# Patient Record
Sex: Female | Born: 1970 | Race: White | Hispanic: No | Marital: Married | Smoking: Current every day smoker
Health system: Southern US, Community
[De-identification: ages and names within clinical notes are randomized; demographics above are authoritative.]

## PROBLEM LIST (undated history)

## (undated) DIAGNOSIS — G43019 Migraine without aura, intractable, without status migrainosus: Secondary | ICD-10-CM

## (undated) DIAGNOSIS — I779 Disorder of arteries and arterioles, unspecified: Secondary | ICD-10-CM

## (undated) DIAGNOSIS — K219 Gastro-esophageal reflux disease without esophagitis: Secondary | ICD-10-CM

## (undated) DIAGNOSIS — R51 Headache: Secondary | ICD-10-CM

## (undated) DIAGNOSIS — I739 Peripheral vascular disease, unspecified: Principal | ICD-10-CM

## (undated) DIAGNOSIS — C539 Malignant neoplasm of cervix uteri, unspecified: Secondary | ICD-10-CM

## (undated) DIAGNOSIS — J40 Bronchitis, not specified as acute or chronic: Secondary | ICD-10-CM

## (undated) DIAGNOSIS — E039 Hypothyroidism, unspecified: Secondary | ICD-10-CM

## (undated) DIAGNOSIS — R519 Headache, unspecified: Secondary | ICD-10-CM

## (undated) DIAGNOSIS — G8929 Other chronic pain: Secondary | ICD-10-CM

## (undated) DIAGNOSIS — K274 Chronic or unspecified peptic ulcer, site unspecified, with hemorrhage: Secondary | ICD-10-CM

## (undated) DIAGNOSIS — D497 Neoplasm of unspecified behavior of endocrine glands and other parts of nervous system: Secondary | ICD-10-CM

## (undated) DIAGNOSIS — K284 Chronic or unspecified gastrojejunal ulcer with hemorrhage: Secondary | ICD-10-CM

## (undated) DIAGNOSIS — E785 Hyperlipidemia, unspecified: Secondary | ICD-10-CM

## (undated) DIAGNOSIS — I6521 Occlusion and stenosis of right carotid artery: Secondary | ICD-10-CM

## (undated) DIAGNOSIS — K279 Peptic ulcer, site unspecified, unspecified as acute or chronic, without hemorrhage or perforation: Secondary | ICD-10-CM

## (undated) DIAGNOSIS — I639 Cerebral infarction, unspecified: Secondary | ICD-10-CM

## (undated) DIAGNOSIS — G459 Transient cerebral ischemic attack, unspecified: Secondary | ICD-10-CM

## (undated) HISTORY — PX: ABDOMINAL HYSTERECTOMY: SHX81

## (undated) HISTORY — DX: Headache: R51

## (undated) HISTORY — DX: Malignant neoplasm of cervix uteri, unspecified: C53.9

## (undated) HISTORY — DX: Other chronic pain: G89.29

## (undated) HISTORY — DX: Disorder of arteries and arterioles, unspecified: I77.9

## (undated) HISTORY — DX: Neoplasm of unspecified behavior of endocrine glands and other parts of nervous system: D49.7

## (undated) HISTORY — DX: Peptic ulcer, site unspecified, unspecified as acute or chronic, without hemorrhage or perforation: K27.9

## (undated) HISTORY — DX: Peripheral vascular disease, unspecified: I73.9

## (undated) HISTORY — PX: OTHER SURGICAL HISTORY: SHX169

## (undated) HISTORY — PX: BLADDER SUSPENSION: SHX72

## (undated) HISTORY — DX: Migraine without aura, intractable, without status migrainosus: G43.019

## (undated) HISTORY — DX: Headache, unspecified: R51.9

## (undated) HISTORY — PX: TOTAL VAGINAL HYSTERECTOMY: SHX2548

## (undated) HISTORY — DX: Hyperlipidemia, unspecified: E78.5

---

## 2002-12-05 ENCOUNTER — Inpatient Hospital Stay (HOSPITAL_COMMUNITY): Admission: RE | Admit: 2002-12-05 | Discharge: 2002-12-07 | Payer: Self-pay | Admitting: Obstetrics & Gynecology

## 2003-02-06 ENCOUNTER — Ambulatory Visit (HOSPITAL_COMMUNITY): Admission: RE | Admit: 2003-02-06 | Discharge: 2003-02-06 | Payer: Self-pay | Admitting: Obstetrics & Gynecology

## 2004-05-27 ENCOUNTER — Ambulatory Visit: Payer: Self-pay | Admitting: Occupational Therapy

## 2007-01-17 ENCOUNTER — Ambulatory Visit (HOSPITAL_COMMUNITY): Payer: Self-pay | Admitting: Psychiatry

## 2007-01-24 ENCOUNTER — Ambulatory Visit (HOSPITAL_COMMUNITY): Payer: Self-pay | Admitting: Psychiatry

## 2007-02-02 ENCOUNTER — Ambulatory Visit (HOSPITAL_COMMUNITY): Payer: Self-pay | Admitting: Psychiatry

## 2007-02-10 ENCOUNTER — Ambulatory Visit (HOSPITAL_COMMUNITY): Payer: Self-pay | Admitting: Psychiatry

## 2007-03-04 ENCOUNTER — Ambulatory Visit (HOSPITAL_COMMUNITY): Payer: Self-pay | Admitting: Psychiatry

## 2007-04-26 ENCOUNTER — Ambulatory Visit (HOSPITAL_COMMUNITY): Payer: Self-pay | Admitting: Psychiatry

## 2007-05-03 ENCOUNTER — Ambulatory Visit (HOSPITAL_COMMUNITY): Payer: Self-pay | Admitting: Psychiatry

## 2007-05-05 ENCOUNTER — Ambulatory Visit (HOSPITAL_COMMUNITY): Payer: Self-pay | Admitting: Psychiatry

## 2007-06-30 ENCOUNTER — Ambulatory Visit (HOSPITAL_COMMUNITY): Payer: Self-pay | Admitting: Psychiatry

## 2007-07-05 ENCOUNTER — Ambulatory Visit (HOSPITAL_COMMUNITY): Payer: Self-pay | Admitting: Psychiatry

## 2007-07-07 ENCOUNTER — Ambulatory Visit (HOSPITAL_COMMUNITY): Payer: Self-pay | Admitting: Psychiatry

## 2007-07-14 ENCOUNTER — Ambulatory Visit (HOSPITAL_COMMUNITY): Payer: Self-pay | Admitting: Psychiatry

## 2007-07-27 ENCOUNTER — Ambulatory Visit (HOSPITAL_COMMUNITY): Payer: Self-pay | Admitting: Psychiatry

## 2007-08-11 ENCOUNTER — Ambulatory Visit (HOSPITAL_COMMUNITY): Payer: Self-pay | Admitting: Psychiatry

## 2007-08-12 ENCOUNTER — Ambulatory Visit (HOSPITAL_COMMUNITY): Payer: Self-pay | Admitting: Psychiatry

## 2007-10-11 ENCOUNTER — Ambulatory Visit (HOSPITAL_COMMUNITY): Payer: Self-pay | Admitting: Psychiatry

## 2008-02-14 ENCOUNTER — Ambulatory Visit (HOSPITAL_COMMUNITY): Payer: Self-pay | Admitting: Psychiatry

## 2008-03-15 ENCOUNTER — Ambulatory Visit (HOSPITAL_COMMUNITY): Payer: Self-pay | Admitting: Psychiatry

## 2010-02-11 HISTORY — PX: ESOPHAGOGASTRODUODENOSCOPY: SHX1529

## 2010-03-08 ENCOUNTER — Ambulatory Visit: Payer: Self-pay | Admitting: Gastroenterology

## 2010-03-08 ENCOUNTER — Telehealth: Payer: Self-pay | Admitting: Gastroenterology

## 2010-03-10 ENCOUNTER — Ambulatory Visit: Payer: Self-pay | Admitting: Gastroenterology

## 2010-03-20 ENCOUNTER — Inpatient Hospital Stay (HOSPITAL_COMMUNITY): Admission: EM | Admit: 2010-03-20 | Discharge: 2010-03-09 | Payer: Self-pay | Admitting: Emergency Medicine

## 2010-03-31 ENCOUNTER — Encounter (INDEPENDENT_AMBULATORY_CARE_PROVIDER_SITE_OTHER): Payer: Self-pay | Admitting: *Deleted

## 2010-04-28 ENCOUNTER — Encounter (INDEPENDENT_AMBULATORY_CARE_PROVIDER_SITE_OTHER): Payer: Self-pay | Admitting: *Deleted

## 2010-05-15 NOTE — Miscellaneous (Signed)
Summary: HISTORY & PHYSICAL  Clinical Lists Changes  NAMEMarland Kitchen  [name], [name]                  ACCOUNT NO.:  [id number]      MEDICAL RECORD NO.:  [id number]          PATIENT TYPE:  EMS      LOCATION:  ED                            FACILITY:  [facility]      PHYSICIAN:  Osvaldo Shipper, MD     DATE OF BIRTH:  [birth year]      DATE OF ADMISSION:  03/07/2010   DATE OF DISCHARGE:  LH                                 HISTORY          PRIMARY CARE PHYSICIAN:  Dr. Ninfa Linden with [facility].      ADMISSION DIAGNOSES:   1. Hematemesis.   2. Melena.      CHIEF COMPLAINT:  Vomiting blood and stomach pain.      HISTORY OF PRESENT ILLNESS:  The patient is a 39 year old Caucasian   female who was in her usual state of health until about 5a.m. this   morning when she woke up from sleep and felt sick to her stomach.  She   had a few episodes of emesis and then she noted that she was having   blood in her emesis.  She does not recall if she started with throwing   up blood or if the emesis became bloody after a few occasions.  She has   thrown up a total of about 30-40 mL of frank blood.  She has thrown up   about 9 times since 5 a.m. this morning.  She also was complaining of   stomach pain in the upper abdomen which is radiating throughout the   abdomen.  The pain is 8/10 in intensity.  It is a wringing-kind of pain   and then at about 1 p.m. this afternoon she had a solid black stool,   which was followed by liquid black stools since then.  She denies any   dizziness or lightheadedness.  She has never had similar symptoms in the   past.  She gets occasional acid reflux.  She takes about 2 to 3 Motrin   every week.  Denies any symptoms suggestive of dysphagia.  Denies any   weight loss.  She does have 2 Pepsi on a daily basis.      MEDICATION USE:   1. She is on estrogen hormone replacement every day.   2. She is on fenofibrate 200 mg daily.   3. Motrin, as I mentioned above.      ALLERGIES:  Include SULFA and PENICILLIN.  PENICILLIN causes a chain.      PAST MEDICAL HISTORY:  Positive for hypercholesterolemia.      SURGICAL HISTORY:  Includes total vaginal hysterectomy and bilateral   salpingo-oophorectomy.  This was in 2004.  She has had bladder sling   surgery.  She has had cysts removed from her vocal cord and from pelvic   area, and from her back.  Etiology is unclear.  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  SOCIAL HISTORY:  She lives in [place] with her family.  Smokes half   a pack of cigarettes on a daily basis.  Drinks alcohol very socially,   not on a daily basis.  Denies any illicit drug use.      FAMILY HISTORY:  Positive for mother who died of COPD.  Father died of   coronary artery disease.  She has an aunt wheeze had lung cancer.   Another aunt had stomach cancer.      REVIEW OF SYSTEMS:  General:  System positive for weakness, malaise.   HEENT:  Unremarkable.  Cardiovascular:  Unremarkable.  Respiratory:   Unremarkable.  GI: As in HPI.  GU: Unremarkable.  Neurologic:   Unremarkable.  Psychiatric: Unremarkable.  Dermatological: Unremarkable   other systems reviewed and found to be negative.      PHYSICAL EXAMINATION:  VITAL SIGNS:  Temperature 98.2, blood pressure   122/78, heart rate 70, respiratory 20, saturation 98% on room air.   GENERAL:  This is a thin white female in no distress.   HEENT: Head is normocephalic, atraumatic.  Pupils are equal reacting.   No pallor, no icterus.  Oral mucous membranes slightly dry.  No oral   lesions are noted.   NECK:  Soft and supple.  No thyromegaly is appreciated.  No cervical,   supraclavicular or inguinal lymphadenopathy is present.   LUNGS:  Clear to auscultation bilaterally with no wheezing, rales or   rhonchi.   CARDIOVASCULAR:  S1, S2 is normal regular.  No S3-S4 or murmurs or   bruits.   ABDOMEN:  Soft.  There is tenderness in the epigastric area without any   rebound  rigidity or guarding.  No masses or organomegaly appreciated.   Bowel sounds are present.   GU: Deferred.   MUSCULOSKELETAL:  Normal muscle mass and tone.   NEUROLOGICAL:  She is alert, oriented x3.  No focal neurological   deficits are present.   SKIN:  Does not reveal any rashes.      LAB DATA:  Her white cell count is 13.1, hemoglobin is 16.5, MCV 92,   platelet count 349,000.  Coags are normal.  Electrolytes normal, AST 38.   Beta HCG is negative.  UA did not show any infection did show trace   blood.  Acute abdominal series is pending at this time.      ASSESSMENT:  This is a 40 year old Caucasian female who presents with   hematemesis and melena.  Differential diagnosis include peptic ulcer   disease, cancer, et Karie Soda.      PLAN:   1. Hematemesis/melena.  We will give her PPI infusion admit her to the       ICU.  Follow up on the acute abdominal series.  If there is any       abnormality on this x-ray or if her pain does not improve a CAT       scan may have to be considered.  Hopefully this is just pain from       the lesion that could be in the stomach.  I have already spoken to       Dr. Jonette Eva about this and she will see this patient the       morning and hopefully she will undergo the endoscopy in the       morning.  We will keep her n.p.o. for now.   2. Deep vein thrombosis prophylaxis using SCDs.   3. Further management  decisions will depend on results of further       testing and patient's response to treatment.         Osvaldo Shipper, MD               GK/MEDQ  D:  03/08/2010  T:  03/08/2010  Job:  [id number]      cc:   Jonette Eva, M.D.   [address]   [place] , [place] [address]      Dr. Ninfa Linden      Electronically Signed by Osvaldo Shipper MD on 03/30/2010 98:11:91 PM

## 2010-05-15 NOTE — Progress Notes (Signed)
Summary: PUD, EGD NOV 2011  Pt admitted with melena and hematemesis 2o to PUD. OPV in 4 mos. [place] MD  March 08, 2010 4:51 PM  Appended Document: PUD, EGD NOV 2011 reminder in computer

## 2010-05-15 NOTE — Miscellaneous (Signed)
Summary: CONSULTATION  Clinical Lists Changes  NAMEBRINLEY, [name]                  ACCOUNT NO.:  [id number]      MEDICAL RECORD NO.:  [id number]          PATIENT TYPE:  INP      LOCATION:  A232                          FACILITY:  [facility]      PHYSICIAN:  Jonette Eva, M.D.     DATE OF BIRTH:  Mar 29, 1971      DATE OF CONSULTATION:  03/08/2010   DATE OF DISCHARGE:                                    CONSULTATION         RANGE OF CONSULTATION:  Melena, hematemesis.      HISTORY OF PRESENT ILLNESS:  [name] is a 40 year old female who   regularly uses ibuprofen.  She reports having upper abdominal pain off   and on for a couple of days.  On Friday she began to have persistent   pain in the  middle of her abdomen and upper stomach and so she came to   the emergency room.  Her pain was associated with  black stools around   1:30 p.m. yesterday.  She reports having 4 episodes.  She had 4 episodes   of vomiting blood at her house.  She has not had any episodes of   hematemesis here.  She states that when she has vomited she has seen   black substance in her emesis.  She uses ibuprofen, but not every day.   She may use 4 over-the-counter ibuprofen 2 to 3 times a week.  She has   not been on a proton pump inhibitor.  She denies any heartburn,   indigestion, problems swallowing, chest pain or shortness of breath.   She last vomited slightly after midnight.  Her last black stool was   early this morning.      PAST MEDICAL HISTORY:  Hyperlipidemia.      PAST SURGICAL HISTORY:   1. Hysterectomy.   2. Bilateral salpingo-oophorectomy.      ALLERGIES:  SULFA AND PENICILLIN.      MEDICATIONS:   1. Dilaudid as needed.   2. Zofran as needed.   3. Protonix drip.      FAMILY HISTORY:  She denies any family history of colon cancer or colon   polyps.      SOCIAL HISTORY:  She does smoke half a pack a day.  She drinks alcohol 4   to 5 times a month.  She denies any recreational drug use.      REVIEW OF SYSTEMS:  Per the HPI, otherwise all systems are negative.      PHYSICAL EXAMINATION:  VITAL SIGNS:  T max 97.6,  hemodynamically   stable.   GENERAL:  She is in no apparent distress, alert and oriented x4.   HEENT:  Exam is atraumatic, normocephalic.   NECK:  No lymphadenopathy.   LUNGS:  Clear to auscultation bilaterally.   CARDIOVASCULAR:  Regular rhythm, normal S1-S2.   ABDOMEN:  Bowel sounds are present, soft, nontender, nondistended.  No   rebound or guarding.   EXTREMITIES:  No cyanosis or edema.  NEUROLOGIC:  She has no focal neurologic deficit.      LABORATORY DATA:  White blood cell count 13.1 and 11.3, hemoglobin 16.5   to  14.2,. platelets 349 to 320, lipase 41.  UA negative.  Creatinine   0.85.      RADIOGRAPHIC STUDIES:  She had a CT scan of the abdomen and pelvis with   IV contrast which revealed cyst in the liver too small to characterize.   The gallbladder was normal and pancreas was normal.  The second portion   of the duodenum had focal thickening in the duodenal wall.  Her appendix   was normal.      ASSESSMENT:  [name] is a 40 year old female who presents with melena   and hematemesis.  She most likely has peptic ulcer disease.  The   differential diagnosis includes Dieulafoy's lesion, less likely gastric   cancer, esophageal cancer or Helicobacter pylori.      Thank you for allowing me to see [name] in consultation.  My   recommendations follow.      RECOMMENDATIONS:   1. The patient may have a clear liquid diet.   2. Plan EGD at 4:00 p.m. today.   3. Continue Protonix drip.   4. Avoid aspirin, NSAIDs and anticoagulation.               Jonette Eva, M.D.               SF/MEDQ  D:  03/08/2010  T:  03/08/2010  Job:  [id number]      cc:   [facility]      Electronically Signed by Jonette Eva M.D. on 04/24/2010 06:19:34 PM

## 2010-05-30 ENCOUNTER — Emergency Department (HOSPITAL_COMMUNITY): Payer: BC Managed Care – PPO

## 2010-05-30 ENCOUNTER — Emergency Department (HOSPITAL_COMMUNITY)
Admission: EM | Admit: 2010-05-30 | Discharge: 2010-05-30 | Disposition: A | Discharge status: Home / Self Care | Payer: BC Managed Care – PPO | Attending: Emergency Medicine | Admitting: Emergency Medicine

## 2010-05-30 DIAGNOSIS — R51 Headache: Secondary | ICD-10-CM | POA: Insufficient documentation

## 2010-06-05 ENCOUNTER — Other Ambulatory Visit: Payer: Self-pay | Admitting: Neurology

## 2010-06-05 DIAGNOSIS — H532 Diplopia: Secondary | ICD-10-CM

## 2010-06-09 ENCOUNTER — Ambulatory Visit (HOSPITAL_COMMUNITY)
Admission: RE | Admit: 2010-06-09 | Discharge: 2010-06-09 | Disposition: A | Discharge status: Home / Self Care | Payer: BC Managed Care – PPO | Source: Ambulatory Visit | Attending: Neurology | Admitting: Neurology

## 2010-06-09 DIAGNOSIS — R51 Headache: Secondary | ICD-10-CM | POA: Insufficient documentation

## 2010-06-09 DIAGNOSIS — H532 Diplopia: Secondary | ICD-10-CM | POA: Insufficient documentation

## 2010-06-09 DIAGNOSIS — H538 Other visual disturbances: Secondary | ICD-10-CM | POA: Insufficient documentation

## 2010-06-09 MED ORDER — GADOBENATE DIMEGLUMINE 529 MG/ML IV SOLN
10.0000 mL | Freq: Once | INTRAVENOUS | Status: AC | PRN
  Administered 2010-06-09: 10 mL via INTRAVENOUS

## 2010-06-23 ENCOUNTER — Ambulatory Visit: Payer: BC Managed Care – PPO | Admitting: Gastroenterology

## 2010-06-24 LAB — URINALYSIS, ROUTINE W REFLEX MICROSCOPIC
Glucose, UA: NEGATIVE mg/dL
Specific Gravity, Urine: >1.03 — ABNORMAL HIGH (ref 1.005–1.030)

## 2010-06-24 LAB — CBC
HCT: 38.7 % (ref 36.0–46.0)
HCT: 40.8 % (ref 36.0–46.0)
HCT: 42.1 % (ref 36.0–46.0)
Hemoglobin: 13.4 g/dL (ref 12.0–15.0)
Hemoglobin: 13.9 g/dL (ref 12.0–15.0)
Hemoglobin: 14.2 g/dL (ref 12.0–15.0)
MCH: 31.6 pg (ref 26.0–34.0)
MCH: 32.3 pg (ref 26.0–34.0)
MCHC: 34.2 g/dL (ref 30.0–36.0)
MCHC: 34.6 g/dL (ref 30.0–36.0)
MCV: 94.7 fL (ref 78.0–100.0)
Platelets: 320 10*3/uL (ref 150–400)
Platelets: 349 10*3/uL (ref 150–400)
RBC: 4.24 MIL/uL (ref 3.87–5.11)
RBC: 4.3 MIL/uL (ref 3.87–5.11)
RBC: 4.43 MIL/uL (ref 3.87–5.11)
RDW: 13.1 % (ref 11.5–15.5)
RDW: 13.2 % (ref 11.5–15.5)
WBC: 10.3 10*3/uL (ref 4.0–10.5)
WBC: 11.5 10*3/uL — ABNORMAL HIGH (ref 4.0–10.5)
WBC: 13.1 10*3/uL — ABNORMAL HIGH (ref 4.0–10.5)

## 2010-06-24 LAB — DIFFERENTIAL
Basophils Absolute: 0.1 10*3/uL (ref 0.0–0.1)
Basophils Absolute: 0.1 10*3/uL (ref 0.0–0.1)
Basophils Relative: 1 % (ref 0–1)
Basophils Relative: 1 % (ref 0–1)
Eosinophils Absolute: 0.2 10*3/uL (ref 0.0–0.7)
Eosinophils Absolute: 0.3 10*3/uL (ref 0.0–0.7)
Eosinophils Relative: 2 % (ref 0–5)
Lymphocytes Relative: 35 % (ref 12–46)
Lymphocytes Relative: 43 % (ref 12–46)
Lymphs Abs: 3.9 10*3/uL (ref 0.7–4.0)
Monocytes Absolute: 0.7 10*3/uL (ref 0.1–1.0)
Monocytes Absolute: 0.8 10*3/uL (ref 0.1–1.0)
Monocytes Absolute: 0.9 10*3/uL (ref 0.1–1.0)
Monocytes Relative: 6 % (ref 3–12)
Monocytes Relative: 7 % (ref 3–12)
Monocytes Relative: 7 % (ref 3–12)
Neutro Abs: 4.8 10*3/uL (ref 1.7–7.7)
Neutro Abs: 6 10*3/uL (ref 1.7–7.7)
Neutro Abs: 6.4 10*3/uL (ref 1.7–7.7)
Neutro Abs: 8.5 10*3/uL — ABNORMAL HIGH (ref 1.7–7.7)
Neutrophils Relative %: 47 % (ref 43–77)
Neutrophils Relative %: 56 % (ref 43–77)

## 2010-06-24 LAB — COMPREHENSIVE METABOLIC PANEL
ALT: 21 U/L (ref 0–35)
ALT: 28 U/L (ref 0–35)
AST: 32 U/L (ref 0–37)
Albumin: 4.4 g/dL (ref 3.5–5.2)
Alkaline Phosphatase: 73 U/L (ref 39–117)
CO2: 24 mEq/L (ref 19–32)
Chloride: 104 mEq/L (ref 96–112)
GFR calc Af Amer: >60 mL/min (ref >60)
GFR calc Af Amer: >60 mL/min (ref >60)
GFR calc non Af Amer: >60 mL/min (ref >60)
Potassium: 4.1 mEq/L (ref 3.5–5.1)
Sodium: 137 mEq/L (ref 135–145)
Sodium: 139 mEq/L (ref 135–145)
Total Bilirubin: 0.6 mg/dL (ref 0.3–1.2)
Total Protein: 8.1 g/dL (ref 6.0–8.3)

## 2010-06-24 LAB — APTT: aPTT: 31 seconds (ref 24–37)

## 2010-06-24 LAB — URINE MICROSCOPIC-ADD ON

## 2010-06-24 LAB — BASIC METABOLIC PANEL
BUN: 4 mg/dL — ABNORMAL LOW (ref 6–23)
Calcium: 8.5 mg/dL (ref 8.4–10.5)
Creatinine, Ser: 0.85 mg/dL (ref 0.4–1.2)
GFR calc non Af Amer: >60 mL/min (ref >60)
Potassium: 3.8 mEq/L (ref 3.5–5.1)

## 2010-06-24 LAB — MRSA PCR SCREENING: MRSA by PCR: NEGATIVE

## 2010-06-24 LAB — LIPASE, BLOOD: Lipase: 41 U/L (ref 11–59)

## 2010-07-03 ENCOUNTER — Other Ambulatory Visit (HOSPITAL_COMMUNITY): Payer: BC Managed Care – PPO

## 2010-07-03 ENCOUNTER — Ambulatory Visit (HOSPITAL_COMMUNITY): Payer: BC Managed Care – PPO

## 2010-07-03 ENCOUNTER — Encounter (HOSPITAL_COMMUNITY): Payer: BC Managed Care – PPO | Attending: Oncology

## 2010-07-03 DIAGNOSIS — R799 Abnormal finding of blood chemistry, unspecified: Secondary | ICD-10-CM | POA: Insufficient documentation

## 2010-07-03 DIAGNOSIS — D473 Essential (hemorrhagic) thrombocythemia: Secondary | ICD-10-CM

## 2010-07-03 DIAGNOSIS — K279 Peptic ulcer, site unspecified, unspecified as acute or chronic, without hemorrhage or perforation: Secondary | ICD-10-CM

## 2010-07-09 ENCOUNTER — Other Ambulatory Visit (HOSPITAL_COMMUNITY): Payer: BC Managed Care – PPO

## 2010-07-09 ENCOUNTER — Encounter (HOSPITAL_COMMUNITY): Payer: BC Managed Care – PPO

## 2010-07-09 DIAGNOSIS — D473 Essential (hemorrhagic) thrombocythemia: Secondary | ICD-10-CM

## 2010-07-16 ENCOUNTER — Ambulatory Visit (INDEPENDENT_AMBULATORY_CARE_PROVIDER_SITE_OTHER): Payer: BC Managed Care – PPO | Admitting: Gastroenterology

## 2010-07-16 ENCOUNTER — Encounter: Payer: Self-pay | Admitting: Gastroenterology

## 2010-07-16 VITALS — BP 124/86 | HR 81 | Temp 98.0°F | Ht 63.0 in | Wt 112.8 lb

## 2010-07-16 DIAGNOSIS — K279 Peptic ulcer, site unspecified, unspecified as acute or chronic, without hemorrhage or perforation: Secondary | ICD-10-CM | POA: Insufficient documentation

## 2010-07-16 DIAGNOSIS — K921 Melena: Secondary | ICD-10-CM | POA: Insufficient documentation

## 2010-07-16 LAB — ANEMIA PANEL
%SAT: 34
Ferritin: 76 ng/mL (ref 9.0–150.0)

## 2010-07-16 LAB — POC HEMOCCULT BLD/STL (OFFICE/1-CARD/DIAGNOSTIC): Fecal Occult Blood, POC: NEGATIVE

## 2010-07-16 MED ORDER — ESOMEPRAZOLE MAGNESIUM 40 MG PO CPDR: 40.0000 mg | DELAYED_RELEASE_CAPSULE | Freq: Every day | ORAL | Status: DC

## 2010-07-16 NOTE — Assessment & Plan Note (Signed)
History of peptic ulcer disease related to NSAID use. GI bleeding related to this back in November 2011. EGD findings as outlined above. She continues to use ibuprofen regularly. No longer on a PPI. She is having upper GI symptoms again. Discussed with Dr. Darrick Penna. EGD tomorrow. I started her on Nexium 40 mg daily. #10 samples and prescription provided. I have discussed the risks, alternatives, benefits with regards to but not limited to the risk of reaction to medication, bleeding, infection, perforation and the patient is agreeable to proceed. Written consent to be obtained.

## 2010-07-16 NOTE — Progress Notes (Signed)
Primary Care Physician:  Ninfa Linden, NP  Chief Complaint  Patient presents with  . Other    stomach ulcers, bleeding    HPI:  [name] is a 40 y.o. female here for further evaluation of history of peptic ulcer disease. She states that Dr. Hilbert Bible recommended she come back to see [place]. She states that he's concerned that she may have ongoing peptic ulcer disease with bleeding. She is being evaluated by him for thrombocytosis and leukocytosis per her report. Recent labs from his office from 07/03/2010 her white count of 12,200, hemoglobin 14.4, platelet count 425,000, iron 108, TIBC 320, iron saturation is 34%, ferritin 76. Since her hospitalization for peptic ulcer disease and hematemesis last year, she continues to use ibuprofen when necessary for headaches. She generally takes Tylenol first if that doesn't work she'll take ibuprofen up to 400 mg at a time. She states she never takes more than 4 in the day at this day she does take 2. Denies any other NSAID or aspirin use. She notes her last several days she's had increased abdominal bloating just like before her peptic ulcer disease last year. This morning she states her stools very dark if not black. She's had some abdominal pain. Her appetite is diminished. She notes she has had recent changes in her cholesterol medication and hormone replacement and thought her appetite change was related to that. She has a bowel movement every day. Denies any rectal bleeding. Denies heartburn or dysphagia. She states she has not been on a PPI quite some time.  Recent vision changes (double vision) for two months. Cleared up but now back for two weeks. Lots of testing. Seen by neurologist (Dr. Gerilyn Pilgrim). Spinal tap done. MRI ruled out MS. Plans to see a different neurologist for second opinion.   Current Outpatient Prescriptions  Medication Sig Dispense Refill  . atorvastatin (LIPITOR) 20 MG tablet Take 20 mg by mouth daily.        Marland Kitchen estradiol  (VIVELLE-DOT) 0.075 MG/24HR Place 1 patch onto the skin 2 (two) times a week.        . fenofibrate micronized (LOFIBRA) 200 MG capsule Take 200 mg by mouth daily before breakfast.        . ibuprofen (ADVIL,MOTRIN) 200 MG tablet Take 200 mg by mouth every 6 (six) hours as needed.        Marland Kitchen esomeprazole (NEXIUM) 40 MG capsule Take 1 capsule (40 mg total) by mouth daily before breakfast.  30 capsule  11    Allergies as of 07/16/2010 - Review Complete 07/16/2010  Allergen Reaction Noted  . Penicillins Itching 07/16/2010  . Protonix Itching 07/16/2010  . Sulfa antibiotics Itching 07/16/2010    Past Medical History  Diagnosis Date  . Hyperlipidemia   . Cervical cancer   . PUD (peptic ulcer disease)     NSAIDS     Past Surgical History  Procedure Date  . Total vaginal hysterectomy   . Bladder suspension   . Esophagogastroduodenoscopy 02/2010    Dr. Jonette Eva. Multiple duodenal ulcers, gastric erosions (bx negative for H.Pylori)    Family History  Problem Relation Age of Onset  . Diabetes Mother   . COPD Mother   . Heart disease Father   . Cervical cancer Sister   . Colon cancer Neg Hx   . Colon polyps Neg Hx     History   Social History  . Marital Status: Married    Spouse Name: N/A  Number of Children: N/A  . Years of Education: N/A   Occupational History  . unemployed    Social History Main Topics  . Smoking status: Current Everyday Smoker -- 1.0 packs/day for 25 years    Types: Cigarettes  . Smokeless tobacco: Never Used   Comment: wellbutrin worked before  . Alcohol Use: 8.4 - 16.8 oz/week    14-28 Cans of beer per week     drinks 2-4 beers a day  . Drug Use: No  . Sexually Active: Yes -- Female partner(s)    Birth Control/ Protection: Surgical     spouse   Other Topics Concern  . Not on file   Social History Narrative  . No narrative on file      ROS:  General: Negative for anorexia, weight loss, fever, chills, fatigue, weakness. Eyes:  Positive for vision changes.  ENT: Negative for hoarseness, difficulty swallowing , nasal congestion. CV: Negative for chest pain, angina, palpitations, dyspnea on exertion, peripheral edema.  Respiratory: Negative for dyspnea at rest, dyspnea on exertion, cough, sputum, wheezing.  GI: See history of present illness. GU:  Negative for dysuria, hematuria, urinary incontinence, urinary frequency, nocturnal urination.  MS: Negative for joint pain, low back pain.  Derm: Negative for rash or itching.  Neuro: Negative for weakness, abnormal sensation, seizure, frequent headaches, memory loss, confusion.  Psych: Negative for anxiety, depression, suicidal ideation, hallucinations.  Endo: Negative for unusual weight change.  Heme: Negative for bruising or bleeding. Allergy: Negative for rash or hives.    Physical Examination: BP 124/86  Pulse 81  Temp 98 F (36.7 C)  Ht 5\' 3"  (1.6 m)  Wt 112 lb 12 oz (51.143 kg)  BMI 19.97 kg/m2  SpO2 98%   General: Well-nourished, well-developed in no acute distress.  Head: Normocephalic, atraumatic.   Eyes: Conjunctiva pink, no icterus. Mouth: Oropharyngeal mucosa moist and pink , no lesions erythema or exudate. Neck: Supple without thyromegaly, masses, or lymphadenopathy.  Lungs: Clear to auscultation bilaterally.  Heart: Regular rate and rhythm, no murmurs rubs or gallops.  Abdomen: Bowel sounds are normal, moderate ruq/epigastric tenderness, nondistended, no hepatosplenomegaly or masses, no abdominal bruits or    hernia , no rebound or guarding.   Rectal: No masses. No ext lesions. Brown secretions, heme negative. Extremities: No lower extremity edema.  Neuro: Alert and oriented x 4 , grossly normal neurologically.  Skin: Warm and dry, no rash or jaundice.   Psych: Alert and cooperative, normal mood and affect.

## 2010-07-16 NOTE — Patient Instructions (Signed)
Upper endoscopy to further evaluate your symptoms, follow-up on ulcers. Stop all NSAIDS including ibuprofen. Start Nexium, one daily. RX and samples given.

## 2010-07-17 ENCOUNTER — Encounter: Payer: BC Managed Care – PPO | Admitting: Gastroenterology

## 2010-07-17 ENCOUNTER — Ambulatory Visit (HOSPITAL_COMMUNITY)
Admission: RE | Admit: 2010-07-17 | Discharge: 2010-07-17 | Disposition: A | Discharge status: Home / Self Care | Payer: BC Managed Care – PPO | Source: Ambulatory Visit | Attending: Gastroenterology | Admitting: Gastroenterology

## 2010-07-17 ENCOUNTER — Other Ambulatory Visit: Payer: Self-pay | Admitting: Gastroenterology

## 2010-07-17 DIAGNOSIS — K2990 Gastroduodenitis, unspecified, without bleeding: Secondary | ICD-10-CM

## 2010-07-17 DIAGNOSIS — K2970 Gastritis, unspecified, without bleeding: Secondary | ICD-10-CM

## 2010-07-17 DIAGNOSIS — R1011 Right upper quadrant pain: Secondary | ICD-10-CM

## 2010-07-17 DIAGNOSIS — E785 Hyperlipidemia, unspecified: Secondary | ICD-10-CM | POA: Insufficient documentation

## 2010-07-17 NOTE — Progress Notes (Signed)
Pt continues to use Ibuprofen in spite of being to avoid Ibuprofen and Naproxen indefinitely. EGD today. Continue PPI. Avoid Etoh.

## 2010-07-21 NOTE — Op Note (Signed)
Tonya, Dixon                  ACCOUNT NO.:  [id number]  MEDICAL RECORD NO.:  [id number]           PATIENT TYPE:  O  LOCATION:  DAYP                          FACILITY:  [facility]  PHYSICIAN:  Jonette Eva, M.D.     DATE OF BIRTH:  1971-03-24  DATE OF PROCEDURE:  07/17/2010 DATE OF DISCHARGE:                              OPERATIVE REPORT   PRIMARY PROVIDER:  Ninfa Linden.  PROCEDURE:  Esophagogastroduodenoscopy with cold forceps biopsy of the gastric mucosa.  INDICATION FOR EXAM:  [name] is a 40 year old female who was initially seen by me in November 2011 when she presented with melena and hematemesis.  She was using ibuprofen and was not on a PPI.  She was having black tarry stools.  Her presenting hemoglobin was 16.5 and she was discharged with a hemoglobin of 13.9.  Her platelet count was normal, and her liver enzymes were normal.  EGD was performed and revealed multiple gastric erosions in the body and antrum and multiple linear duodenal ulcers.  She had one clean based ulcer and it had no stigmata of bleeding.  She was asked to take a PPI for 3 months and then daily.  She was also asked to avoid naproxen and ibuprofen indefinitely. She was seen and evaluated by Dr. Mariel Sleet for thrombocytosis per her report.  In March 2012, she had a hemoglobin of 14.4 and a platelet count of 425.  Her ferritin was 76.  She has continued to use ibuprofen over the last 6 weeks.  It increased her abdominal bloating, and she was complaining of some right upper quadrant pain.  She denies any black tarry stools or hematemesis.  She has had no bright red blood per rectum.  She was not taking her PPI.  After her clinic visit, she was prescribed Nexium daily.  FINDINGS: 1. Normal esophagus without evidence of Barrett's mass, erosion,     ulceration, or stricture. 2. Mild patchy erythema without erosion or ulcerations in the antrum.     Biopsies obtained via cold forceps evaluate for H.  pylori     gastritis. 3. Patchy erythema and edema and duodenal bulb at the junction of D1     and D2.  Unable to appreciate any ulcer.  No old blood or fresh     blood seen in the stomach or the duodenum.  DIAGNOSES:  Abdominal bloating and pain most likely secondary to NSAID induced dyspepsia.  She could also have a NSAID small bowel enteropathy.  RECOMMENDATIONS: 1. I would not pursue additional GI evaluation unless she manifests     evidence of black tarry stools, bright red blood per rectum, or     iron deficiency anemia. 2. She should follow up in 4 months regarding her dyspepsia. 3. She should avoid alcohol and avoid ibuprofen and naproxen.  I     explained this to her prior to sedation.  She should find     alternatives, NSAID subclass. 4. She was given a handout on gastritis.  She should continue her     Nexium.  MEDICATIONS: 1. Demerol 100 mg IV. 2.  Fentanyl 25 mcg IV. 3. Versed 6 mg IV. 4. Phenergan 25 mg IV.  PROCEDURE TECHNIQUE:  Physical exam was performed.  Informed consent was obtained from the patient after explaining benefits, risks, and alternatives to procedure.  The patient was connected to the monitor and placed in left lateral position.  Continuous oxygen was provided by nasal cannula and IV medicine administered through an indwelling cannula.  After administration of Demerol and Phenergan, she began to have red streaks on her right hand and forearm.  Her IV was discontinued and warm compresses were applied.  The IV was started in the left upper extremity.  After administration of adequate sedation, the patient's esophagus was intubated.  Scope advanced under direct visualization to the second portion of the duodenum.  The scope was removed slowly by careful examining the color, texture, anatomy, and integrity of the mucosa on the way out.  The patient was recovered in endoscopy, discharged home in satisfactory condition.  I did call Sabra Sessler at  [phone] to discuss her results.  PATH: Please call pt. She does not have H. Pylori gastritis. Avoid Ibuprofen and EtOH.   Jonette Eva, M.D.     SF/MEDQ  D:  07/17/2010  T:  07/18/2010  Job:  [id number]  cc:   Ninfa Linden  Electronically Signed by Jonette Eva M.D. on 07/21/2010 12:41:45 PM

## 2010-07-22 ENCOUNTER — Encounter: Payer: Self-pay | Admitting: Gastroenterology

## 2010-07-24 ENCOUNTER — Encounter (HOSPITAL_COMMUNITY): Payer: BC Managed Care – PPO | Admitting: Oncology

## 2010-07-24 ENCOUNTER — Encounter (HOSPITAL_COMMUNITY): Payer: BC Managed Care – PPO | Attending: Oncology

## 2010-07-24 ENCOUNTER — Other Ambulatory Visit (HOSPITAL_COMMUNITY): Payer: Self-pay | Admitting: Oncology

## 2010-07-24 DIAGNOSIS — Z139 Encounter for screening, unspecified: Secondary | ICD-10-CM

## 2010-07-24 DIAGNOSIS — R799 Abnormal finding of blood chemistry, unspecified: Secondary | ICD-10-CM | POA: Insufficient documentation

## 2010-07-24 DIAGNOSIS — D473 Essential (hemorrhagic) thrombocythemia: Secondary | ICD-10-CM

## 2010-07-29 ENCOUNTER — Ambulatory Visit (HOSPITAL_COMMUNITY)
Admission: RE | Admit: 2010-07-29 | Discharge: 2010-07-29 | Disposition: A | Discharge status: Home / Self Care | Payer: BC Managed Care – PPO | Source: Ambulatory Visit | Attending: Oncology | Admitting: Oncology

## 2010-07-29 DIAGNOSIS — R7989 Other specified abnormal findings of blood chemistry: Secondary | ICD-10-CM | POA: Insufficient documentation

## 2010-07-29 DIAGNOSIS — Z139 Encounter for screening, unspecified: Secondary | ICD-10-CM

## 2010-07-29 DIAGNOSIS — J984 Other disorders of lung: Secondary | ICD-10-CM | POA: Insufficient documentation

## 2010-07-29 DIAGNOSIS — F17200 Nicotine dependence, unspecified, uncomplicated: Secondary | ICD-10-CM | POA: Insufficient documentation

## 2010-07-31 ENCOUNTER — Telehealth: Payer: Self-pay

## 2010-07-31 NOTE — Telephone Encounter (Signed)
Pt called and said she cannot afford the $50.00 co pay on the Nexium (that is her cost with the savings card). Said she is allergic to Protonix, causes hives. Please advise! Uses [facility] in [place].

## 2010-07-31 NOTE — Telephone Encounter (Signed)
LMOM for pt to call. # 30 samples of Nexium at front for pick-up.

## 2010-07-31 NOTE — Telephone Encounter (Signed)
Pt was informed. She is wanting a Rx for Prilosec 40 mg since she cannot buy 40mg  tablets otc. Please advise!

## 2010-07-31 NOTE — Telephone Encounter (Signed)
We can give samples x 1 mo if we have. Unfortunately, since allergic to protonix, not a lot of other options (ie Dexilant). Copay for most PPIs is going to be $40-50/month.

## 2010-08-05 MED ORDER — OMEPRAZOLE 40 MG PO CPDR: 40.0000 mg | DELAYED_RELEASE_CAPSULE | Freq: Every day | ORAL | Status: DC

## 2010-08-05 NOTE — Telephone Encounter (Signed)
Rx sent.  Call if any problems.

## 2010-08-05 NOTE — Telephone Encounter (Signed)
Sending this note to [facility] for the last communication note.

## 2010-08-05 NOTE — Telephone Encounter (Deleted)
What pharmacy ?

## 2010-08-05 NOTE — Telephone Encounter (Signed)
LMOM that Rx was sent in to pharmacy, call if any problems.

## 2010-08-07 ENCOUNTER — Ambulatory Visit (HOSPITAL_COMMUNITY): Payer: BC Managed Care – PPO

## 2010-08-14 ENCOUNTER — Encounter (HOSPITAL_COMMUNITY): Payer: BC Managed Care – PPO

## 2010-08-14 ENCOUNTER — Ambulatory Visit (HOSPITAL_COMMUNITY): Payer: BC Managed Care – PPO

## 2010-08-15 ENCOUNTER — Ambulatory Visit (HOSPITAL_COMMUNITY): Payer: BC Managed Care – PPO | Admitting: Oncology

## 2010-08-15 ENCOUNTER — Encounter (HOSPITAL_COMMUNITY): Payer: BC Managed Care – PPO

## 2010-08-29 NOTE — H&P (Signed)
[name], [name]                            ACCOUNT NO.:  [id number]   MEDICAL RECORD NO.:  [id number]                   PATIENT TYPE:  AMB   LOCATION:  DAY                                  FACILITY:  [facility]   PHYSICIAN:  Lazaro Arms, M.D.                DATE OF BIRTH:  1970-08-20   DATE OF ADMISSION:  12/05/2002  DATE OF DISCHARGE:                                HISTORY & PHYSICAL   HISTORY OF PRESENT ILLNESS:  [name] is a 40 year old white female, gravida 3,  para 3, who is using NuvaRing continuously.  She presented to me originally  on Aug 14, 2002, complaining of severe dysmenorrhea and dyspareunia.  The  patient also complains of losing urine three to four times a week with  coughing, sneezing, and laughing.  The patient also has lateral pelvic pain  on both sides just before and central pelvic pain with her menses.  Back in  May, we placed a NuvaRing and gave her Bextra 20 mg b.i.d.  We used the  NuvaRing continuously, which stopped her bleeding, but has not stopped her  dyspareunia.  She is also having hot flushes and diminished libido.  Because  of her stress incontinence symptoms, we did urodynamic testing, which was  essentially normal.  She did not have any detrusor instability.  Her flow  rates were normal.  Her urethral pressure profile was normal at 65 cm of  water.  I was unable to get her to lose urine with any provocative  activities.  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  If we fix her cystocele without doing a sling, I am afraid she would start  having symptoms.  As a result, she is admitted for TVH, BSO, and anterior  colporrhaphy with a Pelvicol graft and a retropubic sling.  The patient  understands the indications for the surgery and will proceed.   PAST MEDICAL HISTORY:  Negative.   PAST SURGICAL HISTORY:  Negative.   PAST OBSTETRICAL HISTORY:  Three vaginal deliveries.   MEDICATIONS:  NuvaRing.   ALLERGIES:  1. SULFA.  2.  PENICILLIN.   SOCIAL HISTORY:  She quit smoking about seven months ago.  She drinks  socially.  No drugs.   REVIEW OF SYSTEMS:  Otherwise negative.   PHYSICAL EXAMINATION:  WEIGHT:  115 pounds.  VITAL SIGNS:  Blood pressure 110/80.  HEENT:  Unremarkable.  NECK:  The thyroid is normal.  LUNG:  Clear.  HEART:  Regular rate and rhythm without murmur, rub, or gallop.  BREASTS:  Without mass, discharge, or skin changes.  ABDOMEN:  Benign with no hepatosplenomegaly or masses.  PELVIC:  She has normal external genitalia.  The vagina is clean without  discharge.  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  There is  some uterine prolapse.  She does have tenderness to palpation with the  uterus and adnexa.  EXTREMITIES:  Warm with no edema.  NEUROLOGIC:  Grossly intact.   IMPRESSION:  1. Dyspareunia.  2. Stress incontinence.  3. Dysmenorrhea on a continuous birth control method.   PLAN:  The patient is admitted for a TVH, BSO, anterior colporrhaphy, sling,  and graft placement.  She understands the indications for surgery.  She  understands the risks.  Three brochures were given to her regarding the  sling, the Pelvicol graft, and the hysterectomy.  All questions were  answered.                                               Lazaro Arms, M.D.    Loraine Maple  D:  12/04/2002  T:  12/04/2002  Job:  [id number]

## 2010-08-29 NOTE — Op Note (Signed)
[name], [name]                            ACCOUNT NO.:  [id number]   MEDICAL RECORD NO.:  [id number]                   PATIENT TYPE:  AMB   LOCATION:  DAY                                  FACILITY:  [facility]   PHYSICIAN:  Lazaro Arms, M.D.                DATE OF BIRTH:  [birth year]   DATE OF PROCEDURE:  12/05/2002  DATE OF DISCHARGE:                                 OPERATIVE REPORT   PREOPERATIVE DIAGNOSES:  1. Dyspareunia.  2. Pelvic prolapse.  3. Stress urinary incontinence.   POSTOPERATIVE DIAGNOSES:  1. Dyspareunia.  2. Pelvic prolapse.  3. Stress urinary incontinence.   PROCEDURE:  1. TVH/BSO.  2. Anterior colporrhaphy with placement of a Pelvicol graft to the     sacrospinous ligaments.  3. Interspinous vaginal vault suspension.  4. Retropubic mid urethral sling placement using PelviLace.   SURGEON:  Lazaro Arms, M.D.   ANESTHESIA:  General endotracheal.   FINDINGS:  The patient had a long history of dyspareunia, both midline and  laterally.  We placed her on continuous ____________, which helped her  periods but did not help the dyspareunia.  Additionally, she has a cystocele  and had complaints of stress incontinence.  She underwent urodynamics which  confirmed her history.   DESCRIPTION OF OPERATION:  The patient was taken to the operating room,  placed in a supine position, where she underwent general endotracheal  anesthesia.  She was then placed in high lithotomy position.  The lower  abdomen, inner thighs, perineum, vagina, and perianal region were prepped in  the usual sterile fashion, and she was draped.  Foley catheter was placed.  Weighted speculum was placed posteriorly; the cervix was grasped,  circumferential incision was made with the electrocautery unit after 0.5%  Marcaine was injected for hemostasis and for plane development.  The  anterior and posterior vagina were pushed off the lower uterine segment  without difficulty.  The posterior  cul-de-sac was entered sharply.  A thin  bill weighted speculum was placed.  The uterosacral ligaments were clamped,  cut, and suture ligated.  The cardinal ligaments were clamped, cut, and  suture ligated.  The anterior cul-de-sac was entered.  The anterior and  posterior leaves of the broad ligament were clamped, and the uterine vessels  were clamped in this pedicle as well.  They were cut and sutures placed  bilaterally.  Serial pedicles were taken up the fundus, each pedicle being  clamped, cut, and suture ligated.  The uteroovarian ligaments were cross-  clamped, and the uterus was removed.  Suture ligation was performed.  The  infundibulopelvic ligaments cross-clamped and double suture ligated with  good hemostasis bilaterally.  They were then cut.  The peritoneum was then  closed in a pursestring suture fashion.  The anterior vagina was then  injected in the midline with 0.5% Marcaine with 1:200,000 epinephrine and  incised in the midline.  The bladder was dissected off the anterior vaginal  wall without difficulty.  The ischial spines were identified bilaterally.  The Pelvicol graft 8 x 12 was then cut to size, and PelviLace needle  retriever system and 0 Vicryl hubs were placed into the sacrospinous  ligament bilaterally then through the Pelvicol graft and then sutured down.  The midline of the pubis was identified; 2 cm from either side was marked,  an incision was made, and the PelviLace needle system was used in a top to  bottom fashion, hugging the pubis down the entire length and exiting on  either side of the urethra.  The PelviLace was then brought back through and  adjusted to really no tension on the urethra at all, and it was incised at  the skin to be a tension-free mid urethral retropubic sling.  Diagnostic  cystoscopy was performed and revealed no bladder perforation, and there was  no blood in the bladder.  The Pelvicol graft was then sutured down to the  pubocervical  fascia bilaterally.  The apex of the vaginal vault was then  identified, and a suture was placed through the apex of the vaginal vault  and through either side of the Pelvicol graft, as the end of the wings of  the graft was placed in the sacrospinous ligament bilaterally.  This was an  interspinous vaginal vault suspension.  The vagina was then closed in an  interrupted fashion with good hemostasis of all pedicles.  The vagina was  then packed loosely with Betadine-impregnated gauze.  Foley catheter was  left in place.  Dermabond was placed in the two suprapubic incisions.  The  patient tolerated the procedure well.  She experienced 300 mL of blood loss,  was taken to the recovery room in good, stable condition.  All counts were  correct x 3.  She received Cleocin prophylactically, and all specimens went  to the lab.                                               Lazaro Arms, M.D.    Loraine Maple  D:  12/05/2002  T:  12/05/2002  Job:  [id number]

## 2010-08-29 NOTE — Discharge Summary (Signed)
   [name], [name]                            ACCOUNT NO.:  [id number]   MEDICAL RECORD NO.:  [id number]                   PATIENT TYPE:  INP   LOCATION:  A425                                 FACILITY:  [facility]   PHYSICIAN:  Lazaro Arms, M.D.                DATE OF BIRTH:  02-Oct-1970   DATE OF ADMISSION:  12/05/2002  DATE OF DISCHARGE:  12/07/2002                                 DISCHARGE SUMMARY   DISCHARGE DIAGNOSES:  1. Status post a total vaginal hysterectomy/bilateral salpingo-oophorectomy     with anterior colporrhaphy and placement of a Pelvicol graft and     interspinous vaginal vault suspension and a retropubic mid urethral sling     placement.  2. Unremarkable postoperative course.   PROCEDURE:  As above.   Please refer to the transcribed history and physical and operative note for  details of admission to the hospital.   HOSPITAL COURSE:  The patient was admitted postoperatively.  She had  difficulty with packing in place and the Foley with her pain, but that  resolved when it was removed.  She also had some nausea and vomiting.  She  had reflux, probably from the pain medicine.  She was ambulatory.  We took  her Foley out on the morning of postoperative day #1, and she had some  initial difficulty but then started voiding spontaneously.  She was  maintained on Levaquin postoperatively.  Her hemoglobin and hematocrit  postoperatively were 10 and 29.7, and her white count was 15.5, with 80%  segs.  She will go home on Levaquin 500 daily because of the placement of  the Pelvicol graft and mid urethral sling.  She was having minimal vaginal  bleeding.  She was discharged to home on Lorcet Plus and Motrin for pain,  Levaquin as an antibiotic, Pyridium to help with her bladder irritability,  Prevacid for her reflux, and she had a Climara 0.1 mg patch on for hormone  replacement.  She was given instructions on precautions for return to the  office, and she will be seen  back in the office next Friday for evaluation.  If she has any problems in the meantime she will contact us.                                               Lazaro Arms, M.D.    Loraine Maple  D:  12/07/2002  T:  12/08/2002  Job:  [id number]

## 2010-12-08 ENCOUNTER — Other Ambulatory Visit: Payer: Self-pay

## 2010-12-10 MED ORDER — OMEPRAZOLE 40 MG PO CPDR: 40.0000 mg | DELAYED_RELEASE_CAPSULE | Freq: Every day | ORAL | Status: DC

## 2011-01-06 ENCOUNTER — Encounter (HOSPITAL_COMMUNITY): Payer: Self-pay | Admitting: Oncology

## 2011-01-06 ENCOUNTER — Telehealth (HOSPITAL_COMMUNITY): Payer: Self-pay

## 2011-01-06 ENCOUNTER — Encounter (HOSPITAL_COMMUNITY): Payer: BC Managed Care – PPO | Attending: Oncology | Admitting: Oncology

## 2011-01-06 DIAGNOSIS — D473 Essential (hemorrhagic) thrombocythemia: Secondary | ICD-10-CM

## 2011-01-06 DIAGNOSIS — D72829 Elevated white blood cell count, unspecified: Secondary | ICD-10-CM

## 2011-01-06 DIAGNOSIS — D75839 Thrombocytosis, unspecified: Secondary | ICD-10-CM

## 2011-01-06 NOTE — Patient Instructions (Signed)
[facility]  Discharge Instructions  RECOMMENDATIONS MADE BY THE CONSULTANT AND ANY TEST RESULTS WILL BE SENT TO YOUR REFERRING DOCTOR.   EXAM FINDINGS BY MD TODAY AND SIGNS AND SYMPTOMS TO REPORT TO CLINIC OR PRIMARY MD: will check some labs today.  Need to stop smoking.  Will make referral to a different neurologist for your headaches  MEDICATIONS PRESCRIBED: none    INSTRUCTIONS GIVEN AND DISCUSSED:   SPECIAL INSTRUCTIONS/FOLLOW-UP: Xray Studies Needed ct of chest in 1 year and Return to Clinic in December.   I acknowledge that I have been informed and understand all the instructions given to me and received a copy. I do not have any more questions at this time, but understand that I may call the Specialty Clinic at [facility] at [phone] during business hours should I have any further questions or need assistance in obtaining follow-up care.    __________________________________________  _____________  __________ Signature of Patient or Authorized Representative            Date                   Time    __________________________________________ Nurse's Signature

## 2011-01-06 NOTE — Telephone Encounter (Signed)
[name], also do CBC diff and smear that day and B12 and folic acid levels!

## 2011-01-06 NOTE — Progress Notes (Signed)
This office note has been dictated.

## 2011-01-06 NOTE — Telephone Encounter (Signed)
Patient left before information received from lab regarding BCR/ABL that was to be done in March.  Test was not performed per [facility].  Patient will return 01/14/11 for repeat lab draw for BCR/ABL by PCR.  Information related to today's visit and follow-up appointments will be mailed to the patient.  A Referral will be made to Dr. Anne Hahn.

## 2011-01-07 NOTE — Progress Notes (Signed)
CC:   Ninfa Linden, FNP  DIAGNOSES: 1. Mild elevation in her platelets. 2. Mild elevation in her white count. 3. Headaches, unclear etiology with thus far negative workup by Dr.     Gerilyn Pilgrim. 4. Excessive thirst though she has a normal urine osmolality and     normal electrolytes.  HISTORY:  This is a pleasant lady who was born in [birth year].  She was referred in the past for mild elevation in her white count and platelets though her differentials have shown a minimal elevation in lymphocytes and occasionally monocyte elevation only.  She had a JAK2 gene test done which was totally negative.  Her white cells and platelets have not progressively changed.  She had an order for BCR-ABL as well that was unfortunately not done. She did have a urine osmolality checked on August 16 by Ninfa Linden, care of [facility] and her urine osmolality is perfectly normal in spite of her drinking 8-10 eight ounce fluid containers of water or more.  She had some blood tests also done on August 16 which have been sent to [place] as well, again showing this mild leukocytosis and thrombocytosis with a white count of 14,000, platelets of 512,000 and a normal hemoglobin and hematocrit.  MCV was minimally elevated at 99.  Her prior blood counts that we have are in the same range, namely in February of this year her white count was 13,500, this time with 84 neutrophils, no abnormality of lymphocytes or monocytes, platelets of 454,000, hemoglobin 15 g with a normal MCV at that time.  She had a sed rate then of 13.  She had normal liver enzymes.  She had a normal TSH level, normal B12 level and other labs that were unremarkable.  In November 2011 she had white counts in the 10,000-11,100 range with normal hemoglobins and platelets perfectly normal at that time.  So it has not been clear what is going on with this mild leukocytosis and mild thrombocytosis.  She has headaches which are  significant she states and she has been taking Tylenol 1000 mg essentially every 4 hours which I have tried to dissuade her from doing.  I think she needs to limit it to 4000 mg a day at most.  She saw Dr. Gerilyn Pilgrim in the past, really does not want to see him again, and has asked [place] to refer her to somebody in [place] which we will schedule.  Once again her other labs including BUN, creatinine and liver enzymes are perfectly normal.  So I am not sure exactly what is going on but in talking to her, as bad as her headaches are I think the first order of business is to get some relief for her headaches and get that scheduled.  I think we need to see her back in 12 weeks to see where her CBC and diff are at that time and will schedule that.  In the meantime will get that BCR-ABL test fulfilled today and sent off.  I think if things do not improve I think a B12 should be repeated, folic acid level.  I think we need to be on the safe side if need be.    ______________________________ Ladona Horns. Mariel Sleet, MD ESN/MEDQ  D:  01/06/2011  T:  01/07/2011  Job:  [id number]

## 2011-01-07 NOTE — Telephone Encounter (Signed)
Did you get my message on her?

## 2011-01-09 ENCOUNTER — Other Ambulatory Visit (HOSPITAL_COMMUNITY): Payer: Self-pay

## 2011-01-09 DIAGNOSIS — D75839 Thrombocytosis, unspecified: Secondary | ICD-10-CM

## 2011-01-09 DIAGNOSIS — D473 Essential (hemorrhagic) thrombocythemia: Secondary | ICD-10-CM

## 2011-01-14 ENCOUNTER — Other Ambulatory Visit (HOSPITAL_COMMUNITY): Payer: BC Managed Care – PPO

## 2011-01-15 ENCOUNTER — Other Ambulatory Visit (HOSPITAL_COMMUNITY): Payer: Self-pay | Admitting: Nurse Practitioner

## 2011-01-15 DIAGNOSIS — Z139 Encounter for screening, unspecified: Secondary | ICD-10-CM

## 2011-01-19 ENCOUNTER — Encounter (HOSPITAL_COMMUNITY): Payer: BC Managed Care – PPO | Attending: Oncology

## 2011-01-19 DIAGNOSIS — D75839 Thrombocytosis, unspecified: Secondary | ICD-10-CM

## 2011-01-19 DIAGNOSIS — D473 Essential (hemorrhagic) thrombocythemia: Secondary | ICD-10-CM | POA: Insufficient documentation

## 2011-01-19 LAB — CBC
HCT: 42.5 % (ref 36.0–46.0)
Hemoglobin: 13.8 g/dL (ref 12.0–15.0)
MCV: 94.2 fL (ref 78.0–100.0)
RDW: 12.8 % (ref 11.5–15.5)
WBC: 10.3 10*3/uL (ref 4.0–10.5)

## 2011-01-19 LAB — DIFFERENTIAL
Eosinophils Relative: 2 % (ref 0–5)
Lymphocytes Relative: 30 % (ref 12–46)
Lymphs Abs: 3 10*3/uL (ref 0.7–4.0)

## 2011-01-19 LAB — FOLATE: Folate: 10.5 ng/mL

## 2011-01-19 NOTE — Progress Notes (Signed)
Labs drawn today for bcr/abl ,cbcdiff,b12,folic acid,smear for dr n to review

## 2011-01-27 LAB — BCR/ABL GENE REARRANGEMENT QNT, PCR
BCR ABL1 / ABL1 IS: 0 not reported
BCR ABL1 / ABL1: 0 not reported

## 2011-01-29 ENCOUNTER — Ambulatory Visit (HOSPITAL_COMMUNITY): Payer: BC Managed Care – PPO

## 2011-02-02 ENCOUNTER — Ambulatory Visit (HOSPITAL_COMMUNITY): Payer: BC Managed Care – PPO

## 2011-03-30 ENCOUNTER — Other Ambulatory Visit (HOSPITAL_COMMUNITY): Payer: BC Managed Care – PPO

## 2011-04-03 ENCOUNTER — Ambulatory Visit (HOSPITAL_COMMUNITY): Payer: BC Managed Care – PPO | Admitting: Oncology

## 2011-04-22 ENCOUNTER — Other Ambulatory Visit: Payer: Self-pay

## 2011-04-22 MED ORDER — OMEPRAZOLE 40 MG PO CPDR: 40.0000 mg | DELAYED_RELEASE_CAPSULE | Freq: Every day | ORAL | Status: DC

## 2011-08-26 ENCOUNTER — Other Ambulatory Visit: Payer: Self-pay

## 2011-08-26 MED ORDER — OMEPRAZOLE 40 MG PO CPDR: 40.0000 mg | DELAYED_RELEASE_CAPSULE | Freq: Every day | ORAL | Status: DC

## 2011-10-22 ENCOUNTER — Other Ambulatory Visit (HOSPITAL_COMMUNITY): Payer: Self-pay | Admitting: Nurse Practitioner

## 2011-10-22 DIAGNOSIS — Z139 Encounter for screening, unspecified: Secondary | ICD-10-CM

## 2011-10-26 ENCOUNTER — Ambulatory Visit (HOSPITAL_COMMUNITY)
Admission: RE | Admit: 2011-10-26 | Discharge: 2011-10-26 | Disposition: A | Discharge status: Home / Self Care | Payer: BC Managed Care – PPO | Source: Ambulatory Visit | Attending: Nurse Practitioner | Admitting: Nurse Practitioner

## 2011-10-26 DIAGNOSIS — Z1231 Encounter for screening mammogram for malignant neoplasm of breast: Secondary | ICD-10-CM | POA: Insufficient documentation

## 2011-10-26 DIAGNOSIS — Z139 Encounter for screening, unspecified: Secondary | ICD-10-CM

## 2012-02-22 ENCOUNTER — Other Ambulatory Visit (HOSPITAL_COMMUNITY): Payer: Self-pay | Admitting: Neurology

## 2012-02-22 DIAGNOSIS — I6529 Occlusion and stenosis of unspecified carotid artery: Secondary | ICD-10-CM

## 2012-02-25 ENCOUNTER — Ambulatory Visit (HOSPITAL_COMMUNITY)
Admission: RE | Admit: 2012-02-25 | Discharge: 2012-02-25 | Disposition: A | Discharge status: Home / Self Care | Payer: BC Managed Care – PPO | Source: Ambulatory Visit | Attending: Neurology | Admitting: Neurology

## 2012-02-25 DIAGNOSIS — I6529 Occlusion and stenosis of unspecified carotid artery: Secondary | ICD-10-CM

## 2012-02-26 ENCOUNTER — Encounter (HOSPITAL_COMMUNITY): Payer: Self-pay

## 2012-02-26 ENCOUNTER — Other Ambulatory Visit (HOSPITAL_COMMUNITY): Payer: Self-pay | Admitting: Interventional Radiology

## 2012-02-26 ENCOUNTER — Other Ambulatory Visit: Payer: Self-pay | Admitting: Radiology

## 2012-02-26 DIAGNOSIS — I639 Cerebral infarction, unspecified: Secondary | ICD-10-CM

## 2012-02-26 DIAGNOSIS — I6529 Occlusion and stenosis of unspecified carotid artery: Secondary | ICD-10-CM

## 2012-02-29 ENCOUNTER — Other Ambulatory Visit: Payer: Self-pay | Admitting: Radiology

## 2012-02-29 ENCOUNTER — Ambulatory Visit (HOSPITAL_COMMUNITY): Admission: RE | Admit: 2012-02-29 | Payer: BC Managed Care – PPO | Source: Ambulatory Visit

## 2012-03-02 ENCOUNTER — Other Ambulatory Visit (HOSPITAL_COMMUNITY): Payer: Self-pay | Admitting: Interventional Radiology

## 2012-03-02 ENCOUNTER — Ambulatory Visit (HOSPITAL_COMMUNITY)
Admission: RE | Admit: 2012-03-02 | Discharge: 2012-03-02 | Disposition: A | Discharge status: Home / Self Care | Payer: BC Managed Care – PPO | Source: Ambulatory Visit | Attending: Interventional Radiology | Admitting: Interventional Radiology

## 2012-03-02 ENCOUNTER — Encounter (HOSPITAL_COMMUNITY): Payer: Self-pay

## 2012-03-02 DIAGNOSIS — I6529 Occlusion and stenosis of unspecified carotid artery: Secondary | ICD-10-CM | POA: Insufficient documentation

## 2012-03-02 DIAGNOSIS — I639 Cerebral infarction, unspecified: Secondary | ICD-10-CM

## 2012-03-02 DIAGNOSIS — I658 Occlusion and stenosis of other precerebral arteries: Secondary | ICD-10-CM | POA: Insufficient documentation

## 2012-03-02 DIAGNOSIS — E785 Hyperlipidemia, unspecified: Secondary | ICD-10-CM | POA: Insufficient documentation

## 2012-03-02 DIAGNOSIS — R51 Headache: Secondary | ICD-10-CM | POA: Insufficient documentation

## 2012-03-02 LAB — BASIC METABOLIC PANEL
BUN: 4 mg/dL — ABNORMAL LOW (ref 6–23)
GFR calc Af Amer: >90 mL/min (ref >90)
GFR calc non Af Amer: >90 mL/min (ref >90)
Potassium: 4.2 mEq/L (ref 3.5–5.1)
Sodium: 137 mEq/L (ref 135–145)

## 2012-03-02 LAB — PROTIME-INR
INR: 0.96 (ref 0.00–1.49)
Prothrombin Time: 12.7 seconds (ref 11.6–15.2)

## 2012-03-02 LAB — CBC
Hemoglobin: 14.2 g/dL (ref 12.0–15.0)
MCHC: 34.9 g/dL (ref 30.0–36.0)
Platelets: 294 10*3/uL (ref 150–400)
RBC: 4.48 MIL/uL (ref 3.87–5.11)

## 2012-03-02 MED ORDER — MIDAZOLAM HCL 2 MG/2ML IJ SOLN
INTRAMUSCULAR | Status: AC
  Filled 2012-03-02: qty 4

## 2012-03-02 MED ORDER — HYDRALAZINE HCL 20 MG/ML IJ SOLN
INTRAMUSCULAR | Status: AC
  Filled 2012-03-02: qty 1

## 2012-03-02 MED ORDER — SODIUM CHLORIDE 0.9 % IV SOLN: INTRAVENOUS | Status: AC

## 2012-03-02 MED ORDER — IOHEXOL 300 MG/ML  SOLN
150.0000 mL | Freq: Once | INTRAMUSCULAR | Status: AC | PRN
  Administered 2012-03-02: 70 mL via INTRA_ARTERIAL

## 2012-03-02 MED ORDER — FENTANYL CITRATE 0.05 MG/ML IJ SOLN
INTRAMUSCULAR | Status: AC | PRN
  Administered 2012-03-02: 25 ug via INTRAVENOUS

## 2012-03-02 MED ORDER — SODIUM CHLORIDE 0.9 % IV SOLN
INTRAVENOUS | Status: AC | PRN
  Administered 2012-03-02: 75 mL/h via INTRAVENOUS

## 2012-03-02 MED ORDER — ESOMEPRAZOLE MAGNESIUM 40 MG PO CPDR: 40.0000 mg | DELAYED_RELEASE_CAPSULE | Freq: Every day | ORAL | Status: DC

## 2012-03-02 MED ORDER — FENTANYL CITRATE 0.05 MG/ML IJ SOLN
INTRAMUSCULAR | Status: AC
  Filled 2012-03-02: qty 4

## 2012-03-02 MED ORDER — MIDAZOLAM HCL 2 MG/2ML IJ SOLN
INTRAMUSCULAR | Status: AC | PRN
  Administered 2012-03-02: 1 mg via INTRAVENOUS

## 2012-03-02 MED ORDER — HEPARIN SOD (PORK) LOCK FLUSH 100 UNIT/ML IV SOLN
INTRAVENOUS | Status: AC | PRN
  Administered 2012-03-02: 500 [IU] via INTRAVENOUS

## 2012-03-02 MED ORDER — SODIUM CHLORIDE 0.45 % IV SOLN
INTRAVENOUS | Status: DC
  Administered 2012-03-02: 09:00:00 via INTRAVENOUS

## 2012-03-02 NOTE — H&P (Signed)
[name] is an 41 y.o. female.   Chief Complaint: headaches and right sided numbness. Left hand dominant female. Presents today for cerebral angiogram.  HPI: patient experiencing more frequent headaches and right hand and arm numbness, right facial numbness and slurred speech that occurred on 02/04/12. MRI/MRA which was performed through [facility] ordered by neurologist, Dr. 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  Patient's symptoms have improved since initiation of plavix.  Strong family history of vascular disease.   Past Medical History  Diagnosis Date  . Hyperlipidemia   . Cervical cancer   . PUD (peptic ulcer disease)     NSAIDS   . Chronic headaches     Past Surgical History  Procedure Date  . Total vaginal hysterectomy   . Bladder suspension   . Esophagogastroduodenoscopy 02/2010    Dr. Jonette Eva. Multiple duodenal ulcers, gastric erosions (bx negative for H.Pylori)  . Multiple cyst removal     coccyx, left ring finger, pelvic area & throat/vocal cord and pilonidal cyst    Family History  Problem Relation Age of Onset  . Diabetes Mother   . COPD Mother   . Heart disease Father   . Cervical cancer Sister   . Colon cancer Neg Hx   . Colon polyps Neg Hx    Social History:  reports that she has been smoking Cigarettes.  She has a 25 pack-year smoking history. She has never used smokeless tobacco. She reports that she drinks about 8.4 - 16.8 ounces of alcohol per week. She reports that she does not use illicit drugs.  Allergies:  Allergies  Allergen Reactions  . Pantoprazole Sodium Itching  . Penicillins Itching  . Sulfa Antibiotics Itching     Medication List     As of 03/02/2012 11:06 AM    ASK your doctor about these medications         acetaminophen 500 MG tablet   Commonly known as: TYLENOL   Take 500 mg by mouth every 6 (six) hours as needed. Taking it for headache.      aspirin EC 81 MG tablet   Take 81 mg by mouth daily.      atorvastatin 20 MG tablet   Commonly known as: LIPITOR   Take 20 mg by mouth daily.      clopidogrel 75 MG tablet   Commonly known as: PLAVIX   Take 75 mg by mouth daily.      divalproex 500 MG 24 hr tablet   Commonly known as: DEPAKOTE ER   Take 1,000 mg by mouth at bedtime.      esomeprazole 40 MG capsule   Commonly known as: NEXIUM   Take 1 capsule (40 mg total) by mouth daily before breakfast.      estradiol 0.075 MG/24HR   Commonly known as: VIVELLE-DOT   Place 1 patch onto the skin 2 (two) times a week.      FISH OIL TRIPLE STRENGTH 1400 MG Caps   Take 1 capsule by mouth daily.      ibuprofen 200 MG tablet   Commonly known as: ADVIL,MOTRIN   Take 600 mg by mouth every 6 (six) hours as needed. For headache      omeprazole 40 MG capsule   Commonly known as: PRILOSEC   Take 1 capsule (40 mg total) by mouth daily.          Results for orders placed during the hospital encounter of 03/02/12 (from the  past 48 hour(s))  BASIC METABOLIC PANEL     Status: Abnormal   Collection Time   03/02/12  8:22 AM      Component Value Range Comment   Sodium 137  135 - 145 mEq/L    Potassium 4.2  3.5 - 5.1 mEq/L    Chloride 99  96 - 112 mEq/L    CO2 27  19 - 32 mEq/L    Glucose, Bld 82  70 - 99 mg/dL    BUN 4 (*) 6 - 23 mg/dL    Creatinine, Ser 1.61  0.50 - 1.10 mg/dL    Calcium 9.5  8.4 - 09.6 mg/dL    GFR calc non Af Amer >90  >90 mL/min    GFR calc Af Amer >90  >90 mL/min   CBC     Status: Abnormal   Collection Time   03/02/12  8:22 AM      Component Value Range Comment   WBC 13.4 (*) 4.0 - 10.5 K/uL    RBC 4.48  3.87 - 5.11 MIL/uL    Hemoglobin 14.2  12.0 - 15.0 g/dL    HCT 04.5  40.9 - 81.1 %    MCV 90.8  78.0 - 100.0 fL    MCH 31.7  26.0 - 34.0 pg    MCHC 34.9  30.0 - 36.0 g/dL    RDW 91.4  78.2 - 95.6 %    Platelets 294  150 - 400 K/uL   PROTIME-INR     Status: Normal   Collection Time   03/02/12  9:03 AM      Component Value  Range Comment   Prothrombin Time 12.7  11.6 - 15.2 seconds    INR 0.96  0.00 - 1.49   APTT     Status: Normal   Collection Time   03/02/12  9:03 AM      Component Value Range Comment   aPTT 33  24 - 37 seconds     Review of Systems  Constitutional: Negative for fever, chills and weight loss.  Respiratory: Negative for cough, hemoptysis, sputum production and shortness of breath.   Cardiovascular: Negative for chest pain, palpitations, orthopnea and leg swelling.  Gastrointestinal: Positive for heartburn. Negative for nausea, vomiting and abdominal pain.  Musculoskeletal: Negative for myalgias and falls.  Skin: Negative.   Neurological: Positive for sensory change, weakness and headaches. Negative for focal weakness, seizures and loss of consciousness.       Rt hand and arm numbness and tingling have resolved since initiation of plavix   Endo/Heme/Allergies: Bruises/bleeds easily.  Psychiatric/Behavioral: The patient is nervous/anxious.     Blood pressure 122/84, pulse 79, resp. rate 19, SpO2 100.00%. Physical Exam  Constitutional: She is oriented to person, place, and time. She appears well-developed and well-nourished. No distress.  HENT:  Head: Normocephalic and atraumatic.       Clear speech with symmetric face. Tongue midline    Eyes: EOM are normal. Pupils are equal, round, and reactive to light.  Neck: Normal range of motion.  Cardiovascular: Normal rate, regular rhythm, normal heart sounds and intact distal pulses.  Exam reveals no gallop and no friction rub.   No murmur heard. Respiratory: Effort normal and breath sounds normal. No respiratory distress. She has no wheezes. She has no rales. She exhibits no tenderness.  GI: Soft. Bowel sounds are normal.  Musculoskeletal: Normal range of motion. She exhibits no edema and no tenderness.  Neurological: She is alert and oriented to  person, place, and time.  Skin: Skin is warm and dry. No rash noted. No erythema.    Psychiatric: She has a normal mood and affect. Her behavior is normal. Judgment and thought content normal.     Assessment/Plan Patient was seen in consultation to discuss findings on MRA in which plavix was initiated and angiogram recommended.  Patient presents today with spouse for same.  Procedure details, risks and reasons for same reviewed with them with their apparent understanding. Written consent obtained.   Aedyn Kempfer D 03/02/2012, 11:06 AM

## 2012-03-02 NOTE — Procedures (Signed)
S/P 4 vessel cerebral arteriogram RT CFA approach  Findings. 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

## 2012-03-04 ENCOUNTER — Other Ambulatory Visit (HOSPITAL_COMMUNITY): Payer: Self-pay | Admitting: Interventional Radiology

## 2012-03-04 DIAGNOSIS — I6529 Occlusion and stenosis of unspecified carotid artery: Secondary | ICD-10-CM

## 2012-03-04 DIAGNOSIS — I639 Cerebral infarction, unspecified: Secondary | ICD-10-CM

## 2012-03-08 ENCOUNTER — Ambulatory Visit (HOSPITAL_COMMUNITY)
Admission: RE | Admit: 2012-03-08 | Discharge: 2012-03-08 | Disposition: A | Discharge status: Home / Self Care | Payer: BC Managed Care – PPO | Source: Ambulatory Visit | Attending: Interventional Radiology | Admitting: Interventional Radiology

## 2012-03-08 DIAGNOSIS — I6529 Occlusion and stenosis of unspecified carotid artery: Secondary | ICD-10-CM

## 2012-03-08 DIAGNOSIS — I639 Cerebral infarction, unspecified: Secondary | ICD-10-CM

## 2012-03-15 ENCOUNTER — Other Ambulatory Visit (HOSPITAL_COMMUNITY): Payer: Self-pay | Admitting: Interventional Radiology

## 2012-03-15 DIAGNOSIS — I639 Cerebral infarction, unspecified: Secondary | ICD-10-CM

## 2012-03-15 DIAGNOSIS — I6529 Occlusion and stenosis of unspecified carotid artery: Secondary | ICD-10-CM

## 2012-03-17 ENCOUNTER — Encounter (HOSPITAL_COMMUNITY): Payer: Self-pay | Admitting: Pharmacy Technician

## 2012-03-18 ENCOUNTER — Other Ambulatory Visit: Payer: Self-pay | Admitting: Radiology

## 2012-03-22 ENCOUNTER — Encounter (HOSPITAL_COMMUNITY)
Admission: RE | Admit: 2012-03-22 | Discharge: 2012-03-22 | Disposition: A | Discharge status: Home / Self Care | Payer: BC Managed Care – PPO | Source: Ambulatory Visit | Attending: Interventional Radiology | Admitting: Interventional Radiology

## 2012-03-22 ENCOUNTER — Ambulatory Visit (HOSPITAL_COMMUNITY)
Admission: RE | Admit: 2012-03-22 | Discharge: 2012-03-22 | Disposition: A | Discharge status: Home / Self Care | Payer: BC Managed Care – PPO | Source: Ambulatory Visit | Attending: Interventional Radiology | Admitting: Interventional Radiology

## 2012-03-22 ENCOUNTER — Encounter (HOSPITAL_COMMUNITY): Payer: Self-pay

## 2012-03-22 DIAGNOSIS — R05 Cough: Secondary | ICD-10-CM | POA: Insufficient documentation

## 2012-03-22 DIAGNOSIS — R059 Cough, unspecified: Secondary | ICD-10-CM | POA: Insufficient documentation

## 2012-03-22 DIAGNOSIS — I6529 Occlusion and stenosis of unspecified carotid artery: Secondary | ICD-10-CM | POA: Insufficient documentation

## 2012-03-22 HISTORY — DX: Occlusion and stenosis of right carotid artery: I65.21

## 2012-03-22 HISTORY — DX: Chronic or unspecified peptic ulcer, site unspecified, with hemorrhage: K27.4

## 2012-03-22 HISTORY — DX: Chronic or unspecified gastrojejunal ulcer with hemorrhage: K28.4

## 2012-03-22 HISTORY — DX: Gastro-esophageal reflux disease without esophagitis: K21.9

## 2012-03-22 HISTORY — DX: Transient cerebral ischemic attack, unspecified: G45.9

## 2012-03-22 HISTORY — DX: Bronchitis, not specified as acute or chronic: J40

## 2012-03-22 LAB — CBC WITH DIFFERENTIAL/PLATELET
Basophils Relative: 1 % (ref 0–1)
Eosinophils Absolute: 0.2 10*3/uL (ref 0.0–0.7)
Hemoglobin: 12 g/dL (ref 12.0–15.0)
MCH: 30.8 pg (ref 26.0–34.0)
MCHC: 33.3 g/dL (ref 30.0–36.0)
Monocytes Relative: 9 % (ref 3–12)
Neutrophils Relative %: 45 % (ref 43–77)

## 2012-03-22 LAB — PROTIME-INR
INR: 0.92 (ref 0.00–1.49)
Prothrombin Time: 12.3 seconds (ref 11.6–15.2)

## 2012-03-22 LAB — COMPREHENSIVE METABOLIC PANEL
Albumin: 3.5 g/dL (ref 3.5–5.2)
Alkaline Phosphatase: 74 U/L (ref 39–117)
BUN: 8 mg/dL (ref 6–23)
Creatinine, Ser: 0.63 mg/dL (ref 0.50–1.10)
Potassium: 3.9 mEq/L (ref 3.5–5.1)
Total Protein: 7.5 g/dL (ref 6.0–8.3)

## 2012-03-22 NOTE — Progress Notes (Signed)
Beckey Downing, PA paged in regards to Plavix administration DOS but she has not returned page.

## 2012-03-22 NOTE — Pre-Procedure Instructions (Signed)
[address]  03/22/2012   Your procedure is scheduled on:  Wednesday March 23, 2012.  Report to [facility] at 0630 AM.  Call this number if you have problems the morning of surgery: [phone]   Remember:   Do not eat food or drink:After Midnight.    Take these medicines the morning of surgery with A SIP OF WATER: Acetaminophen (Tylenol) if needed for headache, Omeprazole (Prilosec) Please continue Plavix 4 days prior to surgery   Do not wear jewelry, make-up or nail polish.  Do not wear lotions, powders, or perfumes. You may NOT wear deodorant.  Do not shave 48 hours prior to surgery.   Do not bring valuables to the hospital.  Contacts, dentures or bridgework may not be worn into surgery.  Leave suitcase in the car. After surgery it may be brought to your room.  For patients admitted to the hospital, checkout time is 11:00 AM the day of discharge.   Patients discharged the day of surgery will not be allowed to drive home.  Name and phone number of your driver:   Special Instructions: Shower using CHG 2 nights before surgery and the night before surgery.  If you shower the day of surgery use CHG.  Use special wash - you have one bottle of CHG for all showers.  You should use approximately 1/3 of the bottle for each shower.   Please read over the following fact sheets that you were given: Pain Booklet, Coughing and Deep Breathing and Surgical Site Infection Prevention

## 2012-03-22 NOTE — Progress Notes (Signed)
Patient denied having a stress test, cardiac cath, or sleep study. Patient stated she had a very bad nose bleed and called Pam,  Dr. Fatima Sanger PA, and she informed patient to stop aspirin but to continue taking Plavix. PCP is Ninfa Linden, FNP at [facility]. LOV was this year. Patient also sees Dr. Lesia Sago at [facility] for headaches.

## 2012-03-22 NOTE — Consult Note (Signed)
Anesthesia Chart Review: Patient is a 41 year old female scheduled for cerebral arteriogram with angioplasty and stent of right internal carotid artery on 03/23/12 by Dr. Corliss Skains.  Case is posted to be done under general anesthesia.  History includes headaches for the past 2 years with bilateral upper extremity and facial numbness with episodes of expressive aphasia in late October 2013.  By notes, she was evaluated by neurologist Dr. Anne Hahn. MRI/MRA of the brain/head was performed on 02/18/2012 and revealed acute left frontal subcortical and bilateral frontal subcortical subacute infarcts. There were absent flow-voids of both internal carotid artery suggestive of proximal occlusion/high-grade stenosis. There was slight enlargement of the pituitary sella raising suspicion of adenoma. 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 She was started on ASA and Plavix and referred to IR Dr. Corliss Skains.  (Patient reports that's Beckey Downing, PA-C told her that she could hold her ASA due to epistaxis, but is to remain on Plavix.  Patient's PAT RN to confirm anti-platelet instructions for the day of surgery with an IR PA-C.)    Other history includes hyperlipidemia, cervical cancer, peptic ulcer disease, GERD, hysterectomy, bladder suspension, cyst removal from coccyx, left ring finger, pelvic region, umbilical cord. She recently quit smoking.  Cerebral arteriogram performed on 03/02/2012 revealed: 1. Complete angiographic occlusion of the left internal carotid artery distal to the bulb without evidence of distal  reconstitution.  2. Approximately 70-75% stenosis of the origin of the left  external carotid artery.  3. Near complete occlusion of the right internal carotid artery in the supraclinoid segment with a string line projecting into the right middle  cerebral artery proximally.  EKG on 03/22/12 showed SB @ 55 bpm.  CXR on 03/22/12 showed no edema or consolidation.  Pre-operative labs noted.  She will be evaluated by her assigned anesthesiologist on the day of surgery.  Anticipate she can proceed as planned.   Shonna Chock, PA-C 03/22/12 1145

## 2012-03-22 NOTE — Progress Notes (Signed)
Pam, PA returned page and instructed Nurse that patient only needed to take ONE 75 mg dose of Plavix on DOS. If patient took Plavix before arrival then an additional 75 mg dose of Plavix would not need to be taken as ordered on DOS.

## 2012-03-23 ENCOUNTER — Encounter (HOSPITAL_COMMUNITY): Payer: Self-pay | Admitting: Vascular Surgery

## 2012-03-23 ENCOUNTER — Ambulatory Visit (HOSPITAL_COMMUNITY)
Admission: RE | Admit: 2012-03-23 | Discharge: 2012-03-23 | Disposition: A | Discharge status: Home / Self Care | Payer: BC Managed Care – PPO | Source: Ambulatory Visit | Attending: Interventional Radiology | Admitting: Interventional Radiology

## 2012-03-23 ENCOUNTER — Ambulatory Visit (HOSPITAL_COMMUNITY): Payer: BC Managed Care – PPO | Admitting: Vascular Surgery

## 2012-03-23 ENCOUNTER — Encounter (HOSPITAL_COMMUNITY): Payer: Self-pay

## 2012-03-23 ENCOUNTER — Encounter (HOSPITAL_COMMUNITY)
Admission: RE | Disposition: A | Discharge status: Home / Self Care | Payer: Self-pay | Source: Ambulatory Visit | Attending: Interventional Radiology

## 2012-03-23 VITALS — BP 124/79 | HR 70 | Temp 97.0°F | Resp 18

## 2012-03-23 DIAGNOSIS — I658 Occlusion and stenosis of other precerebral arteries: Secondary | ICD-10-CM | POA: Insufficient documentation

## 2012-03-23 DIAGNOSIS — Z8673 Personal history of transient ischemic attack (TIA), and cerebral infarction without residual deficits: Secondary | ICD-10-CM | POA: Insufficient documentation

## 2012-03-23 DIAGNOSIS — Z01810 Encounter for preprocedural cardiovascular examination: Secondary | ICD-10-CM | POA: Insufficient documentation

## 2012-03-23 DIAGNOSIS — I6529 Occlusion and stenosis of unspecified carotid artery: Secondary | ICD-10-CM

## 2012-03-23 DIAGNOSIS — Z01812 Encounter for preprocedural laboratory examination: Secondary | ICD-10-CM | POA: Insufficient documentation

## 2012-03-23 DIAGNOSIS — Z01818 Encounter for other preprocedural examination: Secondary | ICD-10-CM | POA: Insufficient documentation

## 2012-03-23 DIAGNOSIS — I639 Cerebral infarction, unspecified: Secondary | ICD-10-CM

## 2012-03-23 HISTORY — PX: RADIOLOGY WITH ANESTHESIA: SHX6223

## 2012-03-23 SURGERY — RADIOLOGY WITH ANESTHESIA
Anesthesia: General

## 2012-03-23 MED ORDER — HYDROMORPHONE HCL PF 1 MG/ML IJ SOLN: 0.2500 mg | INTRAMUSCULAR | Status: DC | PRN

## 2012-03-23 MED ORDER — ONDANSETRON HCL 4 MG/2ML IJ SOLN: 4.0000 mg | Freq: Once | INTRAMUSCULAR | Status: AC | PRN

## 2012-03-23 MED ORDER — MIDAZOLAM HCL 5 MG/5ML IJ SOLN
INTRAMUSCULAR | Status: DC | PRN
  Administered 2012-03-23: .5 mg via INTRAVENOUS

## 2012-03-23 MED ORDER — ACETAMINOPHEN 10 MG/ML IV SOLN: 1000.0000 mg | Freq: Once | INTRAVENOUS | Status: AC | PRN

## 2012-03-23 MED ORDER — VANCOMYCIN HCL 1000 MG IV SOLR
1000.0000 mg | Freq: Once | INTRAVENOUS | Status: DC
  Filled 2012-03-23: qty 1000

## 2012-03-23 MED ORDER — IOHEXOL 300 MG/ML  SOLN
150.0000 mL | Freq: Once | INTRAMUSCULAR | Status: AC | PRN
  Administered 2012-03-23: 25 mL via INTRAVENOUS

## 2012-03-23 MED ORDER — NIMODIPINE 30 MG PO CAPS: 60.0000 mg | ORAL_CAPSULE | ORAL | Status: DC

## 2012-03-23 MED ORDER — ASPIRIN EC 325 MG PO TBEC: 325.0000 mg | DELAYED_RELEASE_TABLET | ORAL | Status: DC

## 2012-03-23 MED ORDER — NIMODIPINE 30 MG PO CAPS
ORAL_CAPSULE | ORAL | Status: AC
  Filled 2012-03-23: qty 1

## 2012-03-23 MED ORDER — LACTATED RINGERS IV SOLN
INTRAVENOUS | Status: DC | PRN
  Administered 2012-03-23: 08:00:00 via INTRAVENOUS

## 2012-03-23 MED ORDER — FENTANYL CITRATE 0.05 MG/ML IJ SOLN
INTRAMUSCULAR | Status: DC | PRN
  Administered 2012-03-23: 25 ug via INTRAVENOUS

## 2012-03-23 MED ORDER — ASPIRIN EC 325 MG PO TBEC
DELAYED_RELEASE_TABLET | ORAL | Status: AC
  Filled 2012-03-23: qty 1

## 2012-03-23 MED ORDER — VANCOMYCIN HCL IN DEXTROSE 1-5 GM/200ML-% IV SOLN
INTRAVENOUS | Status: AC
  Administered 2012-03-23: 1000 mg via INTRAVENOUS
  Filled 2012-03-23: qty 200

## 2012-03-23 MED ORDER — CLOPIDOGREL BISULFATE 75 MG PO TABS: 75.0000 mg | ORAL_TABLET | ORAL | Status: DC

## 2012-03-23 MED ORDER — SODIUM CHLORIDE 0.9 % IJ SOLN
1.5000 mg | INTRAVENOUS | Status: DC
  Filled 2012-03-23: qty 0.3

## 2012-03-23 MED ORDER — SODIUM CHLORIDE 0.9 % IV SOLN: INTRAVENOUS | Status: AC

## 2012-03-23 NOTE — H&P (Signed)
[name] is an 41 y.o. female.   Chief Complaint: Rt sided headache; numbness of face and hand. Sxs started Oct 2013 - resolved Cerebral angio Nov 2013: Occl L Internal carotid artery; stenosis R Internal carotid artery Consulted with Dr Corliss Skains - scheduled now for cerebral arteriogram with possible Angioplasty/stent R ICA. HPI: smoker - decreased; HLD; Cervical ca; PUD; TIA; GERD  Past Medical History  Diagnosis Date  . Hyperlipidemia   . Cervical cancer   . PUD (peptic ulcer disease)     NSAIDS   . Chronic headaches   . Bronchitis     hx of  . Stenosis of right carotid artery   . TIA (transient ischemic attack)   . GERD (gastroesophageal reflux disease)   . Bleeding ulcer     Past Surgical History  Procedure Date  . Total vaginal hysterectomy   . Bladder suspension   . Esophagogastroduodenoscopy 02/2010    Dr. Jonette Eva. Multiple duodenal ulcers, gastric erosions (bx negative for H.Pylori)  . Multiple cyst removal     coccyx, left ring finger, pelvic area & throat/vocal cord and pilonidal cyst  . Abdominal hysterectomy     Family History  Problem Relation Age of Onset  . Diabetes Mother   . COPD Mother   . Heart disease Father   . Cervical cancer Sister   . Colon cancer Neg Hx   . Colon polyps Neg Hx    Social History:  reports that she has quit smoking. Her smoking use included Cigarettes. She has a 25 pack-year smoking history. She quit smokeless tobacco use about 2 weeks ago. She reports that she drinks about 8.4 - 16.8 ounces of alcohol per week. She reports that she does not use illicit drugs.  Allergies:  Allergies  Allergen Reactions  . Pantoprazole Sodium Itching  . Penicillins Itching  . Sulfa Antibiotics Itching     (Not in a hospital admission)  Results for orders placed during the hospital encounter of 03/22/12 (from the past 48 hour(s))  APTT     Status: Normal   Collection Time   03/22/12  9:28 AM      Component Value Range Comment    aPTT 31  24 - 37 seconds   CBC WITH DIFFERENTIAL     Status: Normal   Collection Time   03/22/12  9:28 AM      Component Value Range Comment   WBC 8.9  4.0 - 10.5 K/uL    RBC 3.89  3.87 - 5.11 MIL/uL    Hemoglobin 12.0  12.0 - 15.0 g/dL    HCT 08.6  57.8 - 46.9 %    MCV 92.5  78.0 - 100.0 fL    MCH 30.8  26.0 - 34.0 pg    MCHC 33.3  30.0 - 36.0 g/dL    RDW 62.9  52.8 - 41.3 %    Platelets 289  150 - 400 K/uL    Neutrophils Relative 45  43 - 77 %    Neutro Abs 4.0  1.7 - 7.7 K/uL    Lymphocytes Relative 43  12 - 46 %    Lymphs Abs 3.8  0.7 - 4.0 K/uL    Monocytes Relative 9  3 - 12 %    Monocytes Absolute 0.8  0.1 - 1.0 K/uL    Eosinophils Relative 2  0 - 5 %    Eosinophils Absolute 0.2  0.0 - 0.7 K/uL    Basophils Relative 1  0 -  1 %    Basophils Absolute 0.1  0.0 - 0.1 K/uL   COMPREHENSIVE METABOLIC PANEL     Status: Abnormal   Collection Time   03/22/12  9:28 AM      Component Value Range Comment   Sodium 137  135 - 145 mEq/L    Potassium 3.9  3.5 - 5.1 mEq/L    Chloride 100  96 - 112 mEq/L    CO2 25  19 - 32 mEq/L    Glucose, Bld 90  70 - 99 mg/dL    BUN 8  6 - 23 mg/dL    Creatinine, Ser 4.09  0.50 - 1.10 mg/dL    Calcium 9.6  8.4 - 81.1 mg/dL    Total Protein 7.5  6.0 - 8.3 g/dL    Albumin 3.5  3.5 - 5.2 g/dL    AST 13  0 - 37 U/L    ALT 7  0 - 35 U/L    Alkaline Phosphatase 74  39 - 117 U/L    Total Bilirubin 0.2 (*) 0.3 - 1.2 mg/dL    GFR calc non Af Amer >90  >90 mL/min    GFR calc Af Amer >90  >90 mL/min   PROTIME-INR     Status: Normal   Collection Time   03/22/12  9:28 AM      Component Value Range Comment   Prothrombin Time 12.3  11.6 - 15.2 seconds    INR 0.92  0.00 - 1.49    Dg Chest 2 View  03/22/2012  *RADIOLOGY REPORT*  Clinical Data: Cough:  Right carotid occlusion  CHEST - 2 VIEW  Comparison: Chest CT July 29, 2010  Findings: There is no edema or consolidation.  Heart size and pulmonary vascularity are within normal limits.  No adenopathy.   No bone lesions.  IMPRESSION: No edema or consolidation.   Original Report Authenticated By: Bretta Bang, M.D.     Review of Systems  Constitutional: Negative for fever and chills.  HENT: Negative for hearing loss, neck pain and tinnitus.   Respiratory: Negative for shortness of breath.   Cardiovascular: Negative for chest pain.  Gastrointestinal: Negative for nausea, vomiting and abdominal pain.  Neurological: Positive for tingling and headaches. Negative for weakness.       Rt face and hand- resolved    Blood pressure 111/75, pulse 62, resp. rate 18, SpO2 98.00%. Physical Exam  Constitutional: She is oriented to person, place, and time. She appears well-developed and well-nourished.  Eyes: EOM are normal.  Neck: Normal range of motion. Neck supple.  Cardiovascular: Normal rate, regular rhythm and normal heart sounds.   No murmur heard. Respiratory: Effort normal and breath sounds normal. She has no wheezes.  GI: Soft. Bowel sounds are normal. There is no tenderness.  Musculoskeletal: Normal range of motion.  Neurological: She is alert and oriented to person, place, and time.  Skin: Skin is dry.  Psychiatric: She has a normal mood and affect. Her behavior is normal. Judgment and thought content normal.     Assessment/Plan Sxs of headache; Rt facial numbness and Rt hand numbness resolved R ICA on previous angio Scheduled for arteriogram with poss pta/stent with Dr Corliss Skains Pt aware of procedure benefits and risks and agreeable to proceed. Consent signed and in chart Pt aware if we move forward with intervention she will spend night in Neuro ICU Dc in am if stable  Lynsey Ange A 03/23/2012, 7:29 AM

## 2012-03-23 NOTE — Preoperative (Signed)
Beta Blockers   Reason not to administer Beta Blockers:Not Applicable 

## 2012-03-23 NOTE — Anesthesia Postprocedure Evaluation (Signed)
  Anesthesia Post-op Note  Patient: [name]  Procedure(s) Performed: Procedure(s) (LRB) with comments: RADIOLOGY WITH ANESTHESIA (N/A)  Patient Location: Radiology  Anesthesia Type:MAC  Level of Consciousness: awake, alert  and oriented  Airway and Oxygen Therapy: Patient Spontanous Breathing  Post-op Pain: none  Post-op Assessment: Post-op Vital signs reviewed, Patient's Cardiovascular Status Stable, Respiratory Function Stable, Patent Airway, No signs of Nausea or vomiting and Pain level controlled  Post-op Vital Signs: stable  Complications: No apparent anesthesia complications

## 2012-03-23 NOTE — Transfer of Care (Signed)
Immediate Anesthesia Transfer of Care Note  Patient: [name]  Procedure(s) Performed: Procedure(s) (LRB) with comments: RADIOLOGY WITH ANESTHESIA (N/A)  Patient Location: PACU  Anesthesia Type:MAC  Level of Consciousness: awake, alert  and oriented  Airway & Oxygen Therapy: Patient Spontanous Breathing and Patient connected to nasal cannula oxygen  Post-op Assessment: Report given to PACU RN  Post vital signs: Reviewed and stable  Complications: No apparent anesthesia complications

## 2012-03-23 NOTE — Anesthesia Preprocedure Evaluation (Addendum)
Anesthesia Evaluation  Patient identified by MRN, date of birth, ID band Patient awake    Airway Mallampati: I TM Distance: >3 FB Neck ROM: full    Dental  (+) Teeth Intact and Dental Advidsory Given   Pulmonary  breath sounds clear to auscultation        Cardiovascular + Peripheral Vascular Disease Rhythm:Regular Rate:Normal     Neuro/Psych  Headaches, TIACVA, Residual Symptoms    GI/Hepatic Neg liver ROS, PUD, GERD-  Medicated and Controlled,  Endo/Other  negative endocrine ROS  Renal/GU negative Renal ROS     Musculoskeletal   Abdominal   Peds  Hematology   Anesthesia Other Findings   Reproductive/Obstetrics                          Anesthesia Physical Anesthesia Plan  ASA: III  Anesthesia Plan: General   Post-op Pain Management:    Induction: Intravenous  Airway Management Planned: Simple Face Mask  Additional Equipment:   Intra-op Plan:   Post-operative Plan:   Informed Consent: I have reviewed the patients History and Physical, chart, labs and discussed the procedure including the risks, benefits and alternatives for the proposed anesthesia with the patient or authorized representative who has indicated his/her understanding and acceptance.   Dental Advisory Given and Dental advisory given  Plan Discussed with: Anesthesiologist and CRNA  Anesthesia Plan Comments:        Anesthesia Quick Evaluation

## 2012-03-23 NOTE — Progress Notes (Signed)
Report give to [facility] in short Stay.

## 2012-03-23 NOTE — Anesthesia Procedure Notes (Signed)
Procedure Name: MAC Date/Time: 03/23/2012 9:23 AM Performed by: Carmela Rima Pre-anesthesia Checklist: Patient identified, Emergency Drugs available, Suction available, Patient being monitored and Timeout performed Oxygen Delivery Method: Nasal cannula Placement Confirmation: positive ETCO2

## 2012-03-23 NOTE — Progress Notes (Signed)
Nimodipine 60mg  and EC Aspirin 325mg  po given at [phone]

## 2012-03-23 NOTE — Procedures (Signed)
S/P rt common carotid  arteriogram RT CFA approach. Findings. Complete occlusion of RT ICA supraclinoid segment with  moya moya collaterals .Marland Kitchen

## 2012-03-25 ENCOUNTER — Encounter (HOSPITAL_COMMUNITY): Payer: Self-pay | Admitting: Interventional Radiology

## 2012-03-25 ENCOUNTER — Other Ambulatory Visit (HOSPITAL_COMMUNITY): Payer: BC Managed Care – PPO

## 2012-03-28 ENCOUNTER — Ambulatory Visit (HOSPITAL_COMMUNITY): Payer: BC Managed Care – PPO

## 2012-04-18 ENCOUNTER — Other Ambulatory Visit (HOSPITAL_COMMUNITY): Payer: Self-pay | Admitting: Interventional Radiology

## 2012-04-18 DIAGNOSIS — G8929 Other chronic pain: Secondary | ICD-10-CM

## 2012-04-20 ENCOUNTER — Telehealth (HOSPITAL_COMMUNITY): Payer: Self-pay | Admitting: Interventional Radiology

## 2012-04-21 ENCOUNTER — Ambulatory Visit (HOSPITAL_COMMUNITY): Payer: BC Managed Care – PPO

## 2012-04-21 ENCOUNTER — Other Ambulatory Visit (HOSPITAL_COMMUNITY): Payer: Self-pay | Admitting: Interventional Radiology

## 2012-04-21 ENCOUNTER — Telehealth (HOSPITAL_COMMUNITY): Payer: Self-pay | Admitting: Interventional Radiology

## 2012-04-21 DIAGNOSIS — I6529 Occlusion and stenosis of unspecified carotid artery: Secondary | ICD-10-CM

## 2012-04-25 ENCOUNTER — Ambulatory Visit (HOSPITAL_COMMUNITY)
Admission: RE | Admit: 2012-04-25 | Discharge: 2012-04-25 | Disposition: A | Discharge status: Home / Self Care | Payer: BC Managed Care – PPO | Source: Ambulatory Visit | Attending: Interventional Radiology | Admitting: Interventional Radiology

## 2012-04-25 DIAGNOSIS — I6529 Occlusion and stenosis of unspecified carotid artery: Secondary | ICD-10-CM

## 2012-05-09 ENCOUNTER — Other Ambulatory Visit: Payer: Self-pay | Admitting: Neurology

## 2012-05-09 DIAGNOSIS — R51 Headache: Secondary | ICD-10-CM

## 2012-05-13 ENCOUNTER — Ambulatory Visit
Admission: RE | Admit: 2012-05-13 | Discharge: 2012-05-13 | Disposition: A | Discharge status: Home / Self Care | Payer: BC Managed Care – PPO | Source: Ambulatory Visit | Attending: Neurology | Admitting: Neurology

## 2012-05-13 DIAGNOSIS — R51 Headache: Secondary | ICD-10-CM

## 2012-05-13 MED ORDER — GADOBENATE DIMEGLUMINE 529 MG/ML IV SOLN
12.0000 mL | Freq: Once | INTRAVENOUS | Status: AC | PRN
  Administered 2012-05-13: 12 mL via INTRAVENOUS

## 2012-08-12 ENCOUNTER — Other Ambulatory Visit (HOSPITAL_COMMUNITY): Payer: Self-pay | Admitting: Endocrinology

## 2012-08-12 DIAGNOSIS — I779 Disorder of arteries and arterioles, unspecified: Secondary | ICD-10-CM

## 2012-08-18 ENCOUNTER — Ambulatory Visit (HOSPITAL_COMMUNITY)
Admission: RE | Admit: 2012-08-18 | Discharge: 2012-08-18 | Disposition: A | Discharge status: Home / Self Care | Payer: BC Managed Care – PPO | Source: Ambulatory Visit | Attending: Cardiovascular Disease | Admitting: Cardiovascular Disease

## 2012-08-18 DIAGNOSIS — I779 Disorder of arteries and arterioles, unspecified: Secondary | ICD-10-CM | POA: Insufficient documentation

## 2012-08-18 NOTE — Progress Notes (Signed)
Carotid Duplex Imaging Complete [name] 

## 2012-09-09 ENCOUNTER — Other Ambulatory Visit (HOSPITAL_COMMUNITY): Payer: Self-pay | Admitting: Interventional Radiology

## 2012-09-09 ENCOUNTER — Other Ambulatory Visit: Payer: Self-pay | Admitting: Radiology

## 2012-09-09 DIAGNOSIS — I639 Cerebral infarction, unspecified: Secondary | ICD-10-CM

## 2012-09-09 DIAGNOSIS — I771 Stricture of artery: Secondary | ICD-10-CM

## 2012-09-12 ENCOUNTER — Encounter (HOSPITAL_COMMUNITY): Payer: Self-pay

## 2012-09-12 ENCOUNTER — Ambulatory Visit (HOSPITAL_COMMUNITY)
Admission: RE | Admit: 2012-09-12 | Discharge: 2012-09-12 | Disposition: A | Discharge status: Home / Self Care | Payer: BC Managed Care – PPO | Source: Ambulatory Visit | Attending: Interventional Radiology | Admitting: Interventional Radiology

## 2012-09-12 DIAGNOSIS — I63239 Cerebral infarction due to unspecified occlusion or stenosis of unspecified carotid arteries: Secondary | ICD-10-CM | POA: Insufficient documentation

## 2012-09-12 DIAGNOSIS — I639 Cerebral infarction, unspecified: Secondary | ICD-10-CM

## 2012-09-12 DIAGNOSIS — I6320 Cerebral infarction due to unspecified occlusion or stenosis of unspecified precerebral arteries: Secondary | ICD-10-CM | POA: Insufficient documentation

## 2012-09-12 DIAGNOSIS — I771 Stricture of artery: Secondary | ICD-10-CM

## 2012-09-12 LAB — BASIC METABOLIC PANEL
GFR calc Af Amer: >90 mL/min (ref >90)
GFR calc non Af Amer: >90 mL/min (ref >90)
Potassium: 3.5 mEq/L (ref 3.5–5.1)
Sodium: 133 mEq/L — ABNORMAL LOW (ref 135–145)

## 2012-09-12 MED ORDER — IOHEXOL 350 MG/ML SOLN
40.0000 mL | Freq: Once | INTRAVENOUS | Status: AC | PRN
  Administered 2012-09-12: 40 mL via INTRAVENOUS

## 2012-09-14 ENCOUNTER — Telehealth (HOSPITAL_COMMUNITY): Payer: Self-pay | Admitting: Interventional Radiology

## 2012-09-15 ENCOUNTER — Encounter: Payer: Self-pay | Admitting: Cardiovascular Disease

## 2012-09-15 ENCOUNTER — Ambulatory Visit (INDEPENDENT_AMBULATORY_CARE_PROVIDER_SITE_OTHER): Payer: BC Managed Care – PPO | Admitting: Cardiovascular Disease

## 2012-09-15 VITALS — BP 108/60 | HR 84 | Ht 62.0 in | Wt 127.1 lb

## 2012-09-15 DIAGNOSIS — I779 Disorder of arteries and arterioles, unspecified: Secondary | ICD-10-CM

## 2012-09-15 DIAGNOSIS — E785 Hyperlipidemia, unspecified: Secondary | ICD-10-CM

## 2012-09-15 NOTE — Assessment & Plan Note (Signed)
Patient had a stroke in November 2013. Dr. Alinda Money did a swab performed angiography twice in December revealing occluded internal carotid arteries which have been treated medically. Dopplers in the office to confirm this at a recent CT and exam showed delayed filling of the anterior circulation via posterior circulation collaterals. Patient is basically neurologically symptomatically aspirin and Plavix.Marland Kitchen

## 2012-09-15 NOTE — Patient Instructions (Addendum)
Your physician wants you to follow-up in: 6 months with an extender and 12 months with Dr Berry. You will receive a reminder letter in the mail two months in advance. If you don't receive a letter, please call our office to schedule the follow-up appointment.  

## 2012-09-15 NOTE — Progress Notes (Signed)
09/15/2012 [name]   May 22, 1970  [id number]  Primary Physician Iran Ouch Alleen Borne, MD Primary Cardiologist: Runell Gess MD Roseanne Reno   HPI:  [name] is a 42 year old married Caucasian female mother of 3 children accompanied by her daughter Jearld Adjutant today. She is referred by Dr. Casimiro Needle Altheimer for cardiovascular evaluation. Her cardiovascular disease risk factors include include a 30-pack-year history of tobacco use having quit in October 2013. History of hyperlipidemia. Her father died of myocardial infarction at age 59. She's never had a heart attack but has had a stroke. She has good bilateral internal carotid arteries I angiography performed by Dr. Nigel Sloop r. Doppler's office confirm this a recent cerebral CT scan showed delayed filling of the anterior circulation via posterior collaterals. The patient denies chest pain or shortness of breath.   Current Outpatient Prescriptions  Medication Sig Dispense Refill  . amitriptyline (ELAVIL) 10 MG tablet Take 10 mg by mouth at bedtime.      Marland Kitchen aspirin EC 81 MG tablet Take 81 mg by mouth daily.      . ciprofloxacin (CIPRO) 500 MG tablet Take 500 mg by mouth 2 (two) times daily.      . clopidogrel (PLAVIX) 75 MG tablet Take 75 mg by mouth daily.      Marland Kitchen estradiol (VIVELLE-DOT) 0.075 MG/24HR Place 1 patch onto the skin 2 (two) times a week. Not specific days      . ibuprofen (ADVIL,MOTRIN) 200 MG tablet Takes 3-4 tablets as needed for headaches.      Bess Harvest Ethyl (VASCEPA) 1 G CAPS Take 2 capsules by mouth 2 (two) times daily.      . lansoprazole (PREVACID) 30 MG capsule Take 30 mg by mouth daily.      . phenazopyridine (PYRIDIUM) 100 MG tablet Take 100 mg by mouth 4 (four) times daily as needed for pain.       No current facility-administered medications for this visit.    Allergies  Allergen Reactions  . Pantoprazole Sodium Itching and Rash  . Penicillins Itching and Rash  . Sulfa Antibiotics Itching and Rash      History   Social History  . Marital Status: Married    Spouse Name: N/A    Number of Children: N/A  . Years of Education: N/A   Occupational History  . unemployed    Social History Main Topics  . Smoking status: Former Smoker -- 1.00 packs/day for 25 years    Types: Cigarettes    Quit date: 01/12/2012  . Smokeless tobacco: Former Neurosurgeon    Quit date: 03/09/2012     Comment: wellbutrin worked before  . Alcohol Use: 8.4 - 16.8 oz/week    14-28 Cans of beer per week     Comment: drinks 2-4 beers a day; occassional  . Drug Use: No  . Sexually Active: Yes -- Female partner(s)    Birth Control/ Protection: Surgical     Comment: spouse   Other Topics Concern  . Not on file   Social History Narrative  . No narrative on file     Review of Systems: General: negative for chills, fever, night sweats or weight changes.  Cardiovascular: negative for chest pain, dyspnea on exertion, edema, orthopnea, palpitations, paroxysmal nocturnal dyspnea or shortness of breath Dermatological: negative for rash Respiratory: negative for cough or wheezing Urologic: negative for hematuria Abdominal: negative for nausea, vomiting, diarrhea, bright red blood per rectum, melena, or hematemesis Neurologic: negative for visual changes, syncope, or dizziness  All other systems reviewed and are otherwise negative except as noted above.    Blood pressure 108/60, pulse 84, height 5\' 2"  (1.575 m), weight 127 lb 1.6 oz (57.652 kg).  General appearance: alert and no distress Neck: no adenopathy, no carotid bruit, no JVD, supple, symmetrical, trachea midline and thyroid not enlarged, symmetric, no tenderness/mass/nodules Lungs: clear to auscultation bilaterally Heart: regular rate and rhythm, S1, S2 normal, no murmur, click, rub or gallop Abdomen: soft, non-tender; bowel sounds normal; no masses,  no organomegaly Extremities: extremities normal, atraumatic, no cyanosis or edema Pulses: 2+ and  symmetric decreased pedal pulses bilaterally  EKG not performed today  ASSESSMENT AND PLAN:   Carotid artery disease Patient had a stroke in November 2013. Dr. Alinda Money did a swab performed angiography twice in December revealing occluded internal carotid arteries which have been treated medically. Dopplers in the office to confirm this at a recent CT and exam showed delayed filling of the anterior circulation via posterior circulation collaterals. Patient is basically neurologically symptomatically aspirin and Plavix.Marland Kitchen  Hyperlipidemia On Crestor 20 mg daily. Her primary care physician follows this.      Runell Gess MD FACP,FACC,FAHA, [facility] 09/15/2012 2:27 PM

## 2012-09-15 NOTE — Assessment & Plan Note (Signed)
On Crestor 20 mg daily. Her primary care physician follows this.

## 2012-10-19 ENCOUNTER — Encounter: Payer: Self-pay | Admitting: Cardiovascular Disease

## 2013-05-31 ENCOUNTER — Ambulatory Visit (INDEPENDENT_AMBULATORY_CARE_PROVIDER_SITE_OTHER): Payer: BC Managed Care – PPO | Admitting: Gastroenterology

## 2013-05-31 ENCOUNTER — Other Ambulatory Visit: Payer: Self-pay | Admitting: Gastroenterology

## 2013-05-31 ENCOUNTER — Encounter: Payer: Self-pay | Admitting: Gastroenterology

## 2013-05-31 ENCOUNTER — Encounter (INDEPENDENT_AMBULATORY_CARE_PROVIDER_SITE_OTHER): Payer: Self-pay

## 2013-05-31 VITALS — BP 116/76 | HR 76 | Temp 97.3°F | Wt 121.6 lb

## 2013-05-31 DIAGNOSIS — K3189 Other diseases of stomach and duodenum: Secondary | ICD-10-CM

## 2013-05-31 DIAGNOSIS — K279 Peptic ulcer, site unspecified, unspecified as acute or chronic, without hemorrhage or perforation: Secondary | ICD-10-CM

## 2013-05-31 DIAGNOSIS — R1013 Epigastric pain: Principal | ICD-10-CM

## 2013-05-31 DIAGNOSIS — G8929 Other chronic pain: Secondary | ICD-10-CM

## 2013-05-31 MED ORDER — RANITIDINE HCL 300 MG PO TABS: ORAL_TABLET | ORAL | Status: DC

## 2013-05-31 NOTE — Progress Notes (Signed)
 Subjective:    Patient ID: [name], female    DOB: 12/17/1970, 43 y.o.   MRN: [id number]  Strader, Lindsey F, NP  HPI Achy abd pain(above naval)-2-3x/week. EATING HABITS CHANGE: NO FRIED, PIZZA, MCDONALD'S,  OR SPICY FOOD. ACHY PAIN GETS BETTER WITH ONE ALEVE. FEELS NAUSEA WITH OR W/O THE PAIN. APPETITE VARIES: GOOD TO POOR. FEELS LIKE SHE'S FULL ALREADY. NO CHANGE IN BMs. USU SOFT TO LOOSE STOOLS. BMs: once q3 days. Last diarrhea: been awhile.  NO ASA, BC GOODY'S, OR IBUPROFEN. Tylenol does not help. RARE ETOH. LAST EGD APR 2012. Sx stirring up a little bit. Smoking: occasionally. No weight loss.  PT DENIES FEVER, CHILLS, BRBPR, vomiting, melena, diarrhea, constipation,  problems swallowing. Heartburn; off and on, rare. GETS NOSE BLEEDS ON ASA/PLAVIX.  Past Medical History  Diagnosis Date  . Hyperlipidemia   . Cervical cancer   . PUD (peptic ulcer disease)     NSAIDS   . Chronic headaches   . Bronchitis     hx of  . Stenosis of right carotid artery   . TIA (transient ischemic attack)   . GERD (gastroesophageal reflux disease)   . Bleeding ulcer   . 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 Total vaginal hysterectomy    . Bladder suspension    . Esophagogastroduodenoscopy  02/2010    Dr. Sandi Fields. Multiple duodenal ulcers, gastric erosions (bx negative for H.Pylori)  . Multiple cyst removal      coccyx, left ring finger, pelvic area & throat/vocal cord and pilonidal cyst  . Abdominal hysterectomy    . Radiology with anesthesia  03/23/2012    Procedure: RADIOLOGY WITH ANESTHESIA;  Surgeon: Sanjeev K Deveshwar, MD;  Location: [facility] OR;  Service: Radiology;  Laterality: N/A;   Allergies  Allergen Reactions  . Pantoprazole Sodium Itching and Rash  . Penicillins Itching and Rash  . Sulfa Antibiotics Itching and Rash   Current Outpatient Prescriptions  Medication Sig  Dispense Refill  . aspirin EC 81 MG tablet Take 81 mg by mouth daily.      . clopidogrel (PLAVIX) 75 MG tablet Take 75 mg by mouth daily.      . ibuprofen (ADVIL,MOTRIN) 200 MG tablet Takes 3-4 tablets as needed for headaches.      . Icosapent Ethyl (VASCEPA) 1 G CAPS Take 2 capsules by mouth 2 (two) times daily.      . lansoprazole (PREVACID) 30 MG capsule Take 30 mg by mouth daily.      . rosuvastatin (CRESTOR) 20 MG tablet Take 20 mg by mouth daily.      .        .        . estradiol (VIVELLE-DOT) 0.075 MG/24HR Place 1 patch onto the skin 2 (two) times a week. Not specific days      . phenazopyridine (PYRIDIUM) 100 MG tablet Take 100 mg by mouth 4 (four) times daily as needed for pain.          Review of Systems     Objective:   Physical Exam  Vitals reviewed. Constitutional: She is oriented to person, place, and time. She appears well-nourished. No distress.  HENT:  Head: Normocephalic and atraumatic.  Mouth/Throat: Oropharynx is clear and moist. No oropharyngeal exudate.  Eyes: Pupils are equal, round, and reactive to light. No scleral icterus.  Neck: Normal range of motion.   Neck supple.  Cardiovascular: Normal rate, regular rhythm and normal heart sounds.   Pulmonary/Chest: Effort normal and breath sounds normal.  Abdominal: Soft. Bowel sounds are normal. She exhibits no distension. There is tenderness. There is no rebound and no guarding.  MILD TTP IN THE EPIGASTRIUM & BUQS.   Musculoskeletal: She exhibits no edema.  Lymphadenopathy:    She has no cervical adenopathy.  Neurological: She is alert and oriented to person, place, and time.  NO  NEW FOCAL DEFICITS   Psychiatric:  FLAT AFFECT, NL MOOD           Assessment & Plan:

## 2013-05-31 NOTE — Assessment & Plan Note (Addendum)
ASSOCIATED WITH EPIGASTRIC PAIN/NAUSEA. DIFFERENTIAL DIAGNOSIS INCLUDES PUD, GASTRITIS, LESS LIKELY GASTRIC OUTLET OBSTRUCTION,/CA OR DUODENAL STRICTURE  UGI WITH SBFT. IF INDICATED WILL PLAN EGD CONTINUE PREVACID. ZANTAC QHS FOR 30 DAYS THEN EVERY OTHER NIGHT. WOULD INCREASE PREVACID BUT MAY INTERFERE WITH PLAVIX. DISCUSSED WITH PT. LOW FAT DIET OPV IN 4 MOS

## 2013-05-31 NOTE — Patient Instructions (Signed)
BARIUM GI TEST WITHIN THE NEXT 7 DAYS  CONTINUE PREVACID.   USE ZANTAC 30 MG AT BEDTIME FOR 30 DAYS THEN EVERY OTHER NIGHT.  FOLLOW A LOW FAT DIET. SEE INFO BELOW.  FOLLOW UP IN 4 MOS.   Low-Fat Diet BREADS, CEREALS, PASTA, RICE, DRIED PEAS, AND BEANS These products are high in carbohydrates and most are low in fat. Therefore, they can be increased in the diet as substitutes for fatty foods. They too, however, contain calories and should not be eaten in excess. Cereals can be eaten for snacks as well as for breakfast.   FRUITS AND VEGETABLES It is good to eat fruits and vegetables. Besides being sources of fiber, both are rich in vitamins and some minerals. They help you get the daily allowances of these nutrients. Fruits and vegetables can be used for snacks and desserts.  MEATS Limit lean meat, chicken, [place], and fish to no more than 6 ounces per day. Beef, Pork, and Lamb Use lean cuts of beef, pork, and lamb. Lean cuts include:  Extra-lean ground beef.  Arm roast.  Sirloin tip.  Center-cut ham.  Round steak.  Loin chops.  Rump roast.  Tenderloin.  Trim all fat off the outside of meats before cooking. It is not necessary to severely decrease the intake of red meat, but lean choices should be made. Lean meat is rich in protein and contains a highly absorbable form of iron. Premenopausal women, in particular, should avoid reducing lean red meat because this could increase the risk for low red blood cells (iron-deficiency anemia).  Chicken and [place] These are good sources of protein. The fat of poultry can be reduced by removing the skin and underlying fat layers before cooking. Chicken and [place] can be substituted for lean red meat in the diet. Poultry should not be fried or covered with high-fat sauces. Fish and Shellfish Fish is a good source of protein. Shellfish contain cholesterol, but they usually are low in saturated fatty acids. The preparation of fish is important. Like  chicken and [place], they should not be fried or covered with high-fat sauces. EGGS Egg whites contain no fat or cholesterol. They can be eaten often. Try 1 to 2 egg whites instead of whole eggs in recipes or use egg substitutes that do not contain yolk. MILK AND DAIRY PRODUCTS Use skim or 1% milk instead of 2% or whole milk. Decrease whole milk, natural, and processed cheeses. Use nonfat or low-fat (2%) cottage cheese or low-fat cheeses made from vegetable oils. Choose nonfat or low-fat (1 to 2%) yogurt. Experiment with evaporated skim milk in recipes that call for heavy cream. Substitute low-fat yogurt or low-fat cottage cheese for sour cream in dips and salad dressings. Have at least 2 servings of low-fat dairy products, such as 2 glasses of skim (or 1%) milk each day to help get your daily calcium intake. FATS AND OILS Reduce the total intake of fats, especially saturated fat. Butterfat, lard, and beef fats are high in saturated fat and cholesterol. These should be avoided as much as possible. Vegetable fats do not contain cholesterol, but certain vegetable fats, such as coconut oil, palm oil, and palm kernel oil are very high in saturated fats. These should be limited. These fats are often used in bakery goods, processed foods, popcorn, oils, and nondairy creamers. Vegetable shortenings and some peanut butters contain hydrogenated oils, which are also saturated fats. Read the labels on these foods and check for saturated vegetable oils. Unsaturated vegetable oils  and fats do not raise blood cholesterol. However, they should be limited because they are fats and are high in calories. Total fat should still be limited to 30% of your daily caloric intake. Desirable liquid vegetable oils are corn oil, cottonseed oil, olive oil, canola oil, safflower oil, soybean oil, and sunflower oil. Peanut oil is not as good, but small amounts are acceptable. Buy a heart-healthy tub margarine that has no partially  hydrogenated oils in the ingredients. Mayonnaise and salad dressings often are made from unsaturated fats, but they should also be limited because of their high calorie and fat content. Seeds, nuts, peanut butter, olives, and avocados are high in fat, but the fat is mainly the unsaturated type. These foods should be limited mainly to avoid excess calories and fat. OTHER EATING TIPS Snacks  Most sweets should be limited as snacks. They tend to be rich in calories and fats, and their caloric content outweighs their nutritional value. Some good choices in snacks are graham crackers, melba toast, soda crackers, bagels (no egg), English muffins, fruits, and vegetables. These snacks are preferable to snack crackers, [place] fries, TORTILLA CHIPS, and POTATO chips. Popcorn should be air-popped or cooked in small amounts of liquid vegetable oil. Desserts Eat fruit, low-fat yogurt, and fruit ices instead of pastries, cake, and cookies. Sherbet, angel food cake, gelatin dessert, frozen low-fat yogurt, or other frozen products that do not contain saturated fat (pure fruit juice bars, frozen ice pops) are also acceptable.  COOKING METHODS Choose those methods that use little or no fat. They include: Poaching.  Braising.  Steaming.  Grilling.  Baking.  Stir-frying.  Broiling.  Microwaving.  Foods can be cooked in a nonstick pan without added fat, or use a nonfat cooking spray in regular cookware. Limit fried foods and avoid frying in saturated fat. Add moisture to lean meats by using water, broth, cooking wines, and other nonfat or low-fat sauces along with the cooking methods mentioned above. Soups and stews should be chilled after cooking. The fat that forms on top after a few hours in the refrigerator should be skimmed off. When preparing meals, avoid using excess salt. Salt can contribute to raising blood pressure in some people.  EATING AWAY FROM HOME Order entres, potatoes, and vegetables without  sauces or butter. When meat exceeds the size of a deck of cards (3 to 4 ounces), the rest can be taken home for another meal. Choose vegetable or fruit salads and ask for low-calorie salad dressings to be served on the side. Use dressings sparingly. Limit high-fat toppings, such as bacon, crumbled eggs, cheese, sunflower seeds, and olives. Ask for heart-healthy tub margarine instead of butter.

## 2013-06-01 NOTE — Progress Notes (Signed)
cc'd to pcp 

## 2013-06-07 NOTE — Progress Notes (Signed)
Reminder in epic °

## 2013-06-08 ENCOUNTER — Inpatient Hospital Stay (HOSPITAL_COMMUNITY): Admission: RE | Admit: 2013-06-08 | Payer: BC Managed Care – PPO | Source: Ambulatory Visit

## 2013-06-13 ENCOUNTER — Ambulatory Visit (HOSPITAL_COMMUNITY)
Admission: RE | Admit: 2013-06-13 | Discharge: 2013-06-13 | Disposition: A | Discharge status: Home / Self Care | Payer: BC Managed Care – PPO | Source: Ambulatory Visit | Attending: Gastroenterology | Admitting: Gastroenterology

## 2013-06-13 DIAGNOSIS — R1013 Epigastric pain: Secondary | ICD-10-CM | POA: Insufficient documentation

## 2013-06-13 DIAGNOSIS — G8929 Other chronic pain: Secondary | ICD-10-CM | POA: Insufficient documentation

## 2013-06-13 DIAGNOSIS — R630 Anorexia: Secondary | ICD-10-CM | POA: Insufficient documentation

## 2013-06-14 ENCOUNTER — Telehealth: Payer: Self-pay | Admitting: Neurology

## 2013-06-14 ENCOUNTER — Telehealth: Payer: Self-pay | Admitting: Gastroenterology

## 2013-06-14 NOTE — Telephone Encounter (Signed)
Pt called this afternoon saying that she was told that we would have her results from Radiology today and she was checking on those. I told her that it takes 5-7 business days for [place] to get the results back and for the doctor to review and the nurse would be in touch with her once that's done. She also said that she is wanting [facility] to switch her from Prilosec to Prevacid and she is completely out and would we call it in to [facility]. I told her that I would document her request and someone would call her back at [phone]

## 2013-06-14 NOTE — Telephone Encounter (Signed)
If the patient is to be seen, she will make the appointment, pay the fee.

## 2013-06-14 NOTE — Telephone Encounter (Signed)
I called pt and she is fine not to get results until they are ready. She said she was just told in radiology to call today for results.   She also wants Dr. Oneida Alar to send her in a prescription for the Prevacid 30 mg qd. She said her PCP, Erma Pinto, put her on it and it helped a lot more. Juliann Pulse got sick and moved away.

## 2013-06-14 NOTE — Telephone Encounter (Signed)
Patient is calling requesting refill on Plavix, patient has not been seen since 2013 by Dr. Jannifer Franklin. Patient was told that she had a no show fee and would need to pay it before a new appt could be made. Patient stated that she did not cancel the old appt and was told she needed to speak with billing, patient hung up. What would you like to do? Please advise

## 2013-06-14 NOTE — Telephone Encounter (Signed)
PT called in for a refill on the Plavix, however she has not been seen by Dr. Jannifer Franklin since 2013. Per [employer] she has a "No Show" fee and that would need to be paid before a new appointment could be made. Pt stated she did call in to cancel the old appointment.  Told her I would still have to send her to Billing before we could do anything. She hung up before I could transfer the call.  This is an FYI should the PT call back.

## 2013-06-15 ENCOUNTER — Telehealth: Payer: Self-pay | Admitting: Gastroenterology

## 2013-06-15 ENCOUNTER — Encounter: Payer: Self-pay | Admitting: Gastroenterology

## 2013-06-15 NOTE — Telephone Encounter (Signed)
PLEASE CALL PT. HER UGI STUDY SHOWS HER STOMACH AND SMALL BOWEL EMPTY QUICKLY AFTER ORAL INTAKE. HER ESOPHAGUS SHOWS MILD IMPAIRMENT MOVING THING FROM HER MOUTH TO HER STOMACH. SHE NEEDS TO HAVE AN EGD NEXT 1-2 WEEKS. WE HAVE AN OPENING MON MAR 9. SHE NEEDS PHENERGAN 25 MG IV IN PREOP.

## 2013-06-16 ENCOUNTER — Encounter (HOSPITAL_COMMUNITY): Payer: Self-pay | Admitting: Pharmacy Technician

## 2013-06-16 ENCOUNTER — Other Ambulatory Visit: Payer: Self-pay | Admitting: Internal Medicine

## 2013-06-16 ENCOUNTER — Other Ambulatory Visit: Payer: Self-pay | Admitting: Gastroenterology

## 2013-06-16 DIAGNOSIS — R933 Abnormal findings on diagnostic imaging of other parts of digestive tract: Secondary | ICD-10-CM

## 2013-06-16 NOTE — Telephone Encounter (Signed)
I called pt. LMOM for her to call ASAP that Dr. Oneida Alar would like to schedule her for a procedure on 06/19/2013. We do close at 12:00 noon today.

## 2013-06-16 NOTE — Telephone Encounter (Signed)
EGD W/SLF ON 3/9

## 2013-06-16 NOTE — Telephone Encounter (Signed)
EGD is scheduled w/SLF on Monday March 16th at 11:00 I have put her instructions in the mail to her

## 2013-06-16 NOTE — Telephone Encounter (Signed)
Pt returned call and she does want to have the procedure on 06/19/2013.

## 2013-06-19 ENCOUNTER — Encounter (HOSPITAL_COMMUNITY)
Admission: RE | Disposition: A | Discharge status: Home / Self Care | Payer: Self-pay | Source: Ambulatory Visit | Attending: Gastroenterology

## 2013-06-19 ENCOUNTER — Encounter (HOSPITAL_COMMUNITY): Payer: Self-pay

## 2013-06-19 ENCOUNTER — Ambulatory Visit (HOSPITAL_COMMUNITY)
Admission: RE | Admit: 2013-06-19 | Discharge: 2013-06-19 | Disposition: A | Discharge status: Home / Self Care | Payer: BC Managed Care – PPO | Source: Ambulatory Visit | Attending: Gastroenterology | Admitting: Gastroenterology

## 2013-06-19 DIAGNOSIS — K3189 Other diseases of stomach and duodenum: Secondary | ICD-10-CM | POA: Insufficient documentation

## 2013-06-19 DIAGNOSIS — R1013 Epigastric pain: Secondary | ICD-10-CM | POA: Insufficient documentation

## 2013-06-19 DIAGNOSIS — F17200 Nicotine dependence, unspecified, uncomplicated: Secondary | ICD-10-CM | POA: Insufficient documentation

## 2013-06-19 DIAGNOSIS — Z7982 Long term (current) use of aspirin: Secondary | ICD-10-CM | POA: Insufficient documentation

## 2013-06-19 DIAGNOSIS — K2960 Other gastritis without bleeding: Secondary | ICD-10-CM | POA: Insufficient documentation

## 2013-06-19 DIAGNOSIS — E785 Hyperlipidemia, unspecified: Secondary | ICD-10-CM | POA: Insufficient documentation

## 2013-06-19 DIAGNOSIS — R110 Nausea: Secondary | ICD-10-CM | POA: Insufficient documentation

## 2013-06-19 DIAGNOSIS — R933 Abnormal findings on diagnostic imaging of other parts of digestive tract: Secondary | ICD-10-CM

## 2013-06-19 DIAGNOSIS — K2980 Duodenitis without bleeding: Secondary | ICD-10-CM | POA: Insufficient documentation

## 2013-06-19 DIAGNOSIS — K2970 Gastritis, unspecified, without bleeding: Secondary | ICD-10-CM

## 2013-06-19 DIAGNOSIS — K2990 Gastroduodenitis, unspecified, without bleeding: Secondary | ICD-10-CM

## 2013-06-19 HISTORY — PX: ESOPHAGOGASTRODUODENOSCOPY: SHX5428

## 2013-06-19 SURGERY — EGD (ESOPHAGOGASTRODUODENOSCOPY)
Anesthesia: Moderate Sedation

## 2013-06-19 MED ORDER — MEPERIDINE HCL 100 MG/ML IJ SOLN
INTRAMUSCULAR | Status: AC
  Filled 2013-06-19: qty 2

## 2013-06-19 MED ORDER — MIDAZOLAM HCL 5 MG/5ML IJ SOLN
INTRAMUSCULAR | Status: DC | PRN
  Administered 2013-06-19 (×2): 2 mg via INTRAVENOUS
  Administered 2013-06-19: 1 mg via INTRAVENOUS

## 2013-06-19 MED ORDER — PROMETHAZINE HCL 25 MG/ML IJ SOLN
INTRAMUSCULAR | Status: AC
  Filled 2013-06-19: qty 1

## 2013-06-19 MED ORDER — STERILE WATER FOR IRRIGATION IR SOLN
Status: DC | PRN
  Administered 2013-06-19: 13:00:00

## 2013-06-19 MED ORDER — LANSOPRAZOLE 30 MG PO CPDR: DELAYED_RELEASE_CAPSULE | ORAL | Status: DC

## 2013-06-19 MED ORDER — FENTANYL CITRATE 0.05 MG/ML IJ SOLN
INTRAMUSCULAR | Status: AC
  Filled 2013-06-19: qty 4

## 2013-06-19 MED ORDER — SODIUM CHLORIDE 0.9 % IV SOLN
INTRAVENOUS | Status: DC
  Administered 2013-06-19: 12:00:00 via INTRAVENOUS

## 2013-06-19 MED ORDER — FENTANYL CITRATE 0.05 MG/ML IJ SOLN
INTRAMUSCULAR | Status: DC | PRN
  Administered 2013-06-19 (×2): 25 ug via INTRAVENOUS
  Administered 2013-06-19: 50 ug via INTRAVENOUS

## 2013-06-19 MED ORDER — SODIUM CHLORIDE 0.9 % IJ SOLN
INTRAMUSCULAR | Status: AC
Start: 2013-06-19 — End: 2013-06-19
  Filled 2013-06-19: qty 10

## 2013-06-19 MED ORDER — LIDOCAINE VISCOUS 2 % MT SOLN
OROMUCOSAL | Status: AC
  Filled 2013-06-19: qty 15

## 2013-06-19 MED ORDER — LIDOCAINE VISCOUS 2 % MT SOLN
OROMUCOSAL | Status: DC | PRN
  Administered 2013-06-19: 1 via OROMUCOSAL

## 2013-06-19 MED ORDER — CLOPIDOGREL BISULFATE 75 MG PO TABS: 75.0000 mg | ORAL_TABLET | Freq: Every day | ORAL | Status: AC

## 2013-06-19 MED ORDER — PROMETHAZINE HCL 25 MG/ML IJ SOLN
25.0000 mg | Freq: Once | INTRAMUSCULAR | Status: AC
  Administered 2013-06-19: 25 mg via INTRAVENOUS

## 2013-06-19 MED ORDER — MIDAZOLAM HCL 5 MG/5ML IJ SOLN
INTRAMUSCULAR | Status: AC
  Filled 2013-06-19: qty 10

## 2013-06-19 NOTE — H&P (View-Only) (Signed)
Subjective:    Patient ID: [name], female    DOB: [birth year], 43 y.o.   MRN: [id number]  Tonya Rival, NP  HPI Achy abd pain(above naval)-2-3x/week. EATING HABITS CHANGE: NO FRIED, PIZZA, MCDONALD'S,  OR SPICY FOOD. ACHY PAIN GETS BETTER WITH ONE ALEVE. FEELS NAUSEA WITH OR W/O THE PAIN. APPETITE VARIES: GOOD TO POOR. FEELS LIKE SHE'S FULL ALREADY. NO CHANGE IN BMs. USU SOFT TO LOOSE STOOLS. BMs: once q3 days. Last diarrhea: been awhile.  NO ASA, BC GOODY'S, OR IBUPROFEN. Tylenol does not help. RARE ETOH. LAST EGD APR 2012. Sx stirring up a little bit. Smoking: occasionally. No weight loss.  PT DENIES FEVER, CHILLS, BRBPR, vomiting, melena, diarrhea, constipation,  problems swallowing. Heartburn; off and on, rare. GETS NOSE BLEEDS ON ASA/PLAVIX.  Past Medical History  Diagnosis Date  . Hyperlipidemia   . Cervical cancer   . PUD (peptic ulcer disease)     NSAIDS   . Chronic headaches   . Bronchitis     hx of  . Stenosis of right carotid artery   . TIA (transient ischemic attack)   . GERD (gastroesophageal reflux disease)   . Bleeding ulcer   . 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 Total vaginal hysterectomy    . Bladder suspension    . Esophagogastroduodenoscopy  02/2010    Dr. Barney Drain. Multiple duodenal ulcers, gastric erosions (bx negative for H.Pylori)  . Multiple cyst removal      coccyx, left ring finger, pelvic area & throat/vocal cord and pilonidal cyst  . Abdominal hysterectomy    . Radiology with anesthesia  03/23/2012    Procedure: RADIOLOGY WITH ANESTHESIA;  Surgeon: Rob Hickman, MD;  Location: [facility];  Service: Radiology;  Laterality: N/A;   Allergies  Allergen Reactions  . Pantoprazole Sodium Itching and Rash  . Penicillins Itching and Rash  . Sulfa Antibiotics Itching and Rash   Current Outpatient Prescriptions  Medication Sig  Dispense Refill  . aspirin EC 81 MG tablet Take 81 mg by mouth daily.      . clopidogrel (PLAVIX) 75 MG tablet Take 75 mg by mouth daily.      Marland Kitchen ibuprofen (ADVIL,MOTRIN) 200 MG tablet Takes 3-4 tablets as needed for headaches.      Vanessa Kick Ethyl (VASCEPA) 1 G CAPS Take 2 capsules by mouth 2 (two) times daily.      . lansoprazole (PREVACID) 30 MG capsule Take 30 mg by mouth daily.      . rosuvastatin (CRESTOR) 20 MG tablet Take 20 mg by mouth daily.      .        .        . estradiol (VIVELLE-DOT) 0.075 MG/24HR Place 1 patch onto the skin 2 (two) times a week. Not specific days      . phenazopyridine (PYRIDIUM) 100 MG tablet Take 100 mg by mouth 4 (four) times daily as needed for pain.          Review of Systems     Objective:   Physical Exam  Vitals reviewed. Constitutional: She is oriented to person, place, and time. She appears well-nourished. No distress.  HENT:  Head: Normocephalic and atraumatic.  Mouth/Throat: Oropharynx is clear and moist. No oropharyngeal exudate.  Eyes: Pupils are equal, round, and reactive to light. No scleral icterus.  Neck: Normal range of motion.  Neck supple.  Cardiovascular: Normal rate, regular rhythm and normal heart sounds.   Pulmonary/Chest: Effort normal and breath sounds normal.  Abdominal: Soft. Bowel sounds are normal. She exhibits no distension. There is tenderness. There is no rebound and no guarding.  MILD TTP IN THE EPIGASTRIUM & BUQS.   Musculoskeletal: She exhibits no edema.  Lymphadenopathy:    She has no cervical adenopathy.  Neurological: She is alert and oriented to person, place, and time.  NO  NEW FOCAL DEFICITS   Psychiatric:  FLAT AFFECT, NL MOOD           Assessment & Plan:

## 2013-06-19 NOTE — Discharge Instructions (Signed)
You have gastritis & DUODENITIS FROM USING ASPIRIN AND ALEVE. I biopsied your stomach. YOUR ESOPHAGUS IS NORMAL.   TAKE PREVACID 30 MINUTES PRIOR TO MEALS TWICE DAILY.  I SENT NEW PRECRIPTIONS FOR PLAVIX AND PREVACID.  AVOID TRIGGERS FOR GASTRITIS WHEN POSSIBLE. SEE INFO BELOW.  FOLLOW A LOW FAT DIET. SEE INFO BELOW.  FOLLOW UP IN July 2015.  UPPER ENDOSCOPY AFTER CARE Read the instructions outlined below and refer to this sheet in the next week. These discharge instructions provide you with general information on caring for yourself after you leave the hospital. While your treatment has been planned according to the most current medical practices available, unavoidable complications occasionally occur. If you have any problems or questions after discharge, call DR. Cinthia Rodden, [phone].  ACTIVITY  You may resume your regular activity, but move at a slower pace for the next 24 hours.   Take frequent rest periods for the next 24 hours.   Walking will help get rid of the air and reduce the bloated feeling in your belly (abdomen).   No driving for 24 hours (because of the medicine (anesthesia) used during the test).   You may shower.   Do not sign any important legal documents or operate any machinery for 24 hours (because of the anesthesia used during the test).    NUTRITION  Drink plenty of fluids.   You may resume your normal diet as instructed by your doctor.   Begin with a light meal and progress to your normal diet. Heavy or fried foods are harder to digest and may make you feel sick to your stomach (nauseated).   Avoid alcoholic beverages for 24 hours or as instructed.    MEDICATIONS  You may resume your normal medications.   WHAT YOU CAN EXPECT TODAY  Some feelings of bloating in the abdomen.   Passage of more gas than usual.    IF YOU HAD A BIOPSY TAKEN DURING THE UPPER ENDOSCOPY:  Eat a soft diet IF YOU HAVE NAUSEA, BLOATING, ABDOMINAL PAIN, OR  VOMITING.    FINDING OUT THE RESULTS OF YOUR TEST Not all test results are available during your visit. DR. Oneida Alar WILL CALL YOU WITHIN 7 DAYS OF YOUR PROCEDUE WITH YOUR RESULTS. Do not assume everything is normal if you have not heard from DR. Saadiya Wilfong IN ONE WEEK, CALL HER OFFICE AT [phone].  SEEK IMMEDIATE MEDICAL ATTENTION AND CALL THE OFFICE: [phone] IF:  You have more than a spotting of blood in your stool.   Your belly is swollen (abdominal distention).   You are nauseated or vomiting.   You have a temperature over 101F.   You have abdominal pain or discomfort that is severe or gets worse throughout the day.   Gastritis/DUODENITIS  Gastritis is an inflammation (the body's way of reacting to injury and/or infection) of the stomach. DUODENITIS is an inflammation (the body's way of reacting to injury and/or infection) of the FIRST PART OF THE SMALL INTESTINES. It is often caused by bacterial (germ) infections. It can also be caused BY ASPIRIN, BC/GOODY POWDER'S, (IBUPROFEN) MOTRIN, OR ALEVE (NAPROXEN), chemicals (including alcohol), SPICY FOODS, and medications. This illness may be associated with generalized malaise (feeling tired, not well), UPPER ABDOMINAL STOMACH cramps, and fever. One common bacterial cause of gastritis is an organism known as H. Pylori. This can be treated with antibiotics.   Low-Fat Diet  BREADS, CEREALS, PASTA, RICE, DRIED PEAS, AND BEANS These products are high in carbohydrates and most are low in  fat. Therefore, they can be increased in the diet as substitutes for fatty foods. They too, however, contain calories and should not be eaten in excess. Cereals can be eaten for snacks as well as for breakfast.  Include foods that contain fiber (fruits, vegetables, whole grains, and legumes). Research shows that fiber may lower blood cholesterol levels, especially the water-soluble fiber found in fruits, vegetables, oat products, and legumes.  FRUITS AND  VEGETABLES It is good to eat fruits and vegetables. Besides being sources of fiber, both are rich in vitamins and some minerals. They help you get the daily allowances of these nutrients. Fruits and vegetables can be used for snacks and desserts.  MEATS Limit lean meat, chicken, [place], and fish to no more than 6 ounces per day.  Beef, Pork, and Lamb Use lean cuts of beef, pork, and lamb. Lean cuts include:  Extra-lean ground beef.  Arm roast.  Sirloin tip.  Center-cut ham.  Round steak.  Loin chops.  Rump roast.  Tenderloin.  Trim all fat off the outside of meats before cooking. It is not necessary to severely decrease the intake of red meat, but lean choices should be made. Lean meat is rich in protein and contains a highly absorbable form of iron. Premenopausal women, in particular, should avoid reducing lean red meat because this could increase the risk for low red blood cells (iron-deficiency anemia).  Chicken and [place] These are good sources of protein. The fat of poultry can be reduced by removing the skin and underlying fat layers before cooking. Chicken and [place] can be substituted for lean red meat in the diet. Poultry should not be fried or covered with high-fat sauces.  Fish and Shellfish Fish is a good source of protein. Shellfish contain cholesterol, but they usually are low in saturated fatty acids. The preparation of fish is important. Like chicken and [place], they should not be fried or covered with high-fat sauces.  EGGS Egg whites contain no fat or cholesterol. They can be eaten often. Try 1 to 2 egg whites instead of whole eggs in recipes or use egg substitutes that do not contain yolk.  MILK AND DAIRY PRODUCTS Use skim or 1% milk instead of 2% or whole milk. Decrease whole milk, natural, and processed cheeses. Use nonfat or low-fat (2%) cottage cheese or low-fat cheeses made from vegetable oils. Choose nonfat or low-fat (1 to 2%) yogurt. Experiment with evaporated  skim milk in recipes that call for heavy cream. Substitute low-fat yogurt or low-fat cottage cheese for sour cream in dips and salad dressings. Have at least 2 servings of low-fat dairy products, such as 2 glasses of skim (or 1%) milk each day to help get your daily calcium intake.  FATS AND OILS Reduce the total intake of fats, especially saturated fat. Butterfat, lard, and beef fats are high in saturated fat and cholesterol. These should be avoided as much as possible. Vegetable fats do not contain cholesterol, but certain vegetable fats, such as coconut oil, palm oil, and palm kernel oil are very high in saturated fats. These should be limited. These fats are often used in bakery goods, processed foods, popcorn, oils, and nondairy creamers. Vegetable shortenings and some peanut butters contain hydrogenated oils, which are also saturated fats. Read the labels on these foods and check for saturated vegetable oils.  Unsaturated vegetable oils and fats do not raise blood cholesterol. However, they should be limited because they are fats and are high in calories. Total fat should still  be limited to 30% of your daily caloric intake. Desirable liquid vegetable oils are corn oil, cottonseed oil, olive oil, canola oil, safflower oil, soybean oil, and sunflower oil. Peanut oil is not as good, but small amounts are acceptable. Buy a heart-healthy tub margarine that has no partially hydrogenated oils in the ingredients. Mayonnaise and salad dressings often are made from unsaturated fats, but they should also be limited because of their high calorie and fat content. Seeds, nuts, peanut butter, olives, and avocados are high in fat, but the fat is mainly the unsaturated type. These foods should be limited mainly to avoid excess calories and fat.  OTHER EATING TIPS Snacks  Most sweets should be limited as snacks. They tend to be rich in calories and fats, and their caloric content outweighs their nutritional value.  Some good choices in snacks are graham crackers, melba toast, soda crackers, bagels (no egg), English muffins, fruits, and vegetables. These snacks are preferable to snack crackers, [place] fries, and chips. Popcorn should be air-popped or cooked in small amounts of liquid vegetable oil.  Desserts Eat fruit, low-fat yogurt, and fruit ices instead of pastries, cake, and cookies. Sherbet, angel food cake, gelatin dessert, frozen low-fat yogurt, or other frozen products that do not contain saturated fat (pure fruit juice bars, frozen ice pops) are also acceptable.   COOKING METHODS Choose those methods that use little or no fat. They include: Poaching.  Braising.  Steaming.  Grilling.  Baking.  Stir-frying.  Broiling.  Microwaving.  Foods can be cooked in a nonstick pan without added fat, or use a nonfat cooking spray in regular cookware. Limit fried foods and avoid frying in saturated fat. Add moisture to lean meats by using water, broth, cooking wines, and other nonfat or low-fat sauces along with the cooking methods mentioned above. Soups and stews should be chilled after cooking. The fat that forms on top after a few hours in the refrigerator should be skimmed off. When preparing meals, avoid using excess salt. Salt can contribute to raising blood pressure in some people.  EATING AWAY FROM HOME Order entres, potatoes, and vegetables without sauces or butter. When meat exceeds the size of a deck of cards (3 to 4 ounces), the rest can be taken home for another meal. Choose vegetable or fruit salads and ask for low-calorie salad dressings to be served on the side. Use dressings sparingly. Limit high-fat toppings, such as bacon, crumbled eggs, cheese, sunflower seeds, and olives. Ask for heart-healthy tub margarine instead of butter.

## 2013-06-19 NOTE — Op Note (Signed)
[facility] [address] [place], [address]   ENDOSCOPY PROCEDURE REPORT  PATIENT: [name], [name]  MR#: [id number] BIRTHDATE: [birth year] , 42  yrs. old GENDER: Female  ENDOSCOPIST: Barney Drain, MD REFERRED VF:IEPPIRJ Strader, NP  PROCEDURE DATE: 06/19/2013 PROCEDURE:   EGD w/ biopsy INDICATIONS:Nausea.   Dyspepsia.   Epigastric pain. MEDICATIONS: Fentanyl 100 mcg IV, Versed 5 mg IV, and PREOP: Promethazine (Phenergan) 25mg  IV TOPICAL ANESTHETIC:   Viscous Xylocaine  DESCRIPTION OF PROCEDURE:     Physical exam was performed.  Informed consent was obtained from the patient after explaining the benefits, risks, and alternatives to the procedure.  The patient was connected to the monitor and placed in the left lateral position.  Continuous oxygen was provided by nasal cannula and IV medicine administered through an indwelling cannula.  After administration of sedation, the patients esophagus was intubated and the EG-[id number] ([id number])  endoscope was advanced under direct visualization to the second portion of the duodenum.  The scope was removed slowly by carefully examining the color, texture, anatomy, and integrity of the mucosa on the way out.  The patient was recovered in endoscopy and discharged home in satisfactory condition.   ESOPHAGUS: The mucosa of the esophagus appeared normal.   STOMACH: Mild erosive gastritis (inflammation) was found in the gastric antrum.  Multiple biopsies were performed using cold forceps. DUODENUM: Moderate duodenal inflammation was found in the duodenal bulb.   The duodenal mucosa showed no abnormalities in the 2nd part of the duodenum. COMPLICATIONS:   None  ENDOSCOPIC IMPRESSION: 1.   NAUSEA/DYSPEPSIA DUE TO UNCONTROLLED GERD, & NSAID GASTRITIS/DUODENITIS  RECOMMENDATIONS: TAKE PREVACID 30 MINUTES PRIOR TO MEALS TWICE DAILY. NEW PRECRIPTIONS FOR PLAVIX AND PREVACID SENT. AVOID TRIGGERS FOR GASTRITIS WHEN  POSSIBLE. FOLLOW A LOW FAT DIET. FOLLOW UP IN July 2015.   REPEAT EXAM:   _______________________________ Lorrin MaisBarney Drain, MD 06/19/2013 2:31 PM

## 2013-06-19 NOTE — Interval H&P Note (Signed)
History and Physical Interval Note:  06/19/2013 12:23 PM  [name]  has presented today for surgery, with the diagnosis of ABNORMAL UGI  The various methods of treatment have been discussed with the patient and family. After consideration of risks, benefits and other options for treatment, the patient has consented to  Procedure(s) with comments: ESOPHAGOGASTRODUODENOSCOPY (EGD) (N/A) - 11:00-rescheduled to100 Leigh Ann to notify pt as a surgical intervention .  The patient's history has been reviewed, patient examined, no change in status, stable for surgery.  I have reviewed the patient's chart and labs.  Questions were answered to the patient's satisfaction.     [employer]

## 2013-06-19 NOTE — Telephone Encounter (Signed)
REVIEWED.  

## 2013-06-21 ENCOUNTER — Telehealth: Payer: Self-pay | Admitting: Gastroenterology

## 2013-06-21 NOTE — Telephone Encounter (Addendum)
Please call pt. HER stomach Bx shows SHE DOES NOT HAVE H PYLORI. HER SYMPTOMS ARE MOST LIKELY DUE TO UNCONTROLLED REFLUX. SHE SHOULD CALL IN OME MO. IF HER SYMPTOMS ARE NOT IMPROVED, SHE WILL NEED A CMP, LIPASE, AND CT ABD. OPV E30 EPIGASTRIC PAIN/GERD IN JUL 2015.

## 2013-06-21 NOTE — Telephone Encounter (Signed)
I called and informed pt.

## 2013-06-26 ENCOUNTER — Encounter (HOSPITAL_COMMUNITY): Payer: Self-pay | Admitting: Gastroenterology

## 2013-06-26 NOTE — Telephone Encounter (Signed)
Reminder in epic °

## 2013-07-24 ENCOUNTER — Telehealth: Payer: Self-pay | Admitting: Obstetrics & Gynecology

## 2013-07-24 MED ORDER — ESTRADIOL 1 MG/GM TD GEL: 1.0000 | Freq: Every day | TRANSDERMAL | Status: DC

## 2013-07-24 NOTE — Telephone Encounter (Signed)
Pt was was given divagel trial pack by Dr. Elonda Husky requesting refill.

## 2013-08-09 ENCOUNTER — Telehealth: Payer: Self-pay | Admitting: Obstetrics & Gynecology

## 2013-08-09 NOTE — Telephone Encounter (Signed)
Pt informed Estradiol gel e-scribed on 07/24/2013, will f/u with [facility].

## 2013-08-10 NOTE — Telephone Encounter (Signed)
Spoke with pharmacist from [facility] states did not get Rx for Estradiol 1mg , gave verbal order by phone per order in Epic by Dr. Elonda Husky on 07/24/2013. Pt aware RX called in.

## 2013-10-04 ENCOUNTER — Encounter: Payer: Self-pay | Admitting: Gastroenterology

## 2013-11-22 ENCOUNTER — Encounter: Payer: Self-pay | Admitting: Gastroenterology

## 2013-11-22 ENCOUNTER — Ambulatory Visit (INDEPENDENT_AMBULATORY_CARE_PROVIDER_SITE_OTHER): Payer: BC Managed Care – PPO | Admitting: Gastroenterology

## 2013-11-22 ENCOUNTER — Encounter (INDEPENDENT_AMBULATORY_CARE_PROVIDER_SITE_OTHER): Payer: Self-pay

## 2013-11-22 VITALS — BP 106/70 | HR 70 | Temp 98.0°F | Resp 18 | Ht 62.0 in | Wt 106.0 lb

## 2013-11-22 DIAGNOSIS — R1013 Epigastric pain: Secondary | ICD-10-CM

## 2013-11-22 DIAGNOSIS — K3189 Other diseases of stomach and duodenum: Secondary | ICD-10-CM

## 2013-11-22 NOTE — Assessment & Plan Note (Signed)
SX CLINICALLY IMPROVED AND MAY BE  DUE TO GASTRITIS/DUODENITIS AND REPORTED ADRENAL INSUFFICIENCY.  CONTINUE TO MONITOR WEIGHT. FOLLOW UP WITH [facility]. CONTINUE PREVACID AND ZANTAC. FOLLOW UP IN 6 MOS. MERRY CHRISTMAS AND HAPPY NEW YEAR!

## 2013-11-22 NOTE — Progress Notes (Signed)
Reminder in epic °

## 2013-11-22 NOTE — Patient Instructions (Signed)
CONTINUE TO MONITOR YOUR WEIGHT. ADD BOOST, ENSURE , OR CARNATION INSTANT BREAKFAST WITH SOY MILK 3 CANS OR FOUR TIMES DAILY.  FOLLOW UP WITH [facility]  CONTINUE PREVACID AND ZANTAC.  FOLLOW UP IN 6 MOS. MERRY CHRISTMAS AND HAPPY NEW YEAR!

## 2013-11-22 NOTE — Progress Notes (Signed)
Subjective:    Patient ID: KHRISTEN CHEYNEY, female    DOB: 05-11-1970, 43 y.o.   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 HAVING HOT FLASHES. APPETITE WAS POOR AND NOW BETTER WITH STEROIDS. BMs: RARE, SOFT, #5. DRINKS A LOT OF WATER(1X/WEEK).   PT DENIES FEVER, CHILLS, HEMATOCHEZIA, nausea, vomiting, melena, diarrhea, CHEST PAIN, SHORTNESS OF BREATH,  abdominal pain, problems swallowing, OR  heartburn or indigestion.  Past Medical History  Diagnosis Date  . Hyperlipidemia   . Cervical cancer   . PUD (peptic ulcer disease)     NSAIDS   . Chronic headaches   . Bronchitis     hx of  . Stenosis of right carotid artery   . TIA (transient ischemic attack)   . GERD (gastroesophageal reflux disease)   . Bleeding ulcer   . 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 Total vaginal hysterectomy    . Bladder suspension    . Esophagogastroduodenoscopy  02/2010    Dr. Barney Drain. Multiple duodenal ulcers, gastric erosions (bx negative for H.Pylori)  . Multiple cyst removal      coccyx, left ring finger, pelvic area & throat/vocal cord and pilonidal cyst  . Abdominal hysterectomy    . Radiology with anesthesia  03/23/2012    Procedure: RADIOLOGY WITH ANESTHESIA;  Surgeon: Rob Hickman, MD;  Location: [facility];  Service: Radiology;  Laterality: N/A;  . Esophagogastroduodenoscopy N/A 06/19/2013    Procedure: ESOPHAGOGASTRODUODENOSCOPY (EGD);  Surgeon: Danie Binder, MD;  [facility];  Service: Endoscopy;  Laterality: N/A;  11:00-rescheduled to100 Leigh Ann to notify pt   Allergies  Allergen Reactions  . Pantoprazole Sodium Itching and Rash  . Penicillins Itching and Rash  . Sulfa Antibiotics Itching and Rash    Current Outpatient Prescriptions  Medication Sig Dispense Refill    . aspirin EC 81 MG tablet Take 81 mg by mouth daily.      . clopidogrel (PLAVIX) 75 MG tablet Take 1 tablet (75 mg total) by mouth daily.    . Estradiol (DIVIGEL) 1 MG/GM GEL Apply 1 application topically daily. Applies to right thigh every morning.    . Estradiol 1 MG/GM GEL Place 1 packet onto the skin daily.    Vanessa Kick Ethyl (VASCEPA) 1 G CAPS Take 2 capsules by mouth 2 (two) times daily.    . lansoprazole (PREVACID) 30 MG capsule 1 po 30 mins prior to meals bid    . naproxen sodium (ALEVE) 220 MG tablet Take 220 mg by mouth 2 (two) times daily as needed (Pain).    . ranitidine (ZANTAC) 300 MG tablet Take 300 mg by mouth at bedtime.  QOD    . rosuvastatin (CRESTOR) 20 MG tablet Take 20 mg by mouth daily.          Review of Systems     Objective:   Physical Exam  Vitals reviewed. Constitutional: She is oriented to person, place, and time. She appears well-developed and well-nourished. No distress.  HENT:  Head: Normocephalic and atraumatic.  Mouth/Throat: Oropharynx is clear and moist. No oropharyngeal exudate.  Eyes: Pupils are equal, round, and reactive to light. No scleral icterus.  Neck: Normal range of motion. Neck supple.  Cardiovascular: Normal rate, regular rhythm and normal heart sounds.   Pulmonary/Chest: Effort normal and breath  sounds normal. No respiratory distress.  Abdominal: Soft. Bowel sounds are normal. She exhibits no distension. There is no tenderness.  Musculoskeletal: She exhibits no edema.  Lymphadenopathy:    She has no cervical adenopathy.  Neurological: She is alert and oriented to person, place, and time.  NO FOCAL DEFICITS   Psychiatric: She has a normal mood and affect.          Assessment & Plan:

## 2013-12-08 ENCOUNTER — Encounter: Payer: Self-pay | Admitting: Obstetrics & Gynecology

## 2013-12-08 ENCOUNTER — Ambulatory Visit (INDEPENDENT_AMBULATORY_CARE_PROVIDER_SITE_OTHER): Payer: BC Managed Care – PPO | Admitting: Obstetrics & Gynecology

## 2013-12-08 VITALS — BP 100/80 | Ht 62.0 in | Wt 102.0 lb

## 2013-12-08 DIAGNOSIS — N951 Menopausal and female climacteric states: Secondary | ICD-10-CM

## 2013-12-08 MED ORDER — ESTRADIOL 2 MG PO TABS: 2.0000 mg | ORAL_TABLET | Freq: Every day | ORAL | Status: DC

## 2013-12-08 NOTE — Progress Notes (Signed)
Patient ID: [name], female   DOB: Dec 11, 1970, 43 y.o.   MRN: [id number] Pt having increased hot flashes even on divigel  Will add oral estrogen 2 mg daily  Will touch base if not improved  Being managed for pituatary and adrenal issues  Follow up prn

## 2014-05-14 ENCOUNTER — Encounter: Payer: Self-pay | Admitting: Gastroenterology

## 2014-05-30 ENCOUNTER — Telehealth: Payer: Self-pay | Admitting: Gastroenterology

## 2014-05-30 NOTE — Telephone Encounter (Signed)
Patient called stating that she is having stomach pain and thinks it is her ulcers.  Has appointment in march with slf.  Please call her

## 2014-05-31 NOTE — Telephone Encounter (Signed)
LM for a return call.  

## 2014-05-31 NOTE — Telephone Encounter (Signed)
Pt is having a lot of abdominal pain and reflux. Wanted earlier appt. Ov with Walden Field, NP on 06/07/2014 @ 9:30.

## 2014-06-07 ENCOUNTER — Encounter: Payer: Self-pay | Admitting: Nurse Practitioner

## 2014-06-07 ENCOUNTER — Encounter (INDEPENDENT_AMBULATORY_CARE_PROVIDER_SITE_OTHER): Payer: Self-pay

## 2014-06-07 ENCOUNTER — Ambulatory Visit (INDEPENDENT_AMBULATORY_CARE_PROVIDER_SITE_OTHER): Payer: BLUE CROSS/BLUE SHIELD | Admitting: Nurse Practitioner

## 2014-06-07 VITALS — BP 124/82 | HR 79 | Temp 96.6°F | Ht 62.0 in | Wt 115.6 lb

## 2014-06-07 DIAGNOSIS — R1013 Epigastric pain: Secondary | ICD-10-CM

## 2014-06-07 MED ORDER — RANITIDINE HCL 300 MG PO TABS: 300.0000 mg | ORAL_TABLET | Freq: Every day | ORAL | Status: DC

## 2014-06-07 MED ORDER — SUCRALFATE 1 GM/10ML PO SUSP: 1.0000 g | Freq: Three times a day (TID) | ORAL | Status: DC | PRN

## 2014-06-07 NOTE — Patient Instructions (Signed)
1. Start takign Zantac every day 2. I've added carafate suspension, take 1 gram up to three times a day as needed for exacerbation (temporary worsening) of symptoms 3. Follow-up in 1 month with Dr. Oneida Alar.

## 2014-06-07 NOTE — Progress Notes (Signed)
Referring Provider: Avelino Leeds* Primary Care Physician:  Mackey Birchwood Primary GI: Dr. Oneida Alar  Chief Complaint  Patient presents with  . Abdominal Pain    HPI:   44 year old female presents for follow-up on acid reflux. Last seen here 11/22/13 with Dr. Oneida Alar who noted symptoms clinically improved and likely due to gastritis/duodenitits and adrenal insufficiency. Recommended continue to monitor weight, follow-up with [facility], continue Prevacid and Zantac. Last EGD 06/19/13 with findings of nausea/dydpepsia due to uncontrolled GERD and NSAID gastritis. Started on bid Prevacid, dietary avoidance of trigger foods, low fat diet. Patietn was schedule to follow-up with Dr. Oneida Alar in March 2016 but called requesting earlier appointment due to worsening acid reflux symptoms.  Today states her symptoms have started getting worse again. Is taking prevacid twice a day and Zantac every other night. Symptoms began about 10 days ago. Has recently started Chantix for a week or two and thinks this may be contributing because it was the only thing that had changed. Since stopping the Chantix, symptoms have not improved. Symptoms include epigastric pain which occurs after every meal. No worse at night. Is worse when laying flat on her back. Denies sour/bitter taste in her mouth, excessive belching. Has a bowel movement irregularly, approximately every 2-3 days. Denies hematochezia or melena. Admits occasional nausea, no vomiting. Takes rare NSAID medication (Ibuprofen usually) with a headache, tylenol does not help much. Does take a daily ASA 81 mg. On Plavix. Denies ASA powders. Denies any other upper or lower GI symptoms. Denies fever, chills, unintentional weight loss, change in appetite, chest pain, shortness of breath, dysphagia,  Odynophagia.   Past Medical History  Diagnosis Date  . Hyperlipidemia   . Cervical cancer   . PUD (peptic ulcer disease)     NSAIDS   . Chronic headaches     . Bronchitis     hx of  . Stenosis of right carotid artery   . TIA (transient ischemic attack)   . GERD (gastroesophageal reflux disease)   . Bleeding ulcer   . Carotid artery disease     bilateral internal carotid artery occlusion by duplex ultrasound  . Pituitary tumor     Past Surgical History  Procedure Laterality Date  . Total vaginal hysterectomy    . Bladder suspension    . Esophagogastroduodenoscopy  02/2010    Dr. Barney Drain. Multiple duodenal ulcers, gastric erosions (bx negative for H.Pylori)  . Multiple cyst removal      coccyx, left ring finger, pelvic area & throat/vocal cord and pilonidal cyst  . Abdominal hysterectomy    . Radiology with anesthesia  03/23/2012    Procedure: RADIOLOGY WITH ANESTHESIA;  Surgeon: Rob Hickman, MD;  Location: [facility];  Service: Radiology;  Laterality: N/A;  . Esophagogastroduodenoscopy N/A 06/19/2013    Procedure: ESOPHAGOGASTRODUODENOSCOPY (EGD);  Surgeon: Danie Binder, MD;  [facility];  Service: Endoscopy;  Laterality: N/A;  11:00-rescheduled to100 Leigh Ann to notify pt    Current Outpatient Prescriptions  Medication Sig Dispense Refill  . aspirin EC 81 MG tablet Take 81 mg by mouth daily.    . cabergoline (DOSTINEX) 0.5 MG tablet Take 0.25 mg by mouth once a week.    . clopidogrel (PLAVIX) 75 MG tablet Take 1 tablet (75 mg total) by mouth daily. 30 tablet 0  . Estradiol (DIVIGEL) 1 MG/GM GEL Apply 1 application topically daily. Applies to right thigh every morning.    Marland Kitchen estradiol (  ESTRACE) 2 MG tablet Take 1 tablet (2 mg total) by mouth daily. 30 tablet 11  . Icosapent Ethyl (VASCEPA) 1 G CAPS Take 2 capsules by mouth 2 (two) times daily.    . lansoprazole (PREVACID) 30 MG capsule 1 po 30 mins prior to meals bid 60 capsule 11  . naproxen sodium (ALEVE) 220 MG tablet Take 220 mg by mouth 2 (two) times daily as needed (Pain).    . NON FORMULARY Takes an OTC Potassium     Not sure the strength    . predniSONE  (DELTASONE) 5 MG tablet Take 5 mg by mouth daily with breakfast.    . ranitidine (ZANTAC) 300 MG tablet Take 300 mg by mouth every other day.     . rosuvastatin (CRESTOR) 20 MG tablet Take 20 mg by mouth daily.     No current facility-administered medications for this visit.    Allergies as of 06/07/2014 - Review Complete 06/07/2014  Allergen Reaction Noted  . Pantoprazole sodium Itching and Rash 07/16/2010  . Penicillins Itching and Rash 07/16/2010  . Sulfa antibiotics Itching and Rash 07/16/2010    Family History  Problem Relation Age of Onset  . Diabetes Mother   . COPD Mother   . Heart disease Father   . Cervical cancer Sister   . Colon cancer Neg Hx   . Colon polyps Neg Hx     History   Social History  . Marital Status: Married    Spouse Name: N/A  . Number of Children: N/A  . Years of Education: N/A   Occupational History  . unemployed    Social History Main Topics  . Smoking status: Current Every Day Smoker -- 1.00 packs/day for 25 years    Types: Cigarettes    Last Attempt to Quit: 01/12/2012  . Smokeless tobacco: Former Systems developer    Quit date: 03/09/2012     Comment: smoking cigarettes   6-8 daily  . Alcohol Use: 8.4 - 16.8 oz/week    14-28 Cans of beer per week     Comment: drinks 2-4 beers a day; occassional  . Drug Use: No  . Sexual Activity:    Partners: Male    Birth Control/ Protection: Surgical     Comment: spouse   Other Topics Concern  . None   Social History Narrative    Review of Systems: All negative except as per HPI.  Physical Exam: BP 124/82 mmHg  Pulse 79  Temp(Src) 96.6 F (35.9 C) (Oral)  Ht 5\' 2"  (1.575 m)  Wt 115 lb 9.6 oz (52.436 kg)  BMI 21.14 kg/m2 General:   Alert and oriented. No distress noted. Pleasant and cooperative.  Head:  Normocephalic and atraumatic. Eyes:  Conjuctiva clear without scleral icterus. Mouth:  Oral mucosa pink and moist. Good dentition. No lesions. Lungs:  Clear to auscultation bilaterally. No  wheezes, rales, or rhonchi. No distress.  Heart:  S1, S2 present without murmurs, rubs, or gallops. Regular rate and rhythm. Abdomen:  +BS, soft, and non-distended. Mild epigastric TTP. No rebound or guarding. No HSM or masses noted. Msk:  Symmetrical without gross deformities. Normal posture. Extremities:  Without edema. Neurologic:  Alert and  oriented x4;  grossly normal neurologically. Skin:  Intact without significant lesions or rashes. Psych:  Alert and cooperative. Normal mood and affect.    06/07/2014 10:08 AM

## 2014-06-07 NOTE — Assessment & Plan Note (Signed)
Symptoms worsening again. On Prevacid 30mg  bid and Zantac 300mg  every other day. No red flag/warning signs like overt GI bleed, hematemesis, or weight loss. Will change Zantac to 300 mg daily and add carafate liquid prn with symptomatic control. Return for follow-up in one month to reassess symptoms and recommend further work-up or treatment.

## 2014-06-12 NOTE — Progress Notes (Signed)
CC'ED TO PCP 

## 2014-06-20 ENCOUNTER — Ambulatory Visit: Payer: Self-pay | Admitting: Gastroenterology

## 2014-06-27 ENCOUNTER — Other Ambulatory Visit: Payer: Self-pay | Admitting: Gastroenterology

## 2014-07-04 ENCOUNTER — Ambulatory Visit (INDEPENDENT_AMBULATORY_CARE_PROVIDER_SITE_OTHER): Payer: BLUE CROSS/BLUE SHIELD | Admitting: Gastroenterology

## 2014-07-04 ENCOUNTER — Encounter: Payer: Self-pay | Admitting: Gastroenterology

## 2014-07-04 VITALS — BP 121/79 | HR 76 | Temp 98.0°F | Ht 62.0 in | Wt 116.8 lb

## 2014-07-04 DIAGNOSIS — R1013 Epigastric pain: Secondary | ICD-10-CM

## 2014-07-04 NOTE — Progress Notes (Signed)
ON RECALL LIST  °

## 2014-07-04 NOTE — Assessment & Plan Note (Addendum)
ASSOCIATED WITH EPIGASTRIC PAIN AND BLOATING. SYMPTOMS NOT IDEALLY CONTROLLED. MAY BE DUE TO WATER(AQUAFINA) INTAKE. CONSIDER SMALL BOWEL BACTRERIAL OVERGROWTH.  ENCOURAGED PT TO CUT WATER INTAKE IN HALF, ESPECIALLY AT NIGHTTIME. CONSIDER HYDROGEN BREATH TEST TO EVALUATE FOR FOR SIBO. DISCUSSED PROCEDURE, BENEFITS, AND RISKS OF PROCEDURE. CONTINUE PREVACID. CHANGE ZANTAC TO QoHS USE CARAFATE AFTER EATING IF SHE HAS BLOATING. FOLLOW UP IN 6 MOS.

## 2014-07-04 NOTE — Patient Instructions (Signed)
TRY TO CUT WATER INTAKE IN HALF, ESPECIALLY AT NIGHTTIME.  CALL IF YOU WOULD LIKE TO CONSIDER AN HYDROGEN BREATH TEST TO EVALUATE FOR THE WRONG BACTERIA MIX IN YOUR SMALL BOWEL.  CONTINUE PREVACID. TAKE 30 MINUTES PRIOR TO BREAKFAST and supper.  CHANGE ZANTAC EVERY OTHER NIGHT.  USE CARAFATE AFTER EATING IF YOU FEEL BLOATED.  FOLLOW UP IN 6 MOS.

## 2014-07-04 NOTE — Progress Notes (Signed)
CC'ED TO PCP 

## 2014-07-04 NOTE — Progress Notes (Signed)
Subjective:    Patient ID: [name], female    DOB: 1970/06/07, 44 y.o.   MRN: [id number]  ROBERTSON, ANTHONY T, PA-C  HPI TRIED TO QUIT SMOKING AND TOOK CHANTIX. THOUGHT IT WAS MAKING HER STOMACH HURT. ACHES AFTER SHE EATS. STILL SMOKING. ETOH: 2X/WEEK(BEER-COORS LIGHT). TAKING DAYQUIL FOR A COUPLE OF DAYS. HAD VIRAL ILLNESS. BMs: WATERY(DRINKS A LOT OF WATER#5-7). BMS: EVERY OTHER DAY. HAS NASAL CONGESTION. HAS EPIGASTRIC PAIN(DULL ACHE, ALL THE TIME, WORSE WHEN SHE GETS FULL.Marland KitchenGOES THROUGH 6 BOTTLES OF WATER THROUGHOUT THE NIGHT AND DURING DAY AT LEAS 4-5. DRINK WATER BECAUSE SHE STAYS THIRSTY ALL THE TIME. Marland Kitchen ZANTAC QHS. NOT TAKING CARAFATE.  PT DENIES FEVER, CHILLS, HEMATOCHEZIA, HEMATEMESIS, nausea, vomiting, melena,  CHEST PAIN, SHORTNESS OF BREATH,  CHANGE IN BOWEL IN HABITS, constipation, abdominal pain, probleswallowing, or heartburn or indigestion.  Past Medical History  Diagnosis Date  . Hyperlipidemia   . Cervical cancer   . PUD (peptic ulcer disease)     NSAIDS   . Chronic headaches   . Bronchitis     hx of  . Stenosis of right carotid artery   . TIA (transient ischemic attack)   . GERD (gastroesophageal reflux disease)   . Bleeding ulcer   . Carotid artery disease     bilateral internal carotid artery occlusion by duplex ultrasound  . Pituitary tumor    Past Surgical History  Procedure Laterality Date  . Total vaginal hysterectomy    . Bladder suspension    . Esophagogastroduodenoscopy  02/2010    Dr. Barney Drain. Multiple duodenal ulcers, gastric erosions (bx negative for H.Pylori)  . Multiple cyst removal      coccyx, left ring finger, pelvic area & throat/vocal cord and pilonidal cyst  . Abdominal hysterectomy    . Radiology with anesthesia  03/23/2012    Procedure: RADIOLOGY WITH ANESTHESIA;  Surgeon: Rob Hickman, MD;  Location: [place];  Service: Radiology;  Laterality: N/A;  . Esophagogastroduodenoscopy N/A 06/19/2013    SLF: 1. Earlene Plater /dyspepsia  due to uncontrolled GERD, & NSAID gastritis/duodenitis.    Allergies  Allergen Reactions  . Pantoprazole Sodium Itching and Rash  . Penicillins Itching and Rash  . Sulfa Antibiotics Itching and Rash    Current Outpatient Prescriptions  Medication Sig Dispense Refill  . aspirin EC 81 MG tablet Take 81 mg by mouth daily.    . cabergoline (DOSTINEX) 0.5 MG tablet Take 0.25 mg by mouth once a week.    . clopidogrel (PLAVIX) 75 MG tablet Take 1 tablet (75 mg total) by mouth daily.    . Estradiol (DIVIGEL) 1 MG/GM GEL Apply 1 application topically daily. Applies to right thigh every morning.    Marland Kitchen estradiol (ESTRACE) 2 MG tablet Take 1 tablet (2 mg total) by mouth daily.    Vanessa Kick Ethyl (VASCEPA) 1 G CAPS Take 2 capsules by mouth 2 (two) times daily.    . lansoprazole (PREVACID) 30 MG capsule TAKE  (1)  CAPSULE  TWICE DAILY     . naproxen sodium (ALEVE) 220 MG tablet Take 220 mg by mouth 2 (two) times daily as needed (Pain). NOT EVERY DAY   .      Marland Kitchen predniSONE (DELTASONE) 5 MG tablet Take 5 mg by mouth daily with breakfast.    . ranitidine (ZANTAC) 300 MG tablet Take 1 tablet (300 mg total) by mouth at bedtime.    . rosuvastatin (CRESTOR) 20 MG tablet Take 20 mg by mouth daily.    Marland Kitchen  Review of Systems     Objective:   Physical Exam  Constitutional: She is oriented to person, place, and time. She appears well-developed and well-nourished. No distress.  HENT:  Head: Normocephalic and atraumatic.  Mouth/Throat: Oropharynx is clear and moist. No oropharyngeal exudate.  Eyes: Pupils are equal, round, and reactive to light. No scleral icterus.  Neck: Normal range of motion. Neck supple.  Cardiovascular: Normal rate, regular rhythm and normal heart sounds.   Pulmonary/Chest: Effort normal and breath sounds normal. No respiratory distress.  Abdominal: Soft. Bowel sounds are normal. She exhibits no distension. There is no tenderness.  Musculoskeletal: She exhibits no edema.    Lymphadenopathy:    She has no cervical adenopathy.  Neurological: She is alert and oriented to person, place, and time.  NO FOCAL DEFICITS   Psychiatric: She has a normal mood and affect.  Vitals reviewed.         Assessment & Plan:

## 2014-07-04 NOTE — Progress Notes (Signed)
REVIEWED-NO ADDITIONAL RECOMMENDATIONS. 

## 2014-07-16 ENCOUNTER — Other Ambulatory Visit: Payer: Self-pay

## 2014-08-28 ENCOUNTER — Other Ambulatory Visit: Payer: Self-pay | Admitting: Obstetrics & Gynecology

## 2014-11-06 ENCOUNTER — Other Ambulatory Visit: Payer: Self-pay | Admitting: Nurse Practitioner

## 2014-11-13 ENCOUNTER — Encounter: Payer: Self-pay | Admitting: Gastroenterology

## 2014-12-08 ENCOUNTER — Other Ambulatory Visit: Payer: Self-pay | Admitting: Obstetrics & Gynecology

## 2014-12-20 ENCOUNTER — Ambulatory Visit: Payer: BLUE CROSS/BLUE SHIELD | Admitting: Gastroenterology

## 2014-12-28 ENCOUNTER — Other Ambulatory Visit: Payer: Self-pay | Admitting: Gastroenterology

## 2015-06-18 ENCOUNTER — Other Ambulatory Visit: Payer: Self-pay | Admitting: Nurse Practitioner

## 2015-08-02 ENCOUNTER — Other Ambulatory Visit: Payer: Self-pay | Admitting: Obstetrics & Gynecology

## 2015-09-06 ENCOUNTER — Other Ambulatory Visit: Payer: Self-pay | Admitting: Gastroenterology

## 2015-12-19 ENCOUNTER — Other Ambulatory Visit: Payer: Self-pay | Admitting: Obstetrics & Gynecology

## 2016-02-05 ENCOUNTER — Encounter (HOSPITAL_COMMUNITY): Payer: Self-pay | Admitting: Hematology

## 2016-02-05 ENCOUNTER — Encounter (HOSPITAL_COMMUNITY): Payer: BLUE CROSS/BLUE SHIELD | Attending: Hematology | Admitting: Hematology

## 2016-02-05 ENCOUNTER — Encounter (HOSPITAL_COMMUNITY): Payer: BLUE CROSS/BLUE SHIELD

## 2016-02-05 VITALS — BP 138/81 | HR 74 | Temp 98.3°F | Resp 16 | Ht 62.0 in | Wt 108.0 lb

## 2016-02-05 DIAGNOSIS — D7589 Other specified diseases of blood and blood-forming organs: Secondary | ICD-10-CM

## 2016-02-05 DIAGNOSIS — E2740 Unspecified adrenocortical insufficiency: Secondary | ICD-10-CM

## 2016-02-05 DIAGNOSIS — D72829 Elevated white blood cell count, unspecified: Secondary | ICD-10-CM | POA: Diagnosis not present

## 2016-02-05 DIAGNOSIS — D352 Benign neoplasm of pituitary gland: Secondary | ICD-10-CM

## 2016-02-05 DIAGNOSIS — D72828 Other elevated white blood cell count: Secondary | ICD-10-CM

## 2016-02-05 DIAGNOSIS — D72820 Lymphocytosis (symptomatic): Secondary | ICD-10-CM

## 2016-02-05 DIAGNOSIS — R6889 Other general symptoms and signs: Secondary | ICD-10-CM

## 2016-02-05 LAB — COMPREHENSIVE METABOLIC PANEL
ALT: 21 U/L (ref 14–54)
AST: 35 U/L (ref 15–41)
Albumin: 4.2 g/dL (ref 3.5–5.0)
Alkaline Phosphatase: 69 U/L (ref 38–126)
Anion gap: 10 (ref 5–15)
BILIRUBIN TOTAL: 0.6 mg/dL (ref 0.3–1.2)
BUN: 9 mg/dL (ref 6–20)
CO2: 27 mmol/L (ref 22–32)
Calcium: 9.1 mg/dL (ref 8.9–10.3)
Chloride: 99 mmol/L — ABNORMAL LOW (ref 101–111)
Creatinine, Ser: 0.68 mg/dL (ref 0.44–1.00)
GFR calc Af Amer: >60 mL/min (ref >60)
Glucose, Bld: 98 mg/dL (ref 65–99)
POTASSIUM: 3.9 mmol/L (ref 3.5–5.1)
Sodium: 136 mmol/L (ref 135–145)
TOTAL PROTEIN: 7.8 g/dL (ref 6.5–8.1)

## 2016-02-05 LAB — CBC WITH DIFFERENTIAL/PLATELET
BASOS PCT: 1 %
Basophils Absolute: 0.1 10*3/uL (ref 0.0–0.1)
Eosinophils Absolute: 0 10*3/uL (ref 0.0–0.7)
Eosinophils Relative: 0 %
HEMATOCRIT: 40 % (ref 36.0–46.0)
HEMOGLOBIN: 13.6 g/dL (ref 12.0–15.0)
LYMPHS ABS: 2.7 10*3/uL (ref 0.7–4.0)
Lymphocytes Relative: 17 %
MCH: 33.3 pg (ref 26.0–34.0)
MCHC: 34 g/dL (ref 30.0–36.0)
MCV: 98 fL (ref 78.0–100.0)
MONO ABS: 0.7 10*3/uL (ref 0.1–1.0)
MONOS PCT: 5 %
NEUTROS ABS: 12.1 10*3/uL — AB (ref 1.7–7.7)
Neutrophils Relative %: 77 %
Platelets: 364 10*3/uL (ref 150–400)
RBC: 4.08 MIL/uL (ref 3.87–5.11)
RDW: 13.1 % (ref 11.5–15.5)
WBC: 15.6 10*3/uL — ABNORMAL HIGH (ref 4.0–10.5)

## 2016-02-05 LAB — TSH: TSH: 1.684 u[IU]/mL (ref 0.350–4.500)

## 2016-02-05 LAB — RETICULOCYTES
RBC.: 4.08 MIL/uL (ref 3.87–5.11)
Retic Count, Absolute: 102 10*3/uL (ref 19.0–186.0)
Retic Ct Pct: 2.5 % (ref 0.4–3.1)

## 2016-02-05 LAB — LACTATE DEHYDROGENASE: LDH: 202 U/L — AB (ref 98–192)

## 2016-02-05 LAB — SEDIMENTATION RATE: Sed Rate: 10 mm/hr (ref 0–22)

## 2016-02-05 LAB — T4, FREE: FREE T4: 0.49 ng/dL — AB (ref 0.61–1.12)

## 2016-02-05 LAB — VITAMIN B12: VITAMIN B 12: 476 pg/mL (ref 180–914)

## 2016-02-05 NOTE — Progress Notes (Signed)
HEMATOLOGY/ONCOLOGY CONSULTATION NOTE  Date of Service: 02/05/2016  Patient Care Team: Bronson Curb, PA-C as PCP - General (Physician Assistant) Danie Binder, MD (Gastroenterology) Referring physician: Barry Dienes MD  CHIEF COMPLAINTS/PURPOSE OF CONSULTATION:  leukocytosis  HISTORY OF PRESENTING ILLNESS:  [name] is a wonderful 45 y.o. female who has been referred to [place] by Dr Barry Dienes MD for evaluation and management of leucocytosis.  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  Patient had labs with her primary care physician on 12/25/2015 that showed a WBC count of 16.1k with neutrophilia of 10.9k normal hemoglobin of 13.8 and normal platelets of 331k. She has had chronic leukocytosis based on labs from May 2017 and 02/19/2012 in our system.  No fevers or chills no night sweats has had times had some lymphocytosis as well as high as 6.9k in May 2017.  Does not report any recent overt infections. No other acute new focal symptoms.  She had previously seen Dr Tressie Stalker for thrombocytosis and leukocytosis in 2012   at which time her  Jak2 testing was apparently negative and BCR -ABL testing was neg.  MEDICAL HISTORY:  Past Medical History:  Diagnosis Date  . Bleeding ulcer   . Bronchitis    hx of  . Carotid artery disease ([place])    bilateral internal carotid artery occlusion by duplex ultrasound  . Cervical cancer ([place])   . Chronic headaches   . GERD (gastroesophageal reflux disease)   . Hyperlipidemia   .  Pituitary tumor   . PUD (peptic ulcer disease)    NSAIDS   . Stenosis of right carotid artery   . TIA (transient ischemic attack)     SURGICAL HISTORY: Past Surgical History:  Procedure Laterality Date  . ABDOMINAL HYSTERECTOMY    . BLADDER SUSPENSION    . ESOPHAGOGASTRODUODENOSCOPY  02/2010   Dr. Barney Drain. Multiple duodenal ulcers, gastric erosions (bx negative for H.Pylori)  . ESOPHAGOGASTRODUODENOSCOPY N/A 06/19/2013   SLF: 1. Earlene Plater /dyspepsia due to uncontrolled GERD, & NSAID gastritis/duodenitis.  . Multiple cyst removal     coccyx, left ring finger, pelvic area & throat/vocal cord and pilonidal cyst  . RADIOLOGY WITH ANESTHESIA  03/23/2012   Procedure: RADIOLOGY WITH ANESTHESIA;  Surgeon: Rob Hickman, MD;  Location: [place];  Service: Radiology;  Laterality: N/A;  . TOTAL VAGINAL HYSTERECTOMY      SOCIAL HISTORY: Social History   Social History  . Marital status: Married    Spouse name: N/A  . Number of children: N/A  . Years of education: N/A   Occupational History  . unemployed Unemployed   Social History Main Topics  . Smoking status: Current Every Day Smoker    Packs/day: 0.50    Years: 25.00    Types: Cigarettes    Last attempt to quit: 01/12/2012  . Smokeless tobacco: Former Systems developer    Quit date: 03/09/2012     Comment: smoking cigarettes   6-8 daily  . Alcohol use 8.4 - 16.8 oz/week    14 - 28 Cans of beer per week     Comment: drinks 2-4 beers a day; occassional  . Drug  use: No  . Sexual activity: Yes    Partners: Male    Birth control/ protection: Surgical     Comment: spouse   Other Topics Concern  . Not on file   Social History Narrative  . No narrative on file    FAMILY HISTORY: Family History  Problem Relation Age of Onset  . Diabetes Mother   . COPD Mother   . Heart disease Father   . Cervical cancer Sister   . Colon cancer Neg Hx   . Colon polyps Neg Hx     ALLERGIES:  is allergic to pantoprazole sodium; penicillins; and  sulfa antibiotics.  MEDICATIONS:  Current Outpatient Prescriptions  Medication Sig Dispense Refill  . aspirin EC 81 MG tablet Take 81 mg by mouth daily.    . cabergoline (DOSTINEX) 0.5 MG tablet Take 0.25 mg by mouth once a week.    . clopidogrel (PLAVIX) 75 MG tablet Take 1 tablet (75 mg total) by mouth daily. 30 tablet 0  . estradiol (ESTRACE) 2 MG tablet TAKE 1 TABLET BY MOUTH ONCE DAILY. 30 tablet 11  . Icosapent Ethyl (VASCEPA) 1 G CAPS Take 2 capsules by mouth 2 (two) times daily.    . lansoprazole (PREVACID) 30 MG capsule Take 1 capsule twice daily (on an empty stomach at least 30 minutes before meal). 60 capsule 11  . NON FORMULARY Takes an OTC Potassium     Not sure the strength    . predniSONE (DELTASONE) 5 MG tablet Take 5 mg by mouth daily with breakfast.    . ranitidine (ZANTAC) 300 MG tablet TAKE ONE TABLET BY MOUTH AT BEDTIME. 30 tablet 5  . rosuvastatin (CRESTOR) 20 MG tablet Take 20 mg by mouth daily.     No current facility-administered medications for this visit.     REVIEW OF SYSTEMS:    10 Point review of Systems was done is negative except as noted above.  PHYSICAL EXAMINATION: ECOG PERFORMANCE STATUS: 1 - Symptomatic but completely ambulatory  . Vitals:   02/05/16 1408  BP: 138/81  Pulse: 74  Resp: 16  Temp: 98.3 F (36.8 C)   Filed Weights   02/05/16 1408  Weight: 108 lb (49 kg)   .Body mass index is 19.75 kg/m.  GENERAL:alert, in no acute distress and comfortable SKIN: skin color, texture, turgor are normal, no rashes or significant lesions EYES: normal, conjunctiva are pink and non-injected, sclera clear OROPHARYNX:no exudate, no erythema and lips, buccal mucosa, and tongue normal  NECK: supple, no JVD, thyroid normal size, non-tender, without nodularity LYMPH:  no palpable lymphadenopathy in the cervical, axillary or inguinal LUNGS: clear to auscultation with normal respiratory effort HEART: regular rate & rhythm,  no murmurs and no lower  extremity edema ABDOMEN: abdomen soft, non-tender, normoactive bowel sounds , no palpable hepatosplenomegaly  Musculoskeletal: no cyanosis of digits and no clubbing  PSYCH: alert & oriented x 3 with fluent speech NEURO: no focal motor/sensory deficits  LABORATORY DATA:  I have reviewed the data as listed  . CBC Latest Ref Rng & Units 03/22/2012 03/02/2012 01/19/2011  WBC 4.0 - 10.5 K/uL 8.9 13.4(H) 10.3  Hemoglobin 12.0 - 15.0 g/dL 12.0 14.2 13.8  Hematocrit 36.0 - 46.0 % 36.0 40.7 42.5  Platelets 150 - 400 K/uL 289 294 433(H)    . CMP Latest Ref Rng & Units 09/12/2012 03/22/2012 03/02/2012  Glucose 70 - 99 mg/dL 100(H) 90 82  BUN 6 - 23 mg/dL 4(L) 8 4(L)  Creatinine 0.50 -  1.10 mg/dL 0.72 0.63 0.57  Sodium 135 - 145 mEq/L 133(L) 137 137  Potassium 3.5 - 5.1 mEq/L 3.5 3.9 4.2  Chloride 96 - 112 mEq/L 94(L) 100 99  CO2 19 - 32 mEq/L 24 25 27   Calcium 8.4 - 10.5 mg/dL 9.8 9.6 9.5  Total Protein 6.0 - 8.3 g/dL - 7.5 -  Total Bilirubin 0.3 - 1.2 mg/dL - 0.2(L) -  Alkaline Phos 39 - 117 U/L - 74 -  AST 0 - 37 U/L - 13 -  ALT 0 - 35 U/L - 7 -   Component     Latest Ref Rng & Units 02/05/2016  Retic Ct Pct     0.4 - 3.1 % 2.5  RBC.     3.87 - 5.11 MIL/uL 4.08  Retic Count, Manual     19.0 - 186.0 K/uL 102.0  Folate, Hemolysate     Not Estab. ng/mL >620.0  HCT     34.0 - 46.6 % 38.9  Folate, RBC     >498 ng/mL >1,594  LDH     98 - 192 U/L 202 (H)  Sed Rate     0 - 22 mm/hr 10  TSH     0.350 - 4.500 uIU/mL 1.684  T4,Free(Direct)     0.61 - 1.12 ng/dL 0.49 (L)  Vitamin B12     180 - 914 pg/mL 476      Bcr/abl gene rearrangement qnt, PCR  Order: [id number]  Status:  Final result Visible to patient:  No (Not Released) Next appt:  03/17/2016 at 02:00 PM in Oncology Molli Hazard, MD) Dx:  Thrombocytosis ([place])   Ref Range & Units 14yrago  BCR/ABL Source  .   BCR ABL1 / ABL1 not reported 0.000   BCR ABL1 / ABL1 IS not reported 0.000   Interpretation - BCRQ   SEE BELOW   Comments: (NOTE) The P190 and P210 BCR-ABL1 fusion transcripts are NOT detected. Reverse transcription real-time PCR is performed for the P190 and P210 BCR-ABL1 transcripts associated with the t(9;22) chromosomal translocation. For P190, results are expressed as a percent ratio of BCRABL1 to the ABL1 transcript, and further adjusted to the international scale (IS) for P210. Assay sensitivity is dependent on RNA quality and sample cellularity but is usually at least 4-logs below baseline BCR-ABL1 transcript levels. BCR-ABL1 reporting has changed as of August 2012. Although not precisely equivalent, a BCR-ABL1/ABL1 value of 10% (IS) correlates with a 1-log reduction in the old assay; 1% with a 2-log reduction; and 0.1% with a 3-log reduction. This test was developed and its performance characteristics have been determined by [employer] Performance characteristics refer to the analytical performance of the test. P190 BCR-ABL1:   NOT DETECTED P210 BCR-ABL1:   NOT DETECTED        RADIOGRAPHIC STUDIES: I have personally reviewed the radiological images as listed and agreed with the findings in the report. No results found.  ASSESSMENT & PLAN:   45year old Caucasian female with  #1 leukocytosis - chronic since 2012 .  Predominantly neutrophilia but also intermittently has waxing and waning lymphocytosis. Flow cytometry did not show any clonal lymphocyte population. Her neutrophilia is likely reactive due to her smoking and chronic steroid use for adrenal insufficiency. Previously jak 2 testing was negative and BCR ABL testing was negative in 2012. Plan -Her counts are unlikely to represent a clonal lymphoproliferative or myeloproliferative process at this time. -She was counseled strongly on smoking cessation since  she has had 2 CVAs in the past and has multiple vascular risk factors . -Would need to weigh the risks  and benefits of chronic estrogen replacement therapy with her primary care physician . -No indication for bone marrow biopsy at this time . Follow-up with Dr. Whitney Muse in 1 month with repeat CBC and differential   #2 pituitary adenoma/prolactinoma with adrenal insufficiency Plan -Patient follows with [place] endocrinology  -On chronic prednisone replacement . 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 We shall forward our note to her endocrinologist and informed her about the results to discuss with her endocrinologist about possibly needing thyroid hormone replacement .  All of the patients questions were answered with apparent satisfaction. The patient knows to call the clinic with any problems, questions or concerns.  I spent 50 minutes counseling the patient face to face. The total time spent in the appointment was 60 minutes and more than 50% was on counseling and direct patient cares.    Sullivan Lone MD [place] AAHIVMS [facility] Hematology/Oncology Physician [facility]  (Office):       [phone] (Work cell):  [phone] (Fax):           [phone]  02/05/2016 3:00 PM

## 2016-02-05 NOTE — Patient Instructions (Signed)
[place] at [facility]  Discharge Instructions:  Labs today Follow up in 1 month.   _______________________________________________________________  Thank you for choosing [place] at [facility] to provide your oncology and hematology care.  To afford each patient quality time with our providers, please arrive at least 15 minutes before your scheduled appointment.  You need to re-schedule your appointment if you arrive 10 or more minutes late.  We strive to give you quality time with our providers, and arriving late affects you and other patients whose appointments are after yours.  Also, if you no show three or more times for appointments you may be dismissed from the clinic.  Again, thank you for choosing [place] at [place] hope is that these requests will allow you access to exceptional care and in a timely manner. _______________________________________________________________  If you have questions after your visit, please contact our office at [phone] between the hours of 8:30 a.m. and 5:00 p.m. Voicemails left after 4:30 p.m. will not be returned until the following business day. _______________________________________________________________  For prescription refill requests, have your pharmacy contact our office. _______________________________________________________________  Recommendations made by the consultant and any test results will be sent to your referring physician. _______________________________________________________________

## 2016-02-06 LAB — FOLATE RBC
Folate, Hemolysate: >620 ng/mL
HEMATOCRIT: 38.9 % (ref 34.0–46.6)

## 2016-02-13 ENCOUNTER — Telehealth (HOSPITAL_COMMUNITY): Payer: Self-pay | Admitting: *Deleted

## 2016-02-13 NOTE — Telephone Encounter (Signed)
-----   Message from Brunetta Genera, MD sent at 02/13/2016  1:11 AM EDT ----- Regarding: low free t4 Could you plz let patient know that her labs suggest her leukocytosis is likely reactive and does not represent a clonal process. Also her free T4 was noted to be low with a normal TSH suggesting central hypothyroidism. She needs to discuss the need for possible thyroid hormone replacement with her primary care physician and her [place] endocrinologist.  Thanks, Sullivan Lone MD MS

## 2016-02-13 NOTE — Telephone Encounter (Signed)
Pt states that she has an appointment with her PCP on the 7th. I advised the pt that I would fax over the notes from her visit and her labs to her PCP.

## 2016-02-19 ENCOUNTER — Other Ambulatory Visit (HOSPITAL_COMMUNITY): Payer: Self-pay | Admitting: Nurse Practitioner

## 2016-02-19 DIAGNOSIS — Z1231 Encounter for screening mammogram for malignant neoplasm of breast: Secondary | ICD-10-CM

## 2016-03-11 ENCOUNTER — Ambulatory Visit (HOSPITAL_COMMUNITY)
Admission: RE | Admit: 2016-03-11 | Discharge: 2016-03-11 | Disposition: A | Discharge status: Home / Self Care | Payer: BLUE CROSS/BLUE SHIELD | Source: Ambulatory Visit | Attending: Nurse Practitioner | Admitting: Nurse Practitioner

## 2016-03-11 DIAGNOSIS — Z1231 Encounter for screening mammogram for malignant neoplasm of breast: Secondary | ICD-10-CM | POA: Insufficient documentation

## 2016-03-17 ENCOUNTER — Encounter (HOSPITAL_COMMUNITY): Payer: Self-pay | Admitting: Hematology & Oncology

## 2016-03-17 ENCOUNTER — Encounter (HOSPITAL_COMMUNITY): Payer: BLUE CROSS/BLUE SHIELD | Attending: Hematology | Admitting: Hematology & Oncology

## 2016-03-17 ENCOUNTER — Encounter (HOSPITAL_COMMUNITY): Payer: BLUE CROSS/BLUE SHIELD

## 2016-03-17 VITALS — BP 156/69 | HR 68 | Temp 98.2°F | Resp 18 | Ht 62.0 in | Wt 109.0 lb

## 2016-03-17 DIAGNOSIS — D72828 Other elevated white blood cell count: Secondary | ICD-10-CM | POA: Diagnosis not present

## 2016-03-17 DIAGNOSIS — D72829 Elevated white blood cell count, unspecified: Secondary | ICD-10-CM

## 2016-03-17 DIAGNOSIS — D352 Benign neoplasm of pituitary gland: Secondary | ICD-10-CM | POA: Diagnosis not present

## 2016-03-17 DIAGNOSIS — D72820 Lymphocytosis (symptomatic): Secondary | ICD-10-CM

## 2016-03-17 DIAGNOSIS — E2740 Unspecified adrenocortical insufficiency: Secondary | ICD-10-CM

## 2016-03-17 LAB — CBC WITH DIFFERENTIAL/PLATELET
BASOS PCT: 1 %
Basophils Absolute: 0.1 10*3/uL (ref 0.0–0.1)
EOS ABS: 0.1 10*3/uL (ref 0.0–0.7)
EOS PCT: 1 %
HCT: 39.9 % (ref 36.0–46.0)
Hemoglobin: 13.4 g/dL (ref 12.0–15.0)
LYMPHS ABS: 2.5 10*3/uL (ref 0.7–4.0)
Lymphocytes Relative: 17 %
MCH: 33.6 pg (ref 26.0–34.0)
MCHC: 33.6 g/dL (ref 30.0–36.0)
MCV: 100 fL (ref 78.0–100.0)
MONO ABS: 0.9 10*3/uL (ref 0.1–1.0)
MONOS PCT: 6 %
NEUTROS PCT: 75 %
Neutro Abs: 11.6 10*3/uL — ABNORMAL HIGH (ref 1.7–7.7)
PLATELETS: 294 10*3/uL (ref 150–400)
RBC: 3.99 MIL/uL (ref 3.87–5.11)
RDW: 13 % (ref 11.5–15.5)
WBC: 15.2 10*3/uL — ABNORMAL HIGH (ref 4.0–10.5)

## 2016-03-17 NOTE — Patient Instructions (Addendum)
[place] at [facility] Discharge Instructions  RECOMMENDATIONS MADE BY THE CONSULTANT AND ANY TEST RESULTS WILL BE SENT TO YOUR REFERRING PHYSICIAN.  You saw Dr.Penland today. Labs today. Follow up in 6 months with labs. See Amy at checkout for appointments.   Thank you for choosing [place] at [facility] to provide your oncology and hematology care.  To afford each patient quality time with our provider, please arrive at least 15 minutes before your scheduled appointment time.   Beginning January 23rd 2017 lab work for the [facility] will be done in the  Main lab at [facility] on 1st floor. If you have a lab appointment with the [place] please come in thru the  Main Entrance and check in at the main information desk  You need to re-schedule your appointment should you arrive 10 or more minutes late.  We strive to give you quality time with our providers, and arriving late affects you and other patients whose appointments are after yours.  Also, if you no show three or more times for appointments you may be dismissed from the clinic at the providers discretion.     Again, thank you for choosing [facility].  Our hope is that these requests will decrease the amount of time that you wait before being seen by our physicians.       _____________________________________________________________  Should you have questions after your visit to [facility], please contact our office at [phone] between the hours of 8:30 a.m. and 4:30 p.m.  Voicemails left after 4:30 p.m. will not be returned until the following business day.  For prescription refill requests, have your pharmacy contact our office.         Resources For Cancer Patients and their Caregivers ? American Cancer Society: Can assist with transportation, wigs, general needs, runs Look Good Feel Better.        743-091-0468 ? Cancer  Care: Provides financial assistance, online support groups, medication/co-pay assistance.  1-800-813-HOPE 763-282-1240) ? [place] Assists [place] cancer patients and their families through emotional , educational and financial support.  775-424-9653 ? [place] DSS Where to apply for food stamps, Medicaid and utility assistance. 575-581-8476 ? [facility]: Transportation to medical appointments. (602)270-7850 ? Social Security Administration: May apply for disability if have a Stage IV cancer. 579-776-9322 332-396-1739 ? [employer], Disability and Transit Services: Assists with nutrition, care and transit needs. [place] Support Programs: @10RELATIVEDAYS @ > Cancer Support Group  2nd Tuesday of the month 1pm-2pm, Journey Room  > Creative Journey  3rd Tuesday of the month 1130am-1pm, Journey Room  > Look Good Feel Better  1st Wednesday of the month 10am-12 noon, Journey Room (Call [place] to register [phone])

## 2016-03-17 NOTE — Progress Notes (Signed)
HEMATOLOGY/ONCOLOGY CONSULTATION NOTE  Date of Service: 03/17/2016  Patient Care Team: Anthony T Robertson, PA-C as PCP - General (Physician Assistant) Sandi L Fields, MD (Gastroenterology) Referring physician: Feng Zheng MD  CHIEF COMPLAINTS/PURPOSE OF CONSULTATION:  leukocytosis  HISTORY OF PRESENTING ILLNESS:  [name] is a wonderful 45 y.o. female who is here for evaluation and management of leucocytosis. Referred by Dr Feng Zheng MD to Dr. Kale here at [facility]. Does not report any recent overt infections. No other acute new focal symptoms. She had previously seen Dr Neijstrom for thrombocytosis and leukocytosis in 2012   at which time her  Jak2 testing was apparently negative and BCR -ABL testing was neg.  Labs from her initial visit showed normal flow cytometry and neutrophilia.   She has cut back on smoking. Her dentist conducts oral exams.   PCP is Dr. Zheng.  Patient reports poor appetite, but this is a chronic symptom. Denies abdominal pain.  She had a mammogram this year. Has not had a flu shot, but her PCP has already discussed this with her.  MEDICAL HISTORY:  Past Medical History:  Diagnosis Date  . Bleeding ulcer   . Bronchitis    hx of  . Carotid artery disease (HCC)    bilateral internal carotid artery occlusion by duplex ultrasound  . Cervical cancer (HCC)   . Chronic headaches   . GERD (gastroesophageal reflux disease)   . Hyperlipidemia   . Pituitary tumor   . PUD (peptic ulcer disease)    NSAIDS   . Stenosis of right carotid artery   . TIA (transient ischemic attack)     SURGICAL HISTORY: Past Surgical History:  Procedure Laterality Date  . ABDOMINAL HYSTERECTOMY    . BLADDER SUSPENSION    . ESOPHAGOGASTRODUODENOSCOPY  02/2010   Dr. Sandi Fields. Multiple duodenal ulcers, gastric erosions (bx negative for H.Pylori)  . ESOPHAGOGASTRODUODENOSCOPY N/A 06/19/2013   SLF: 1. Nause /dyspepsia due to uncontrolled GERD, & NSAID gastritis/duodenitis.  .  Multiple cyst removal     coccyx, left ring finger, pelvic area & throat/vocal cord and pilonidal cyst  . RADIOLOGY WITH ANESTHESIA  03/23/2012   Procedure: RADIOLOGY WITH ANESTHESIA;  Surgeon: Sanjeev K Deveshwar, MD;  Location: [facility];  Service: Radiology;  Laterality: N/A;  . TOTAL VAGINAL HYSTERECTOMY      SOCIAL HISTORY: Social History   Social History  . Marital status: Married    Spouse name: N/A  . Number of children: N/A  . Years of education: N/A   Occupational History  . unemployed Unemployed   Social History Main Topics  . Smoking status: Current Every Day Smoker    Packs/day: 0.50    Years: 25.00    Types: Cigarettes    Last attempt to quit: 01/12/2012  . Smokeless tobacco: Former User    Quit date: 03/09/2012     Comment: smoking cigarettes   6-8 daily  . Alcohol use 8.4 - 16.8 oz/week    14 - 28 Cans of beer per week     Comment: drinks 2-4 beers a day; occassional  . Drug use: No  . Sexual activity: Yes    Partners: Male    Birth control/ protection: Surgical     Comment: spouse   Other Topics Concern  . Not on file   Social History Narrative  . No narrative on file    FAMILY HISTORY: Family History  Problem Relation Age of Onset  . Diabetes Mother   . COPD Mother   .   Heart disease Father   . Cervical cancer Sister   . Colon cancer Neg Hx   . Colon polyps Neg Hx     ALLERGIES:  is allergic to pantoprazole sodium; penicillins; and sulfa antibiotics.  MEDICATIONS:  Current Outpatient Prescriptions  Medication Sig Dispense Refill  . aspirin EC 81 MG tablet Take 81 mg by mouth daily.    . cabergoline (DOSTINEX) 0.5 MG tablet Take 0.25 mg by mouth once a week.    . clopidogrel (PLAVIX) 75 MG tablet Take 1 tablet (75 mg total) by mouth daily. 30 tablet 0  . escitalopram (LEXAPRO) 10 MG tablet Take 10 mg by mouth at bedtime.    Marland Kitchen estradiol (ESTRACE) 2 MG tablet TAKE 1 TABLET BY MOUTH ONCE DAILY. 30 tablet 11  . Icosapent Ethyl (VASCEPA) 1 G CAPS  Take 2 capsules by mouth 2 (two) times daily.    . lansoprazole (PREVACID) 30 MG capsule Take 1 capsule twice daily (on an empty stomach at least 30 minutes before meal). 60 capsule 11  . NON FORMULARY Takes an OTC Potassium     Not sure the strength    . predniSONE (DELTASONE) 5 MG tablet Take 5 mg by mouth daily with breakfast.    . ranitidine (ZANTAC) 300 MG tablet TAKE ONE TABLET BY MOUTH AT BEDTIME. 30 tablet 5  . rosuvastatin (CRESTOR) 20 MG tablet Take 20 mg by mouth daily.     No current facility-administered medications for this visit.     REVIEW OF SYSTEMS:    Positive for poor appetite (chronic). 10 Point review of Systems was done is negative except as noted above.  PHYSICAL EXAMINATION: ECOG PERFORMANCE STATUS: 1 - Symptomatic but completely ambulatory  . Vitals:   03/17/16 1409  BP: (!) 156/69  Pulse: 68  Resp: 18  Temp: 98.2 F (36.8 C)   Filed Weights   03/17/16 1409  Weight: 109 lb (49.4 kg)   .Body mass index is 19.94 kg/m.  GENERAL:alert, in no acute distress and comfortable SKIN: skin color, texture, turgor are normal, no rashes or significant lesions EYES: normal, conjunctiva are pink and non-injected, sclera clear OROPHARYNX:no exudate, no erythema and lips, buccal mucosa, and tongue normal  NECK: supple, no JVD, thyroid normal size, non-tender, without nodularity LYMPH:  no palpable lymphadenopathy in the cervical, axillary or inguinal LUNGS: clear to auscultation with normal respiratory effort HEART: regular rate & rhythm,  no murmurs and no lower extremity edema ABDOMEN: abdomen soft, non-tender, normoactive bowel sounds , no palpable hepatosplenomegaly  Musculoskeletal: no cyanosis of digits and no clubbing  PSYCH: alert & oriented x 3 with fluent speech NEURO: no focal motor/sensory deficits  LABORATORY DATA:  I have reviewed the data as listed  . CBC Latest Ref Rng & Units 02/05/2016 02/05/2016 03/22/2012  WBC 4.0 - 10.5 K/uL 15.6(H) -  8.9  Hemoglobin 12.0 - 15.0 g/dL 13.6 - 12.0  Hematocrit 36.0 - 46.0 % 40.0 38.9 36.0  Platelets 150 - 400 K/uL 364 - 289    . CMP Latest Ref Rng & Units 02/05/2016 09/12/2012 03/22/2012  Glucose 65 - 99 mg/dL 98 100(H) 90  BUN 6 - 20 mg/dL 9 4(L) 8  Creatinine 0.44 - 1.00 mg/dL 0.68 0.72 0.63  Sodium 135 - 145 mmol/L 136 133(L) 137  Potassium 3.5 - 5.1 mmol/L 3.9 3.5 3.9  Chloride 101 - 111 mmol/L 99(L) 94(L) 100  CO2 22 - 32 mmol/L _0 Calcium 8.9 - 10.3 mg/dL 9.1 9.8  9.6  Total Protein 6.5 - 8.1 g/dL 7.8 - 7.5  Total Bilirubin 0.3 - 1.2 mg/dL 0.6 - 0.2(L)  Alkaline Phos 38 - 126 U/L 69 - 74  AST 15 - 41 U/L 35 - 13  ALT 14 - 54 U/L 21 - 7   Component     Latest Ref Rng & Units 02/05/2016  Retic Ct Pct     0.4 - 3.1 % 2.5  RBC.     3.87 - 5.11 MIL/uL 4.08  Retic Count, Manual     19.0 - 186.0 K/uL 102.0  Folate, Hemolysate     Not Estab. ng/mL >620.0  HCT     34.0 - 46.6 % 38.9  Folate, RBC     >498 ng/mL >1,594  LDH     98 - 192 U/L 202 (H)  Sed Rate     0 - 22 mm/hr 10  TSH     0.350 - 4.500 uIU/mL 1.684  T4,Free(Direct)     0.61 - 1.12 ng/dL 0.49 (L)  Vitamin B12     180 - 914 pg/mL 476       RADIOGRAPHIC STUDIES: I have personally reviewed the radiological images as listed and agreed with the findings in the report. Mm Digital Screening Bilateral  Result Date: 03/12/2016 CLINICAL DATA:  Screening. EXAM: DIGITAL SCREENING BILATERAL MAMMOGRAM WITH CAD COMPARISON:  Previous exam(s). ACR Breast Density Category c: The breast tissue is heterogeneously dense, which may obscure small masses. FINDINGS: There are no findings suspicious for malignancy. Images were processed with CAD. IMPRESSION: No mammographic evidence of malignancy. A result letter of this screening mammogram will be mailed directly to the patient. RECOMMENDATION: Screening mammogram in one year. (Code:SM-B-01Y) BI-RADS CATEGORY  1: Negative. Electronically Signed   By: Everlean Alstrom M.D.    On: 03/12/2016 10:35    ASSESSMENT & PLAN:   45 year old Caucasian female with  #1 leukocytosis - chronic since 2012 .  Predominantly neutrophilia but also intermittently has waxing and waning lymphocytosis. Flow cytometry did not show any clonal lymphocyte population. Her neutrophilia is likely reactive due to her smoking and chronic steroid use for adrenal insufficiency. Previously jak 2 testing was negative and BCR ABL testing was negative in 2012. Plan -Her counts are unlikely to represent a clonal lymphoproliferative or myeloproliferative process at this time. 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 -Would need to weigh the risks and benefits of chronic estrogen replacement therapy with her primary care physician . -No indication for bone marrow biopsy at this time . Continue to follow for now.   #2 pituitary adenoma/prolactinoma with adrenal insufficiency Plan -Patient follows with [facility]  -On chronic prednisone replacement . -Prior labs were sent to her endocrinologist at [facility].  We will continue to monitor her twice a year for 1-2 years. She has already been evaluated twice this year. Loletha said that she prefers to have her blood work done by her PCP out of convenience. I have requested that she had WBC checked again in the summer.  Patient will be called with the results of today's blood workup.  All of the patients questions were answered with apparent satisfaction. The patient knows to call the clinic with any problems, questions or concerns.   Molli Hazard, MD  03/17/2016 2:29 PM

## 2016-03-20 ENCOUNTER — Other Ambulatory Visit: Payer: Self-pay | Admitting: Gastroenterology

## 2016-03-26 ENCOUNTER — Ambulatory Visit: Payer: Self-pay | Admitting: "Endocrinology

## 2016-05-04 ENCOUNTER — Ambulatory Visit: Payer: Self-pay | Admitting: "Endocrinology

## 2016-05-10 ENCOUNTER — Encounter (HOSPITAL_COMMUNITY): Payer: Self-pay | Admitting: Hematology & Oncology

## 2016-06-15 ENCOUNTER — Ambulatory Visit (INDEPENDENT_AMBULATORY_CARE_PROVIDER_SITE_OTHER): Payer: BLUE CROSS/BLUE SHIELD | Admitting: "Endocrinology

## 2016-06-15 ENCOUNTER — Encounter: Payer: Self-pay | Admitting: "Endocrinology

## 2016-06-15 VITALS — BP 136/85 | HR 76 | Ht 62.0 in | Wt 106.0 lb

## 2016-06-15 DIAGNOSIS — D352 Benign neoplasm of pituitary gland: Secondary | ICD-10-CM | POA: Diagnosis not present

## 2016-06-15 DIAGNOSIS — E038 Other specified hypothyroidism: Secondary | ICD-10-CM | POA: Diagnosis not present

## 2016-06-15 DIAGNOSIS — E0590 Thyrotoxicosis, unspecified without thyrotoxic crisis or storm: Secondary | ICD-10-CM | POA: Insufficient documentation

## 2016-06-15 DIAGNOSIS — E221 Hyperprolactinemia: Secondary | ICD-10-CM

## 2016-06-15 DIAGNOSIS — E2740 Unspecified adrenocortical insufficiency: Secondary | ICD-10-CM | POA: Diagnosis not present

## 2016-06-15 NOTE — Progress Notes (Signed)
Subjective:    Patient ID: [name], female    DOB: September 15, 1970, PCP ROBERTSON, Thomasena Edis, PA-C   Past Medical History:  Diagnosis Date  . Bleeding ulcer   . Bronchitis    hx of  . Carotid artery disease ([place])    bilateral internal carotid artery occlusion by duplex ultrasound  . Cervical cancer ([place])   . Chronic headaches   . GERD (gastroesophageal reflux disease)   . Hyperlipidemia   . Pituitary tumor   . PUD (peptic ulcer disease)    NSAIDS   . Stenosis of right carotid artery   . TIA (transient ischemic attack)    Past Surgical History:  Procedure Laterality Date  . ABDOMINAL HYSTERECTOMY    . BLADDER SUSPENSION    . ESOPHAGOGASTRODUODENOSCOPY  02/2010   Dr. Barney Drain. Multiple duodenal ulcers, gastric erosions (bx negative for H.Pylori)  . ESOPHAGOGASTRODUODENOSCOPY N/A 06/19/2013   SLF: 1. Earlene Plater /dyspepsia due to uncontrolled GERD, & NSAID gastritis/duodenitis.  . Multiple cyst removal     coccyx, left ring finger, pelvic area & throat/vocal cord and pilonidal cyst  . RADIOLOGY WITH ANESTHESIA  03/23/2012   Procedure: RADIOLOGY WITH ANESTHESIA;  Surgeon: Rob Hickman, MD;  Location: [facility];  Service: Radiology;  Laterality: N/A;  . TOTAL VAGINAL HYSTERECTOMY     Social History   Social History  . Marital status: Married    Spouse name: N/A  . Number of children: N/A  . Years of education: N/A   Occupational History  . unemployed Unemployed   Social History Main Topics  . Smoking status: Current Every Day Smoker    Packs/day: 0.50    Years: 25.00    Types: Cigarettes    Last attempt to quit: 01/12/2012  . Smokeless tobacco: Former Systems developer    Quit date: 03/09/2012     Comment: smoking cigarettes   6-8 daily  . Alcohol use 8.4 - 16.8 oz/week    14 - 28 Cans of beer per week     Comment: drinks 2-4 beers a day; occassional  . Drug use: No  . Sexual activity: Yes    Partners: Male    Birth control/ protection: Surgical     Comment: spouse    Other Topics Concern  . None   Social History Narrative  . None   Outpatient Encounter Prescriptions as of 06/15/2016  Medication Sig  . aspirin EC 81 MG tablet Take 81 mg by mouth daily.  . clopidogrel (PLAVIX) 75 MG tablet Take 1 tablet (75 mg total) by mouth daily.  Marland Kitchen escitalopram (LEXAPRO) 10 MG tablet Take 10 mg by mouth at bedtime.  Marland Kitchen estradiol (ESTRACE) 2 MG tablet TAKE 1 TABLET BY MOUTH ONCE DAILY.  Marland Kitchen Icosapent Ethyl (VASCEPA) 1 G CAPS Take 2 capsules by mouth 2 (two) times daily.  . lansoprazole (PREVACID) 30 MG capsule Take 1 capsule twice daily (on an empty stomach at least 30 minutes before meal).  . Multiple Vitamin (MULTIVITAMIN) tablet Take 1 tablet by mouth daily.  . NON FORMULARY Takes an OTC Potassium     Not sure the strength  . ranitidine (ZANTAC) 300 MG tablet TAKE ONE TABLET BY MOUTH AT BEDTIME.  . rosuvastatin (CRESTOR) 20 MG tablet Take 20 mg by mouth daily.  . [DISCONTINUED] cabergoline (DOSTINEX) 0.5 MG tablet Take 0.25 mg by mouth once a week.  . [DISCONTINUED] predniSONE (DELTASONE) 5 MG tablet Take 5 mg by mouth daily with breakfast.   No facility-administered  encounter medications on file as of 06/15/2016.    ALLERGIES: Allergies  Allergen Reactions  . Pantoprazole Sodium Itching and Rash  . Penicillins Itching and Rash  . Sulfa Antibiotics Itching and Rash    VACCINATION STATUS:  There is no immunization history on file for this patient.  HPI 46 year old female patient with multiple medical problems as above. She is being seen in consultation for history of adrenal insufficiency, hyperprolactinemia/pituitary microadenoma, and recent abnormal thyroid hormone profile. - Based on her report, she was being followed at [facility] for the above problems, until 2016. - She has prior exposure to high-dose steroids which reportedly put her for adrenal insufficiency which triggered the need for her to be on prednisone 7.5 mg daily for at least 4  years. She decided to stop all steroid support at least a month ago by her own. -She was also treated with cabergoline for hyperprolactinemia of 101 and April 2015 which subsequently dropped to 0.3 in July 2016. She has not taking any cabergoline for a year and half. - He was recently found to have subnormal free T4 of 0.49, associated with TSH of 1.68. She never required thyroid hormone supplement. - Historically she is always light build. She underwent total hysterectomy due to endometriosis in 2003, currently on estradiol 2 mg daily. -She never had bone density done. She complains of chronic fatigue, poor appetite, failure to gain weight, however she denies any recent hospitalization for adrenal crisis. - She has history of leukocytosis/ thrombocytosis, bilateral carotid artery stenosis complicated by cerebrovascular accident.   Review of Systems  Constitutional: + Light build, + fatigue, +subjective  hypothermia Eyes: no blurry vision, no xerophthalmia ENT: no sore throat, no nodules palpated in throat, no dysphagia/odynophagia, no hoarseness Cardiovascular: no Chest Pain, no Shortness of Breath, no palpitations, no leg swelling Respiratory:  + cough, no SOB Gastrointestinal: no Nausea/Vomiting/Diarhhea, + poor appetite Musculoskeletal: no muscle/joint aches Skin:  + rashes Neurological: no tremors, no numbness, no tingling, no dizziness Psychiatric: no depression, no anxiety  Objective:    BP 136/85   Pulse 76   Ht 5\' 2"  (1.575 m)   Wt 106 lb (48.1 kg)   BMI 19.39 kg/m   Wt Readings from Last 3 Encounters:  06/15/16 106 lb (48.1 kg)  03/17/16 109 lb (49.4 kg)  02/05/16 108 lb (49 kg)    Physical Exam  Constitutional:  + light build, not in acute distress, normal state of mind Eyes: PERRLA, EOMI, no exophthalmos ENT: moist mucous membranes, no thyromegaly, no cervical lymphadenopathy Cardiovascular: normal precordial activity, Regular Rate and Rhythm, no  Murmur/Rubs/Gallops Respiratory:  adequate breathing efforts, no gross chest deformity, Clear to auscultation bilaterally Gastrointestinal: abdomen soft, Non -tender, No distension, Bowel Sounds present Musculoskeletal: no gross deformities, strength intact in all four extremities Skin: moist,   +Tan Skin ,  +no active rashes Neurological: no tremor with outstretched hands, Deep tendon reflexes normal in all four extremities.  CMP ( most recent) CMP     Component Value Date/Time   NA 136 02/05/2016 1516   K 3.9 02/05/2016 1516   CL 99 (L) 02/05/2016 1516   CO2 27 02/05/2016 1516   GLUCOSE 98 02/05/2016 1516   BUN 9 02/05/2016 1516   CREATININE 0.68 02/05/2016 1516   CALCIUM 9.1 02/05/2016 1516   PROT 7.8 02/05/2016 1516   ALBUMIN 4.2 02/05/2016 1516   AST 35 02/05/2016 1516   ALT 21 02/05/2016 1516   ALKPHOS 69 02/05/2016 1516  BILITOT 0.6 02/05/2016 1516   GFRNONAA >60 02/05/2016 1516   GFRAA >60 02/05/2016 1516   Labs from 02/05/2016 showed TSH 1.68, free T4 0.49  Assessment & Plan:   1. Other specified hypothyroidism 2. Adrenal insufficiency ([place]) 3. Hyperprolactinemia ([place]) 4. Pituitary microadenoma ([place])  - I have reviewed her available records and clinically evaluated this patient. - She has long and complicated medical history including what appears to be steroid induced adrenal insufficiency which required up to 7.5 mg of prednisone daily for at least 6 years, until she decided to stop prednisone a month ago. She denied adrenal crisis. -This is unintended opportunity to  baseline Adrenal residual functional analysis. - I will proceed to obtain a.m. cortisol tomorrow morning. If a.m. cortisol is not optimal, she would be considered for ACTH stim elation test. I advised her to stay off of prednisone for now. - Regarding her history of hyperprolactinemia related to history microadenoma (7 mm pituitary adenoma based on MRI in 2012), I would not reinitiate cabergoline  for now. - I will request prolactin level along with her lab work tomorrow.  - Regarding subnormal free T4 in October 2017 which may indicate clinically significant hypothyroidism. - I will proceed to obtain repeat for profile thyroid function test including TSH/free T4, free T3, TPO antibodies and thyroglobulin antibodies.  - Due to her body habitus being light build and propensity for multiple autoimmune dysfunction including premature surgical menopause, she will need a screening bone density test for osteoporosis.  - I advised patient to maintain close follow up with ROBERTSON, ANTHONY T, PA-C for primary care needs. Follow up plan: Return in about 1 week (around 06/22/2016) for follow up with pre-visit labs  8 AM tomorrow.Glade Lloyd, MD Phone: [phone]  Fax: [phone]   06/15/2016, 4:10 PM

## 2016-06-16 LAB — T3, FREE: T3, Free: 2.8 pg/mL (ref 2.3–4.2)

## 2016-06-16 LAB — THYROID PEROXIDASE ANTIBODY: Thyroperoxidase Ab SerPl-aCnc: <1 IU/mL (ref <9)

## 2016-06-16 LAB — THYROGLOBULIN ANTIBODY: Thyroglobulin Ab: 1 IU/mL (ref <2)

## 2016-06-16 LAB — TSH: TSH: 4.36 m[IU]/L

## 2016-06-16 LAB — T4, FREE: Free T4: 0.7 ng/dL — ABNORMAL LOW (ref 0.8–1.8)

## 2016-06-16 LAB — PROLACTIN: Prolactin: 11.9 ng/mL

## 2016-06-17 LAB — VITAMIN D 25 HYDROXY (VIT D DEFICIENCY, FRACTURES): Vit D, 25-Hydroxy: 34 ng/mL (ref 30–100)

## 2016-06-17 LAB — CORTISOL-AM, BLOOD: CORTISOL - AM: 11.8 ug/dL

## 2016-06-19 ENCOUNTER — Inpatient Hospital Stay (HOSPITAL_COMMUNITY): Admission: RE | Admit: 2016-06-19 | Payer: BLUE CROSS/BLUE SHIELD | Source: Ambulatory Visit

## 2016-06-22 ENCOUNTER — Ambulatory Visit: Payer: BLUE CROSS/BLUE SHIELD | Admitting: "Endocrinology

## 2016-06-24 ENCOUNTER — Encounter: Payer: Self-pay | Admitting: "Endocrinology

## 2016-06-24 ENCOUNTER — Ambulatory Visit (INDEPENDENT_AMBULATORY_CARE_PROVIDER_SITE_OTHER): Payer: BLUE CROSS/BLUE SHIELD | Admitting: "Endocrinology

## 2016-06-24 VITALS — BP 132/83 | HR 70 | Ht 62.0 in | Wt 106.0 lb

## 2016-06-24 DIAGNOSIS — E782 Mixed hyperlipidemia: Secondary | ICD-10-CM

## 2016-06-24 DIAGNOSIS — E2740 Unspecified adrenocortical insufficiency: Secondary | ICD-10-CM

## 2016-06-24 DIAGNOSIS — E038 Other specified hypothyroidism: Secondary | ICD-10-CM

## 2016-06-24 DIAGNOSIS — E221 Hyperprolactinemia: Secondary | ICD-10-CM

## 2016-06-24 DIAGNOSIS — D352 Benign neoplasm of pituitary gland: Secondary | ICD-10-CM | POA: Diagnosis not present

## 2016-06-24 MED ORDER — LEVOTHYROXINE SODIUM 50 MCG PO TABS: 50.0000 ug | ORAL_TABLET | Freq: Every day | ORAL | 6 refills | Status: DC

## 2016-06-24 NOTE — Progress Notes (Signed)
Subjective:    Patient ID: [name], female    DOB: 1970/06/08, PCP ROBERTSON, Thomasena Edis, PA-C   Past Medical History:  Diagnosis Date  . Bleeding ulcer   . Bronchitis    hx of  . Carotid artery disease ([place])    bilateral internal carotid artery occlusion by duplex ultrasound  . Cervical cancer ([place])   . Chronic headaches   . GERD (gastroesophageal reflux disease)   . Hyperlipidemia   . Pituitary tumor   . PUD (peptic ulcer disease)    NSAIDS   . Stenosis of right carotid artery   . TIA (transient ischemic attack)    Past Surgical History:  Procedure Laterality Date  . ABDOMINAL HYSTERECTOMY    . BLADDER SUSPENSION    . ESOPHAGOGASTRODUODENOSCOPY  02/2010   Dr. Barney Drain. Multiple duodenal ulcers, gastric erosions (bx negative for H.Pylori)  . ESOPHAGOGASTRODUODENOSCOPY N/A 06/19/2013   SLF: 1. Earlene Plater /dyspepsia due to uncontrolled GERD, & NSAID gastritis/duodenitis.  . Multiple cyst removal     coccyx, left ring finger, pelvic area & throat/vocal cord and pilonidal cyst  . RADIOLOGY WITH ANESTHESIA  03/23/2012   Procedure: RADIOLOGY WITH ANESTHESIA;  Surgeon: Rob Hickman, MD;  Location: [facility];  Service: Radiology;  Laterality: N/A;  . TOTAL VAGINAL HYSTERECTOMY     Social History   Social History  . Marital status: Married    Spouse name: N/A  . Number of children: N/A  . Years of education: N/A   Occupational History  . unemployed Unemployed   Social History Main Topics  . Smoking status: Current Every Day Smoker    Packs/day: 0.50    Years: 25.00    Types: Cigarettes    Last attempt to quit: 01/12/2012  . Smokeless tobacco: Former Systems developer    Quit date: 03/09/2012     Comment: smoking cigarettes   6-8 daily  . Alcohol use 8.4 - 16.8 oz/week    14 - 28 Cans of beer per week     Comment: drinks 2-4 beers a day; occassional  . Drug use: No  . Sexual activity: Yes    Partners: Male    Birth control/ protection: Surgical     Comment: spouse    Other Topics Concern  . None   Social History Narrative  . None   Outpatient Encounter Prescriptions as of 06/24/2016  Medication Sig  . aspirin EC 81 MG tablet Take 81 mg by mouth daily.  . clopidogrel (PLAVIX) 75 MG tablet Take 1 tablet (75 mg total) by mouth daily.  Marland Kitchen escitalopram (LEXAPRO) 10 MG tablet Take 10 mg by mouth at bedtime.  Marland Kitchen estradiol (ESTRACE) 2 MG tablet TAKE 1 TABLET BY MOUTH ONCE DAILY.  Marland Kitchen Icosapent Ethyl (VASCEPA) 1 G CAPS Take 2 capsules by mouth 2 (two) times daily.  . lansoprazole (PREVACID) 30 MG capsule Take 1 capsule twice daily (on an empty stomach at least 30 minutes before meal).  Marland Kitchen levothyroxine (SYNTHROID, LEVOTHROID) 50 MCG tablet Take 1 tablet (50 mcg total) by mouth daily.  . Multiple Vitamin (MULTIVITAMIN) tablet Take 1 tablet by mouth daily.  . NON FORMULARY Takes an OTC Potassium     Not sure the strength  . ranitidine (ZANTAC) 300 MG tablet TAKE ONE TABLET BY MOUTH AT BEDTIME.  . rosuvastatin (CRESTOR) 20 MG tablet Take 20 mg by mouth daily.   No facility-administered encounter medications on file as of 06/24/2016.    ALLERGIES: Allergies  Allergen Reactions  .  Pantoprazole Sodium Itching and Rash  . Penicillins Itching and Rash  . Sulfa Antibiotics Itching and Rash    VACCINATION STATUS:  There is no immunization history on file for this patient.  HPI 46 year old female patient with multiple medical problems as above. She is Here today with labs to review regarding  her history of adrenal insufficiency, hyperprolactinemia/pituitary microadenoma, and recent abnormal thyroid hormone profile. - Based on her report, she was being followed at [facility] for the above problems, until 2016. - She has prior exposure to high-dose steroids which reportedly put her for adrenal insufficiency which triggered the need for her to be on prednisone 7.5 mg daily for at least 4 years. She decided to stop all steroid support at least a month ago by  her own. -She was also treated with cabergoline for hyperprolactinemia of 101 and April 2015 which subsequently dropped to 0.3 in July 2016. She has not taking any cabergoline for a year and half. - He was recently found to have subnormal free T4 of 0.49, associated with TSH of 1.68. She never required thyroid hormone supplement. - Historically she is always light build. She underwent total hysterectomy due to endometriosis in 2003, currently on estradiol 2 mg daily. -She never had bone density done. She complains of chronic fatigue, poor appetite, failure to gain weight, however she denies any recent hospitalization for adrenal crisis. - She has history of leukocytosis/ thrombocytosis, bilateral carotid artery stenosis complicated by cerebrovascular accident.   Review of Systems  Constitutional: + Light build, + fatigue, +subjective  hypothermia Eyes: no blurry vision, no xerophthalmia ENT: no sore throat, no nodules palpated in throat, no dysphagia/odynophagia, no hoarseness Cardiovascular: no Chest Pain, no Shortness of Breath, no palpitations, no leg swelling Respiratory:  + cough, no SOB Gastrointestinal: no Nausea/Vomiting/Diarhhea, + poor appetite Musculoskeletal: no muscle/joint aches Skin:  + rashes Neurological: no tremors, no numbness, no tingling, no dizziness Psychiatric: no depression, no anxiety  Objective:    BP 132/83   Pulse 70   Ht 5\' 2"  (1.575 m)   Wt 106 lb (48.1 kg)   BMI 19.39 kg/m   Wt Readings from Last 3 Encounters:  06/24/16 106 lb (48.1 kg)  06/15/16 106 lb (48.1 kg)  03/17/16 109 lb (49.4 kg)    Physical Exam  Constitutional:  + light build, not in acute distress, normal state of mind Eyes: PERRLA, EOMI, no exophthalmos ENT: moist mucous membranes, no thyromegaly, no cervical lymphadenopathy Cardiovascular: normal precordial activity, Regular Rate and Rhythm, no Murmur/Rubs/Gallops Respiratory:  adequate breathing efforts, no gross chest deformity,  Clear to auscultation bilaterally Gastrointestinal: abdomen soft, Non -tender, No distension, Bowel Sounds present Musculoskeletal: no gross deformities, strength intact in all four extremities Skin: moist,   +Tan Skin ,  +no active rashes Neurological: no tremor with outstretched hands, Deep tendon reflexes normal in all four extremities.  CMP ( most recent) CMP     Component Value Date/Time   NA 136 02/05/2016 1516   K 3.9 02/05/2016 1516   CL 99 (L) 02/05/2016 1516   CO2 27 02/05/2016 1516   GLUCOSE 98 02/05/2016 1516   BUN 9 02/05/2016 1516   CREATININE 0.68 02/05/2016 1516   CALCIUM 9.1 02/05/2016 1516   PROT 7.8 02/05/2016 1516   ALBUMIN 4.2 02/05/2016 1516   AST 35 02/05/2016 1516   ALT 21 02/05/2016 1516   ALKPHOS 69 02/05/2016 1516   BILITOT 0.6 02/05/2016 1516   GFRNONAA >60 02/05/2016 1516   GFRAA >  60 02/05/2016 1516   Recent Results (from the past 2160 hour(s))  TSH     Status: None   Collection Time: 06/15/16  2:02 PM  Result Value Ref Range   TSH 4.36 mIU/L    Comment:   Reference Range   > or = 20 Years  0.40-4.50   Pregnancy Range First trimester  0.26-2.66 Second trimester 0.55-2.73 Third trimester  0.43-2.91     T4, free     Status: Abnormal   Collection Time: 06/15/16  2:02 PM  Result Value Ref Range   Free T4 0.7 (L) 0.8 - 1.8 ng/dL  Thyroid peroxidase antibody     Status: None   Collection Time: 06/15/16  2:02 PM  Result Value Ref Range   Thyroperoxidase Ab SerPl-aCnc <1 <9 IU/mL  Thyroglobulin antibody     Status: None   Collection Time: 06/15/16  2:02 PM  Result Value Ref Range   Thyroglobulin Ab 1 <2 IU/mL  T3, free     Status: None   Collection Time: 06/15/16  2:02 PM  Result Value Ref Range   T3, Free 2.8 2.3 - 4.2 pg/mL  VITAMIN D 25 Hydroxy (Vit-D Deficiency, Fractures)     Status: None   Collection Time: 06/15/16  2:25 PM  Result Value Ref Range   Vit D, 25-Hydroxy 34 30 - 100 ng/mL    Comment: Vitamin D Status            25-OH Vitamin D        Deficiency                <20 ng/mL        Insufficiency         20 - 29 ng/mL        Optimal             > or = 30 ng/mL   For 25-OH Vitamin D testing on patients on D2-supplementation and patients for whom quantitation of D2 and D3 fractions is required, the Quest[facility] 25-OH VIT D, (D2,D3), LC/MS/MS is recommended: order code [phone] (patients > 2 yrs).   Prolactin     Status: None   Collection Time: 06/15/16  4:39 PM  Result Value Ref Range   Prolactin 11.9 ng/mL    Comment: Reference Range Adult Female  Non-Pregnant 3.0-30.0 ng/mL  Pregnant 10.0-209.0 ng/mL  Postmenopausal 2.0-20.0 ng/mL     Cortisol-am, blood     Status: None   Collection Time: 06/16/16  7:22 AM  Result Value Ref Range   Cortisol - AM 11.8 mcg/dL    Comment:   Reference Range 8 a.m. (7-9 a.m.) Specimen: 4.0-22.0      Labs from 02/05/2016 showed TSH 1.68, free T4 0.49  Assessment & Plan:   1. Other specified hypothyroidism 2. Adrenal insufficiency ([place]) 3. Hyperprolactinemia ([place]) 4. Pituitary microadenoma ([place])   - She has long and complicated medical history including what appears to be steroid induced adrenal insufficiency which required up to 7.5 mg of prednisone daily for at least 6 years, until she decided to stop prednisone a month ago. She denied adrenal crisis.  - Based on her a.m. cortisol of 11.8 which is adequate adrenal response, she will not require glucocorticoid support at this time. I advised her to stay off of prednisone for now.  - Regarding her history of hyperprolactinemia related to history microadenoma (7 mm pituitary adenoma based on MRI in 2012). - Her prolactin level is at 11.9, acceptable off of  cabergoline therapy. I advised her to continue off therapy for now. She will not require any surveillance MRI of sella/pituitary this time.   - Her labs are consistent with hypothyroidism. I discussed and initiated low-dose levothyroxine for her. I will  prescribe levothyroxine 50 g by mouth every morning.  - We discussed about correct intake of levothyroxine, at fasting, with water, separated by at least 30 minutes from breakfast, and separated by more than 4 hours from calcium, iron, multivitamins, acid reflux medications (PPIs). -Patient is made aware of the fact that thyroid hormone replacement is needed for life, dose to be adjusted by periodic monitoring of thyroid function tests.    - Due to her body habitus being light build and propensity for multiple autoimmune dysfunction including premature surgical menopause, she will need a screening bone density test for osteoporosis- which is pending.  - I advised patient to maintain close follow up with ROBERTSON, ANTHONY T, PA-C for primary care needs. Follow up plan: Return in about 6 months (around 12/25/2016) for follow up with pre-visit labs.  Glade Lloyd, MD Phone: [phone]  Fax: [phone]   06/24/2016, 4:17 PM

## 2016-06-25 ENCOUNTER — Other Ambulatory Visit: Payer: Self-pay | Admitting: Gastroenterology

## 2016-09-14 ENCOUNTER — Other Ambulatory Visit (HOSPITAL_COMMUNITY): Payer: Self-pay | Admitting: *Deleted

## 2016-09-14 DIAGNOSIS — D72828 Other elevated white blood cell count: Secondary | ICD-10-CM

## 2016-09-15 ENCOUNTER — Ambulatory Visit (HOSPITAL_COMMUNITY): Payer: BLUE CROSS/BLUE SHIELD | Admitting: Adult Health

## 2016-09-15 ENCOUNTER — Other Ambulatory Visit (HOSPITAL_COMMUNITY): Payer: BLUE CROSS/BLUE SHIELD

## 2016-09-19 ENCOUNTER — Other Ambulatory Visit: Payer: Self-pay | Admitting: Nurse Practitioner

## 2016-11-25 ENCOUNTER — Other Ambulatory Visit: Payer: Self-pay | Admitting: Gastroenterology

## 2016-11-25 ENCOUNTER — Other Ambulatory Visit: Payer: Self-pay | Admitting: Obstetrics & Gynecology

## 2016-11-25 NOTE — Telephone Encounter (Signed)
Patient needs OV for further refills.  Limited supply sent in today.

## 2016-11-26 ENCOUNTER — Encounter: Payer: Self-pay | Admitting: Gastroenterology

## 2016-12-28 LAB — HEMOGLOBIN A1C: Hemoglobin A1C: 5.3

## 2016-12-28 LAB — TSH: TSH: 0.68 (ref <=5.90)

## 2016-12-31 ENCOUNTER — Encounter: Payer: Self-pay | Admitting: "Endocrinology

## 2016-12-31 ENCOUNTER — Ambulatory Visit (INDEPENDENT_AMBULATORY_CARE_PROVIDER_SITE_OTHER): Payer: BLUE CROSS/BLUE SHIELD | Admitting: "Endocrinology

## 2016-12-31 VITALS — BP 106/71 | HR 76 | Ht 62.0 in | Wt 104.0 lb

## 2016-12-31 DIAGNOSIS — E2740 Unspecified adrenocortical insufficiency: Secondary | ICD-10-CM | POA: Diagnosis not present

## 2016-12-31 DIAGNOSIS — E038 Other specified hypothyroidism: Secondary | ICD-10-CM | POA: Diagnosis not present

## 2016-12-31 MED ORDER — LEVOTHYROXINE SODIUM 50 MCG PO TABS: 50.0000 ug | ORAL_TABLET | Freq: Every day | ORAL | 6 refills | Status: DC

## 2016-12-31 NOTE — Progress Notes (Signed)
Subjective:    Patient ID: [name], female    DOB: 1971/02/20, PCP Mackey Birchwood   Past Medical History:  Diagnosis Date  . Bleeding ulcer   . Bronchitis    hx of  . Carotid artery disease ([place])    bilateral internal carotid artery occlusion by duplex ultrasound  . Cervical cancer ([place])   . Chronic headaches   . GERD (gastroesophageal reflux disease)   . Hyperlipidemia   . Pituitary tumor   . PUD (peptic ulcer disease)    NSAIDS   . Stenosis of right carotid artery   . TIA (transient ischemic attack)    Past Surgical History:  Procedure Laterality Date  . ABDOMINAL HYSTERECTOMY    . BLADDER SUSPENSION    . ESOPHAGOGASTRODUODENOSCOPY  02/2010   Dr. Barney Drain. Multiple duodenal ulcers, gastric erosions (bx negative for H.Pylori)  . ESOPHAGOGASTRODUODENOSCOPY N/A 06/19/2013   SLF: 1. Earlene Plater /dyspepsia due to uncontrolled GERD, & NSAID gastritis/duodenitis.  . Multiple cyst removal     coccyx, left ring finger, pelvic area & throat/vocal cord and pilonidal cyst  . RADIOLOGY WITH ANESTHESIA  03/23/2012   Procedure: RADIOLOGY WITH ANESTHESIA;  Surgeon: Rob Hickman, MD;  Location: [facility];  Service: Radiology;  Laterality: N/A;  . TOTAL VAGINAL HYSTERECTOMY     Social History   Social History  . Marital status: Married    Spouse name: N/A  . Number of children: N/A  . Years of education: N/A   Occupational History  . unemployed Unemployed   Social History Main Topics  . Smoking status: Current Every Day Smoker    Packs/day: 0.50    Years: 25.00    Types: Cigarettes    Last attempt to quit: 01/12/2012  . Smokeless tobacco: Former Systems developer    Quit date: 03/09/2012     Comment: smoking cigarettes   6-8 daily  . Alcohol use 8.4 - 16.8 oz/week    14 - 28 Cans of beer per week     Comment: drinks 2-4 beers a day; occassional  . Drug use: No  . Sexual activity: Yes    Partners: Male    Birth control/ protection: Surgical     Comment: spouse    Other Topics Concern  . None   Social History Narrative  . None   Outpatient Encounter Prescriptions as of 12/31/2016  Medication Sig  . aspirin EC 81 MG tablet Take 81 mg by mouth daily.  . clopidogrel (PLAVIX) 75 MG tablet Take 1 tablet (75 mg total) by mouth daily.  Marland Kitchen estradiol (ESTRACE) 2 MG tablet TAKE 1 TABLET BY MOUTH ONCE DAILY.  Marland Kitchen Icosapent Ethyl (VASCEPA) 1 G CAPS Take 2 capsules by mouth 2 (two) times daily.  . lansoprazole (PREVACID) 30 MG capsule TAKE (1) CAPSULE TWICE DAILY (TAKE ON AN EMPTY STOMACH AT LEAST 104M IN - UTES BEFORE MEALS).  Marland Kitchen levothyroxine (SYNTHROID, LEVOTHROID) 50 MCG tablet Take 1 tablet (50 mcg total) by mouth daily.  . Multiple Vitamin (MULTIVITAMIN) tablet Take 1 tablet by mouth daily.  . ranitidine (ZANTAC) 300 MG tablet TAKE ONE TABLET BY MOUTH AT BEDTIME.  . rosuvastatin (CRESTOR) 20 MG tablet Take 20 mg by mouth daily.  . [DISCONTINUED] escitalopram (LEXAPRO) 10 MG tablet Take 10 mg by mouth at bedtime.  . [DISCONTINUED] levothyroxine (SYNTHROID, LEVOTHROID) 50 MCG tablet Take 1 tablet (50 mcg total) by mouth daily. (Patient taking differently: Take 25 mcg by mouth daily. )  . [DISCONTINUED]  NON FORMULARY Takes an OTC Potassium     Not sure the strength   No facility-administered encounter medications on file as of 12/31/2016.    ALLERGIES: Allergies  Allergen Reactions  . Pantoprazole Sodium Itching and Rash  . Penicillins Itching and Rash  . Sulfa Antibiotics Itching and Rash    VACCINATION STATUS:  There is no immunization history on file for this patient.  HPI 46 year old female patient with multiple medical problems as above. She is Here today with labs to review regarding  her history of adrenal insufficiency, hyperprolactinemia/pituitary microadenoma, and Hypothyroidism. - Patient continues to feel better. She has changed her levothyroxine dose to 25 g from 50 g. - In the interim, she did have what appears to be hyponatremia  induced by drinking excessive amount of water. - Her labs from 12/21/2016 showed normal CMP with sodium 140, free T4 still subnormal at 0.79, TSH 1.73.  -  she denies any recent hospitalization for adrenal crisis. - She has history of leukocytosis/ thrombocytosis, bilateral carotid artery stenosis complicated by cerebrovascular accident.   Review of Systems  Constitutional: + Light build, + fatigue, +subjective  hypothermia Eyes: no blurry vision, no xerophthalmia ENT: no sore throat, no nodules palpated in throat, no dysphagia/odynophagia, no hoarseness Cardiovascular: no Chest Pain, no Shortness of Breath, no palpitations, no leg swelling Respiratory:  + cough, no SOB Gastrointestinal: no Nausea/Vomiting/Diarhhea, + poor appetite Musculoskeletal: no muscle/joint aches Skin:  + rashes Neurological: no tremors, no numbness, no tingling, no dizziness Psychiatric: no depression, no anxiety  Objective:    BP 106/71   Pulse 76   Ht 5\' 2"  (1.575 m)   Wt 104 lb (47.2 kg)   BMI 19.02 kg/m   Wt Readings from Last 3 Encounters:  12/31/16 104 lb (47.2 kg)  06/24/16 106 lb (48.1 kg)  06/15/16 106 lb (48.1 kg)    Physical Exam  Constitutional:  + light build, not in acute distress, normal state of mind Eyes: PERRLA, EOMI, no exophthalmos ENT: moist mucous membranes, no thyromegaly, no cervical lymphadenopathy Cardiovascular: normal precordial activity, Regular Rate and Rhythm, no Murmur/Rubs/Gallops Respiratory:  adequate breathing efforts, no gross chest deformity, Clear to auscultation bilaterally Gastrointestinal: abdomen soft, Non -tender, No distension, Bowel Sounds present Musculoskeletal: no gross deformities, strength intact in all four extremities Skin: moist,   +Tan Skin ,  +no active rashes Neurological: no tremor with outstretched hands, Deep tendon reflexes normal in all four extremities.  CMP ( most recent) CMP     Component Value Date/Time   NA 136 02/05/2016 1516    K 3.9 02/05/2016 1516   CL 99 (L) 02/05/2016 1516   CO2 27 02/05/2016 1516   GLUCOSE 98 02/05/2016 1516   BUN 9 02/05/2016 1516   CREATININE 0.68 02/05/2016 1516   CALCIUM 9.1 02/05/2016 1516   PROT 7.8 02/05/2016 1516   ALBUMIN 4.2 02/05/2016 1516   AST 35 02/05/2016 1516   ALT 21 02/05/2016 1516   ALKPHOS 69 02/05/2016 1516   BILITOT 0.6 02/05/2016 1516   GFRNONAA >60 02/05/2016 1516   GFRAA >60 02/05/2016 1516   No results found for this or any previous visit (from the past 2160 hour(s)). Labs from 02/05/2016 showed TSH 1.68, free T4 0.49   Labs from 12/21/2016: TSH 1.7, free T4 0.79 (normal 0.8-1 277), sodium 140, potassium 5.4, leukocytosis at 13, normal lipid panel.  Assessment & Plan:   1. hypothyroidism 2. Adrenal insufficiency ([place]) 3. Hyperprolactinemia ([place]) 4. Pituitary microadenoma ([place])   -  She has long and complicated medical history including what appears to be steroid induced adrenal insufficiency which required up to 7.5 mg of prednisone daily for at least 6 years, until she decided to stop prednisone In February 2018. She denied adrenal crisis.  - Based on her a.m. cortisol of 11.8  on 06/16/2016 which is adequate adrenal response, she will not require glucocorticoid support at this time. I advised her to stay off of prednisone for now. She will need a.m. cortisol with her next labs.  - Regarding her history of hyperprolactinemia related to history microadenoma (7 mm pituitary adenoma based on MRI in 2012). - Her prolactin level is at 11.9 on 06/17/2078, acceptable off of cabergoline therapy. I advised her to continue off therapy for now. She will not require any surveillance MRI of sella/pituitary this time.   - Her labs today and during her last visit are consistent with hypothyroidism.  - She would benefit from the higher dose of levothyroxine. She agrees to resume levothyroxine at 50  by mouth every morning.   - We discussed about correct intake of  levothyroxine, at fasting, with water, separated by at least 30 minutes from breakfast, and separated by more than 4 hours from calcium, iron, multivitamins, acid reflux medications (PPIs). -Patient is made aware of the fact that thyroid hormone replacement is needed for life, dose to be adjusted by periodic monitoring of thyroid function tests.  - Due to her body habitus being light build and propensity for multiple autoimmune dysfunction including premature surgical menopause, she will need a screening bone density test for osteoporosis- which is pending.  - I advised patient to maintain close follow up with Bronson Curb, PA-C for primary care needs. Follow up plan: Return in about 4 months (around 05/02/2017) for follow up with pre-visit labs.  Glade Lloyd, MD Phone: [phone]  Fax: [phone]  This note was partially dictated with voice recognition software. Similar sounding words can be transcribed inadequately or may not  be corrected upon review.  12/31/2016, 3:36 PM

## 2017-01-18 ENCOUNTER — Ambulatory Visit (INDEPENDENT_AMBULATORY_CARE_PROVIDER_SITE_OTHER): Payer: BLUE CROSS/BLUE SHIELD | Admitting: Gastroenterology

## 2017-01-18 ENCOUNTER — Encounter: Payer: Self-pay | Admitting: Gastroenterology

## 2017-01-18 VITALS — BP 108/72 | HR 82 | Temp 96.8°F | Ht 62.0 in | Wt 102.2 lb

## 2017-01-18 DIAGNOSIS — K219 Gastro-esophageal reflux disease without esophagitis: Secondary | ICD-10-CM

## 2017-01-18 DIAGNOSIS — R1013 Epigastric pain: Secondary | ICD-10-CM | POA: Diagnosis not present

## 2017-01-18 MED ORDER — LANSOPRAZOLE 30 MG PO CPDR: 30.0000 mg | DELAYED_RELEASE_CAPSULE | Freq: Every day | ORAL | 11 refills | Status: DC

## 2017-01-18 NOTE — Assessment & Plan Note (Addendum)
Typical reflux well controlled. Mostly complains of postprandial bloating and early satiety. Chronic symptoms, possibly worse in the last couple months. Notes increased headache and use of Aleve. Prior history of peptic ulcer disease. Last EGD with gastritis and duodenitis. Patient wants to try Beano before doing anything. I am not optimistic that will help much with her postprandial early satiety. Discussed with patient that if she has no noted improvement in 2 weeks she should call and let me know. Would consider increasing PPI to twice a day short term versus endoscopy. Return to the office in 4 months.

## 2017-01-18 NOTE — Patient Instructions (Addendum)
1. Continue lansoprazole 30 mg, 30 minutes before breakfast. 2. Trial of Beano for bloating. If no significant response, please let me know. Would consider upper endoscopy versus increasing lansoprazole to twice daily as next step.

## 2017-01-18 NOTE — Progress Notes (Addendum)
REVIEWED-NO ADDITIONAL RECOMMENDATIONS.   Primary Care Physician: Barry Dienes, NP  Primary Gastroenterologist:  Barney Drain, MD   Chief Complaint  Patient presents with  . Medication Refill    HPI: [name] is a 46 y.o. female here for follow-up of GERD, dyspepsia. Last seen in March 2016.Patient is started having more headaches. She admits to having to use Aleve to get relief. Often takes during the night because she gets workup with headache. Overall heartburn is well controlled on lansoprazole once daily. She takes Zantac at bedtime. Her only complaint is significant bloating. Significant early satiety. She drinks 16 ounces of water first thing in the morning with her morning medications. Then she has no appetite for breakfast. She has 12 ounces of caffeine free Pepsi with crackers over the next 2 hours. She doesn't get hungry until dinner time and then can eat "a small plate of food". No vomiting. Has 1 bowel movement daily, soft to loose. No blood in the stool or melena. She wants to try Beano for her bloating. No specific medications caused bloating. Happens with liquids and food.  She had had some issues with hypernatremia in the setting of excessive water consumption. This has resolved since taking prednisone. States that she had been using a lot of salt with her diet because she couldn't taste for food on prednisone. No longer has to do that so she is not is thirsty and not drinking as much.  Has an appointment see her neurologist for the headaches, prior history of strokes.   Current Outpatient Prescriptions  Medication Sig Dispense Refill  . aspirin EC 81 MG tablet Take 81 mg by mouth daily.    . clopidogrel (PLAVIX) 75 MG tablet Take 1 tablet (75 mg total) by mouth daily. 30 tablet 0  . estradiol (ESTRACE) 2 MG tablet TAKE 1 TABLET BY MOUTH ONCE DAILY. 30 tablet 11  . Icosapent Ethyl (VASCEPA) 1 G CAPS Take 2 capsules by mouth 2 (two) times daily.    . lansoprazole  (PREVACID) 30 MG capsule TAKE (1) CAPSULE TWICE DAILY (TAKE ON AN EMPTY STOMACH AT LEAST 71M IN - UTES BEFORE MEALS). (Patient taking differently: TAKE (1) CAPSULE ONCE DAILY (TAKE ON AN EMPTY STOMACH AT LEAST 71M IN - UTES BEFORE MEALS).) 60 capsule 1  . levothyroxine (SYNTHROID, LEVOTHROID) 50 MCG tablet Take 1 tablet (50 mcg total) by mouth daily. 30 tablet 6  . Multiple Vitamin (MULTIVITAMIN) tablet Take 1 tablet by mouth daily.    . ranitidine (ZANTAC) 300 MG tablet TAKE ONE TABLET BY MOUTH AT BEDTIME. 30 tablet 3  . rosuvastatin (CRESTOR) 20 MG tablet Take 20 mg by mouth daily.     No current facility-administered medications for this visit.    Allergies  Allergen Reactions  . Pantoprazole Sodium Itching and Rash  . Penicillins Itching and Rash  . Sulfa Antibiotics Itching and Rash  . Demerol [Meperidine]     Red streaks on arm after coadministration of phenergan and demerol at time of EGD in 2012. Tolerated phenergan with subsequent EGD.     Past Medical History:  Diagnosis Date  . Bleeding ulcer   . Bronchitis    hx of  . 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 Cervical cancer ([place])   . Chronic headaches   . GERD (gastroesophageal reflux disease)   . Hyperlipidemia   . Pituitary tumor   . PUD (peptic ulcer disease)  NSAIDS   . Stenosis of right carotid artery   . TIA (transient ischemic attack)    Past Surgical History:  Procedure Laterality Date  . ABDOMINAL HYSTERECTOMY    . BLADDER SUSPENSION    . ESOPHAGOGASTRODUODENOSCOPY  02/2010   Dr. Barney Drain. Multiple duodenal ulcers, gastric erosions (bx negative for H.Pylori)  . ESOPHAGOGASTRODUODENOSCOPY N/A 06/19/2013   [facility]: 1. Earlene Plater /dyspepsia due to uncontrolled GERD, & NSAID gastritis/duodenitis.  . Multiple cyst removal     coccyx, left ring finger, pelvic area & throat/vocal cord and pilonidal cyst  . RADIOLOGY WITH ANESTHESIA  03/23/2012    Procedure: RADIOLOGY WITH ANESTHESIA;  Surgeon: Rob Hickman, MD;  Location: [facility];  Service: Radiology;  Laterality: N/A;  . TOTAL VAGINAL HYSTERECTOMY     Family History  Problem Relation Age of Onset  . Diabetes Mother   . COPD Mother   . Heart disease Father   . Cervical cancer Sister   . Colon cancer Neg Hx   . Colon polyps Neg Hx    Social History   Social History  . Marital status: Married    Spouse name: N/A  . Number of children: N/A  . Years of education: N/A   Occupational History  . unemployed Unemployed   Social History Main Topics  . Smoking status: Current Every Day Smoker    Packs/day: 0.50    Years: 25.00    Types: Cigarettes    Last attempt to quit: 01/12/2012  . Smokeless tobacco: Former Systems developer    Quit date: 03/09/2012     Comment: smoking cigarettes   6-8 daily  . Alcohol use 8.4 - 16.8 oz/week    14 - 28 Cans of beer per week     Comment: drinks 2-4 beers a day; occassional  . Drug use: No  . Sexual activity: Yes    Partners: Male    Birth control/ protection: Surgical     Comment: spouse   Other Topics Concern  . None   Social History Narrative  . None    ROS:  General: Negative for anorexia, weight loss, fever, chills, fatigue, weakness. ENT: Negative for hoarseness, difficulty swallowing , nasal congestion. CV: Negative for chest pain, angina, palpitations, dyspnea on exertion, peripheral edema.  Respiratory: Negative for dyspnea at rest, dyspnea on exertion, cough, sputum, wheezing.  GI: See history of present illness. GU:  Negative for dysuria, hematuria, urinary incontinence, urinary frequency, nocturnal urination.  Endo: Negative for unusual weight change.    Physical Examination:   BP 108/72   Pulse 82   Temp (!) 96.8 F (36 C) (Oral)   Ht 5\' 2"  (1.575 m)   Wt 102 lb 3.2 oz (46.4 kg)   BMI 18.69 kg/m   General: Well-nourished, well-developed in no acute distress.  Eyes: No icterus. Mouth: Oropharyngeal mucosa  moist and pink , no lesions erythema or exudate. Lungs: Clear to auscultation bilaterally.  Heart: Regular rate and rhythm, no murmurs rubs or gallops.  Abdomen: Bowel sounds are normal, nontender, nondistended, no hepatosplenomegaly or masses, no abdominal bruits or hernia , no rebound or guarding.   Extremities: No lower extremity edema. No clubbing or deformities. Neuro: Alert and oriented x 4   Skin: Warm and dry, no jaundice.   Psych: Alert and cooperative, normal mood and affect.  Lab Results  Component Value Date   CREATININE 0.68 02/05/2016   BUN 9 02/05/2016   NA 136 02/05/2016   K 3.9 02/05/2016   CL 99 (  L) 02/05/2016   CO2 27 02/05/2016   Lab Results  Component Value Date   ALT 21 02/05/2016   AST 35 02/05/2016   ALKPHOS 69 02/05/2016   BILITOT 0.6 02/05/2016   Lab Results  Component Value Date   WBC 15.2 (H) 03/17/2016   HGB 13.4 03/17/2016   HCT 39.9 03/17/2016   MCV 100.0 03/17/2016   PLT 294 03/17/2016

## 2017-01-19 NOTE — Progress Notes (Signed)
CC'ED TO PCP 

## 2017-02-01 ENCOUNTER — Telehealth: Payer: Self-pay | Admitting: Gastroenterology

## 2017-02-01 NOTE — Telephone Encounter (Signed)
Patient called and stated that her problems have not gotten any better since her last appt and was told that we may possibly need an ugi,  On bean-o for 3 weeks and it is not working.  [phone]

## 2017-02-02 ENCOUNTER — Other Ambulatory Visit: Payer: Self-pay

## 2017-02-02 ENCOUNTER — Encounter: Payer: Self-pay | Admitting: Gastroenterology

## 2017-02-02 DIAGNOSIS — R6881 Early satiety: Secondary | ICD-10-CM

## 2017-02-02 DIAGNOSIS — R140 Abdominal distension (gaseous): Secondary | ICD-10-CM

## 2017-02-02 DIAGNOSIS — R112 Nausea with vomiting, unspecified: Secondary | ICD-10-CM

## 2017-02-02 NOTE — Telephone Encounter (Signed)
Patient needs EGD in OR with [facility] for postprandial early satiety, vomiting, bloating.

## 2017-02-02 NOTE — Telephone Encounter (Signed)
Called and informed pt. EGD in OR with [facility] scheduled for 02/23/17 at 8:15am. Will mail instructions after pre-op appt is scheduled.

## 2017-02-02 NOTE — Telephone Encounter (Signed)
LMOM for a return call.  

## 2017-02-02 NOTE — Telephone Encounter (Signed)
Called and informed pt of pre-op appt 02/18/17 at 1:45pm. Letter mailed with procedure instructions.

## 2017-02-02 NOTE — Telephone Encounter (Signed)
Spoke with pt and she feels her symptoms are worsening and medications aren't working well. Pt is taking Prevacid and eating crackers right after med is taken. Pt states that she doesn't have a big appetite since she drinks a lot of water to take medications in the morning. Pt takes Bino right before her first bite of lunch/dinner. I explained to pt that she is suppose to take Prevacid 30 mins prior to eating Breakfast in the mornings.  Pt went out to eat on Friday night and ate a Stake, Mac and cheese and mashed potatoes. As soon as pt got home she vomited due to feeling to full which pt continues to feel full all throughout the day. Please advise

## 2017-02-08 ENCOUNTER — Other Ambulatory Visit (HOSPITAL_COMMUNITY): Payer: Self-pay | Admitting: Nurse Practitioner

## 2017-02-08 DIAGNOSIS — Z1231 Encounter for screening mammogram for malignant neoplasm of breast: Secondary | ICD-10-CM

## 2017-02-11 NOTE — Patient Instructions (Signed)
[name]  02/11/2017     @PREFPERIOPPHARMACY @   Your procedure is scheduled on  02/23/2017   Report to [facility] at  645  A.M.  Call this number if you have problems the morning of surgery:  [phone]   Remember:  Do not eat food or drink liquids after midnight.  Take these medicines the morning of surgery with A SIP OF WATER  Prevacid, synthroid.   Do not wear jewelry, make-up or nail polish.  Do not wear lotions, powders, or perfumes, or deoderant.  Do not shave 48 hours prior to surgery.  Men may shave face and neck.  Do not bring valuables to the hospital.  [facility] is not responsible for any belongings or valuables.  Contacts, dentures or bridgework may not be worn into surgery.  Leave your suitcase in the car.  After surgery it may be brought to your room.  For patients admitted to the hospital, discharge time will be determined by your treatment team.  Patients discharged the day of surgery will not be allowed to drive home.   Name and phone number of your driver:   family Special instructions:  Follow the diet instructions given to you by Dr Nona Dell office.  Please read over the following fact sheets that you were given. Anesthesia Post-op Instructions and Care and Recovery After Surgery       Esophagogastroduodenoscopy Esophagogastroduodenoscopy (EGD) is a procedure to examine the lining of the esophagus, stomach, and first part of the small intestine (duodenum). This procedure is done to check for problems such as inflammation, bleeding, ulcers, or growths. During this procedure, a long, flexible, lighted tube with a camera attached (endoscope) is inserted down the throat. Tell a health care provider about:  Any allergies you have.  All medicines you are taking, including vitamins, herbs, eye drops, creams, and over-the-counter medicines.  Any problems you or family members have had with anesthetic medicines.  Any blood disorders  you have.  Any surgeries you have had.  Any medical conditions you have.  Whether you are pregnant or may be pregnant. What are the risks? Generally, this is a safe procedure. However, problems may occur, including:  Infection.  Bleeding.  A tear (perforation) in the esophagus, stomach, or duodenum.  Trouble breathing.  Excessive sweating.  Spasms of the larynx.  A slowed heartbeat.  Low blood pressure.  What happens before the procedure?  Follow instructions from your health care provider about eating or drinking restrictions.  Ask your health care provider about: ? Changing or stopping your regular medicines. This is especially important if you are taking diabetes medicines or blood thinners. ? Taking medicines such as aspirin and ibuprofen. These medicines can thin your blood. Do not take these medicines before your procedure if your health care provider instructs you not to.  Plan to have someone take you home after the procedure.  If you wear dentures, be ready to remove them before the procedure. What happens during the procedure?  To reduce your risk of infection, your health care team will wash or sanitize their hands.  An IV tube will be put in a vein in your hand or arm. You will get medicines and fluids through this tube.  You will be given one or more of the following: ? A medicine to help you relax (sedative). ? A medicine to numb the area (local anesthetic). This medicine may be sprayed into your  throat. It will make you feel more comfortable and keep you from gagging or coughing during the procedure. ? A medicine for pain.  A mouth guard may be placed in your mouth to protect your teeth and to keep you from biting on the endoscope.  You will be asked to lie on your left side.  The endoscope will be lowered down your throat into your esophagus, stomach, and duodenum.  Air will be put into the endoscope. This will help your health care provider see  better.  The lining of your esophagus, stomach, and duodenum will be examined.  Your health care provider may: ? Take a tissue sample so it can be looked at in a lab (biopsy). ? Remove growths. ? Remove objects (foreign bodies) that are stuck. ? Treat any bleeding with medicines or other devices that stop tissue from bleeding. ? Widen (dilate) or stretch narrowed areas of your esophagus and stomach.  The endoscope will be taken out. The procedure may vary among health care providers and hospitals. What happens after the procedure?  Your blood pressure, heart rate, breathing rate, and blood oxygen level will be monitored often until the medicines you were given have worn off.  Do not eat or drink anything until the numbing medicine has worn off and your gag reflex has returned. This information is not intended to replace advice given to you by your health care provider. Make sure you discuss any questions you have with your health care provider. Document Released: 07/31/2004 Document Revised: 09/05/2015 Document Reviewed: 02/21/2015 Elsevier Interactive Patient Education  2018 [employer]. Esophagogastroduodenoscopy, Care After Refer to this sheet in the next few weeks. These instructions provide you with information about caring for yourself after your procedure. Your health care provider may also give you more specific instructions. Your treatment has been planned according to current medical practices, but problems sometimes occur. Call your health care provider if you have any problems or questions after your procedure. What can I expect after the procedure? After the procedure, it is common to have:  A sore throat.  Nausea.  Bloating.  Dizziness.  Fatigue.  Follow these instructions at home:  Do not eat or drink anything until the numbing medicine (local anesthetic) has worn off and your gag reflex has returned. You will know that the local anesthetic has worn off when you  can swallow comfortably.  Do not drive for 24 hours if you received a medicine to help you relax (sedative).  If your health care provider took a tissue sample for testing during the procedure, make sure to get your test results. This is your responsibility. Ask your health care provider or the department performing the test when your results will be ready.  Keep all follow-up visits as told by your health care provider. This is important. Contact a health care provider if:  You cannot stop coughing.  You are not urinating.  You are urinating less than usual. Get help right away if:  You have trouble swallowing.  You cannot eat or drink.  You have throat or chest pain that gets worse.  You are dizzy or light-headed.  You faint.  You have nausea or vomiting.  You have chills.  You have a fever.  You have severe abdominal pain.  You have black, tarry, or bloody stools. This information is not intended to replace advice given to you by your health care provider. Make sure you discuss any questions you have with your health care provider.  Document Released: 03/16/2012 Document Revised: 09/05/2015 Document Reviewed: 02/21/2015 Elsevier Interactive Patient Education  2018 [place] Anesthesia is a term that refers to techniques, procedures, and medicines that help a person stay safe and comfortable during a medical procedure. Monitored anesthesia care, or sedation, is one type of anesthesia. Your anesthesia specialist may recommend sedation if you will be having a procedure that does not require you to be unconscious, such as:  Cataract surgery.  A dental procedure.  A biopsy.  A colonoscopy.  During the procedure, you may receive a medicine to help you relax (sedative). There are three levels of sedation:  Mild sedation. At this level, you may feel awake and relaxed. You will be able to follow directions.  Moderate sedation. At this  level, you will be sleepy. You may not remember the procedure.  Deep sedation. At this level, you will be asleep. You will not remember the procedure.  The more medicine you are given, the deeper your level of sedation will be. Depending on how you respond to the procedure, the anesthesia specialist may change your level of sedation or the type of anesthesia to fit your needs. An anesthesia specialist will monitor you closely during the procedure. Let your health care provider know about:  Any allergies you have.  All medicines you are taking, including vitamins, herbs, eye drops, creams, and over-the-counter medicines.  Any use of steroids (by mouth or as a cream).  Any problems you or family members have had with sedatives and anesthetic medicines.  Any blood disorders you have.  Any surgeries you have had.  Any medical conditions you have, such as sleep apnea.  Whether you are pregnant or may be pregnant.  Any use of cigarettes, alcohol, or street drugs. What are the risks? Generally, this is a safe procedure. However, problems may occur, including:  Getting too much medicine (oversedation).  Nausea.  Allergic reaction to medicines.  Trouble breathing. If this happens, a breathing tube may be used to help with breathing. It will be removed when you are awake and breathing on your own.  Heart trouble.  Lung trouble.  Before the procedure Staying hydrated Follow instructions from your health care provider about hydration, which may include:  Up to 2 hours before the procedure - you may continue to drink clear liquids, such as water, clear fruit juice, black coffee, and plain tea.  Eating and drinking restrictions Follow instructions from your health care provider about eating and drinking, which may include:  8 hours before the procedure - stop eating heavy meals or foods such as meat, fried foods, or fatty foods.  6 hours before the procedure - stop eating light  meals or foods, such as toast or cereal.  6 hours before the procedure - stop drinking milk or drinks that contain milk.  2 hours before the procedure - stop drinking clear liquids.  Medicines Ask your health care provider about:  Changing or stopping your regular medicines. This is especially important if you are taking diabetes medicines or blood thinners.  Taking medicines such as aspirin and ibuprofen. These medicines can thin your blood. Do not take these medicines before your procedure if your health care provider instructs you not to.  Tests and exams  You will have a physical exam.  You may have blood tests done to show: ? How well your kidneys and liver are working. ? How well your blood can clot.  General instructions  Plan to have  someone take you home from the hospital or clinic.  If you will be going home right after the procedure, plan to have someone with you for 24 hours.  What happens during the procedure?  Your blood pressure, heart rate, breathing, level of pain and overall condition will be monitored.  An IV tube will be inserted into one of your veins.  Your anesthesia specialist will give you medicines as needed to keep you comfortable during the procedure. This may mean changing the level of sedation.  The procedure will be performed. After the procedure  Your blood pressure, heart rate, breathing rate, and blood oxygen level will be monitored until the medicines you were given have worn off.  Do not drive for 24 hours if you received a sedative.  You may: ? Feel sleepy, clumsy, or nauseous. ? Feel forgetful about what happened after the procedure. ? Have a sore throat if you had a breathing tube during the procedure. ? Vomit. This information is not intended to replace advice given to you by your health care provider. Make sure you discuss any questions you have with your health care provider. Document Released: 12/24/2004 Document Revised:  09/06/2015 Document Reviewed: 07/21/2015 Elsevier Interactive Patient Education  2018 [place], Care After These instructions provide you with information about caring for yourself after your procedure. Your health care provider may also give you more specific instructions. Your treatment has been planned according to current medical practices, but problems sometimes occur. Call your health care provider if you have any problems or questions after your procedure. What can I expect after the procedure? After your procedure, it is common to:  Feel sleepy for several hours.  Feel clumsy and have poor balance for several hours.  Feel forgetful about what happened after the procedure.  Have poor judgment for several hours.  Feel nauseous or vomit.  Have a sore throat if you had a breathing tube during the procedure.  Follow these instructions at home: For at least 24 hours after the procedure:   Do not: ? Participate in activities in which you could fall or become injured. ? Drive. ? Use heavy machinery. ? Drink alcohol. ? Take sleeping pills or medicines that cause drowsiness. ? Make important decisions or sign legal documents. ? Take care of children on your own.  Rest. Eating and drinking  Follow the diet that is recommended by your health care provider.  If you vomit, drink water, juice, or soup when you can drink without vomiting.  Make sure you have little or no nausea before eating solid foods. General instructions  Have a responsible adult stay with you until you are awake and alert.  Take over-the-counter and prescription medicines only as told by your health care provider.  If you smoke, do not smoke without supervision.  Keep all follow-up visits as told by your health care provider. This is important. Contact a health care provider if:  You keep feeling nauseous or you keep vomiting.  You feel light-headed.  You develop a  rash.  You have a fever. Get help right away if:  You have trouble breathing. This information is not intended to replace advice given to you by your health care provider. Make sure you discuss any questions you have with your health care provider. Document Released: 07/21/2015 Document Revised: 11/20/2015 Document Reviewed: 07/21/2015 Elsevier Interactive Patient Education  [employer].

## 2017-02-18 ENCOUNTER — Encounter (HOSPITAL_COMMUNITY)
Admission: RE | Admit: 2017-02-18 | Discharge: 2017-02-18 | Disposition: A | Discharge status: Home / Self Care | Payer: BLUE CROSS/BLUE SHIELD | Source: Ambulatory Visit | Attending: Gastroenterology | Admitting: Gastroenterology

## 2017-02-18 ENCOUNTER — Other Ambulatory Visit: Payer: Self-pay

## 2017-02-18 ENCOUNTER — Encounter (HOSPITAL_COMMUNITY): Payer: Self-pay

## 2017-02-18 DIAGNOSIS — Z01810 Encounter for preprocedural cardiovascular examination: Secondary | ICD-10-CM | POA: Insufficient documentation

## 2017-02-18 DIAGNOSIS — Z01818 Encounter for other preprocedural examination: Secondary | ICD-10-CM | POA: Diagnosis not present

## 2017-02-18 DIAGNOSIS — K2970 Gastritis, unspecified, without bleeding: Secondary | ICD-10-CM | POA: Diagnosis not present

## 2017-02-18 HISTORY — DX: Hypothyroidism, unspecified: E03.9

## 2017-02-18 HISTORY — DX: Cerebral infarction, unspecified: I63.9

## 2017-02-18 LAB — BASIC METABOLIC PANEL
Anion gap: 8 (ref 5–15)
BUN: 6 mg/dL (ref 6–20)
CALCIUM: 9 mg/dL (ref 8.9–10.3)
CO2: 27 mmol/L (ref 22–32)
CREATININE: 0.59 mg/dL (ref 0.44–1.00)
Chloride: 101 mmol/L (ref 101–111)
GFR calc non Af Amer: >60 mL/min (ref >60)
Glucose, Bld: 64 mg/dL — ABNORMAL LOW (ref 65–99)
Potassium: 3.7 mmol/L (ref 3.5–5.1)
SODIUM: 136 mmol/L (ref 135–145)

## 2017-02-18 LAB — CBC WITH DIFFERENTIAL/PLATELET
BASOS PCT: 1 %
Basophils Absolute: 0.1 10*3/uL (ref 0.0–0.1)
EOS ABS: 0.6 10*3/uL (ref 0.0–0.7)
Eosinophils Relative: 5 %
HCT: 35.4 % — ABNORMAL LOW (ref 36.0–46.0)
Hemoglobin: 11.9 g/dL — ABNORMAL LOW (ref 12.0–15.0)
Lymphocytes Relative: 45 %
Lymphs Abs: 5.5 10*3/uL — ABNORMAL HIGH (ref 0.7–4.0)
MCH: 31.3 pg (ref 26.0–34.0)
MCHC: 33.6 g/dL (ref 30.0–36.0)
MCV: 93.2 fL (ref 78.0–100.0)
MONO ABS: 1 10*3/uL (ref 0.1–1.0)
MONOS PCT: 8 %
Neutro Abs: 5 10*3/uL (ref 1.7–7.7)
Neutrophils Relative %: 41 %
Platelets: 401 10*3/uL — ABNORMAL HIGH (ref 150–400)
RBC: 3.8 MIL/uL — ABNORMAL LOW (ref 3.87–5.11)
RDW: 12.7 % (ref 11.5–15.5)
WBC: 12.1 10*3/uL — ABNORMAL HIGH (ref 4.0–10.5)

## 2017-02-22 ENCOUNTER — Ambulatory Visit: Payer: BLUE CROSS/BLUE SHIELD | Admitting: Neurology

## 2017-02-22 ENCOUNTER — Encounter: Payer: Self-pay | Admitting: Neurology

## 2017-02-22 VITALS — BP 100/65 | HR 97 | Ht 62.0 in | Wt 105.0 lb

## 2017-02-22 DIAGNOSIS — G459 Transient cerebral ischemic attack, unspecified: Secondary | ICD-10-CM

## 2017-02-22 DIAGNOSIS — G43019 Migraine without aura, intractable, without status migrainosus: Secondary | ICD-10-CM | POA: Diagnosis not present

## 2017-02-22 HISTORY — DX: Migraine without aura, intractable, without status migrainosus: G43.019

## 2017-02-22 MED ORDER — TOPIRAMATE 25 MG PO TABS
ORAL_TABLET | ORAL | 3 refills | Status: DC
Start: 2017-02-22 — End: 2017-06-10

## 2017-02-22 NOTE — Patient Instructions (Signed)
   We will get CTA of the head and neck to look at the blood circulation of the head.  We will start Topamax for the headache.  Topamax (topiramate) is a seizure medication that has an FDA approval for seizures and for migraine headache. Potential side effects of this medication include weight loss, cognitive slowing, tingling in the fingers and toes, and carbonated drinks will taste bad. If any significant side effects are noted on this drug, please contact our office.

## 2017-02-22 NOTE — Progress Notes (Signed)
Reason for visit: Headaches  Referring physician: Dr. Sula Dixon is a 46 y.o. female  History of present illness:  [name] is a 46 year old left-handed white female with a history of ongoing tobacco abuse and history of significant cerebrovascular disease.  The patient has known bilateral internal carotid artery occlusion, she has had a TIA and strokelike events in the past.  The patient does have a prior history of headaches that had improved significantly until around August 2018.  The headaches have come back again and are occurring 4 or 5 times a month.  The headaches may wake her up from sleep, she generally will take Aleve usually with good improvement but this medication does not help all the time.  The patient denies any nausea or vomiting with the headache, she does have photophobia without phonophobia.  She may see spots in front of her eyes with a headache.  The patient has also had 2 recent TIA type events both associated with nausea and vomiting.  About 5 minutes after she vomited she noted episodes of right arm weakness that will last 5 or 10 minutes and then clear.  The patient last had an event about 1 month ago.  The patient denies any persistent numbness or weakness of the face, arms, or legs.  She denies any difficulty with balance or difficulty controlling the bowels of the bladder.  The patient again continues to smoke cigarettes.  She comes to the office today for an evaluation.  Past Medical History:  Diagnosis Date  . Bleeding ulcer   . Bronchitis    hx of  . Carotid artery disease ([place])    bilateral internal carotid artery occlusion by duplex ultrasound  . Cervical cancer ([facility])    cervical  . Chronic headaches   . GERD (gastroesophageal reflux disease)   . Hyperlipidemia   . Hypothyroidism   . Pituitary tumor   . PUD (peptic ulcer disease)    NSAIDS   . Stenosis of right carotid artery   . Stroke ([place])    mini-stroke; memory deficits, slight  .  TIA (transient ischemic attack)     Past Surgical History:  Procedure Laterality Date  . ABDOMINAL HYSTERECTOMY    . BLADDER SUSPENSION    . ESOPHAGOGASTRODUODENOSCOPY  02/2010   Dr. Barney Drain. Multiple duodenal ulcers, gastric erosions (bx negative for H.Pylori)  . Multiple cyst removal     coccyx, left ring finger, pelvic area & throat/vocal cord and pilonidal cyst  . TOTAL VAGINAL HYSTERECTOMY      Family History  Problem Relation Age of Onset  . Diabetes Mother   . COPD Mother   . Heart disease Father   . Cervical cancer Sister   . Colon cancer Neg Hx   . Colon polyps Neg Hx     Social history:  reports that she has been smoking cigarettes.  She has a 25.00 pack-year smoking history. she has never used smokeless tobacco. She reports that she drinks about 8.4 - 16.8 oz of alcohol per week. She reports that she does not use drugs.  Medications:  Prior to Admission medications   Medication Sig Start Date End Date Taking? Authorizing Provider  Alpha-D-Galactosidase Satira Mccallum) TABS Take 1 tablet by mouth as needed (gas).   Yes [provider]  aspirin EC 81 MG tablet Take 81 mg by mouth daily.   Yes [provider]  clopidogrel (PLAVIX) 75 MG tablet Take 1 tablet (75 mg total) by  mouth daily. 06/19/13  Yes Fields, Marga Melnick, MD  estradiol (ESTRACE) 2 MG tablet TAKE 1 TABLET BY MOUTH ONCE DAILY. 11/25/16  Yes Florian Buff, MD  Icosapent Ethyl (VASCEPA) 1 G CAPS Take 2 g by mouth 2 (two) times daily.    Yes [provider]  lansoprazole (PREVACID) 30 MG capsule Take 1 capsule (30 mg total) by mouth daily before breakfast. 01/18/17  Yes Mahala Menghini, PA-C  levothyroxine (SYNTHROID, LEVOTHROID) 50 MCG tablet Take 1 tablet (50 mcg total) by mouth daily. 12/31/16  Yes Cassandria Anger, MD  Multiple Vitamin (MULTIVITAMIN) tablet Take 1 tablet by mouth daily.   Yes [provider]  naproxen sodium (ALEVE) 220 MG tablet Take 220 mg by mouth daily as  needed (pain).   Yes [provider]  ranitidine (ZANTAC) 300 MG tablet TAKE ONE TABLET BY MOUTH AT BEDTIME. 09/21/16  Yes Annitta Needs, NP  rosuvastatin (CRESTOR) 20 MG tablet Take 20 mg by mouth daily.   Yes [provider]      Allergies  Allergen Reactions  . Pantoprazole Sodium Itching and Rash  . Penicillins Itching and Rash    Has patient had a PCN reaction causing immediate rash, facial/tongue/throat swelling, SOB or lightheadedness with hypotension: Yes Has patient had a PCN reaction causing severe rash involving mucus membranes or skin necrosis: No Has patient had a PCN reaction that required hospitalization: No Has patient had a PCN reaction occurring within the last 10 years: No If all of the above answers are "NO", then may proceed with Cephalosporin use.   . Sulfa Antibiotics Itching and Rash  . Codeine Nausea And Vomiting  . Demerol [Meperidine]     Red streaks on arm after coadministration of phenergan and demerol at time of EGD in 2012. Tolerated phenergan with subsequent EGD.    ROS:  Out of a complete 14 system review of symptoms, the patient complains only of the following symptoms, and all other reviewed systems are negative.  Easy bruising, easy bleeding Headache Allergies Birthmarks, moles  Blood pressure 100/65, pulse 97, height 5\' 2"  (1.575 m), weight 105 lb (47.6 kg), SpO2 98 %.  Physical Exam  General: The patient is alert and cooperative at the time of the examination.  Eyes: Pupils are equal, round, and reactive to light. Discs are flat bilaterally.  Neck: The neck is supple, no carotid bruits are noted.  Respiratory: The respiratory examination is clear.  Cardiovascular: The cardiovascular examination reveals a regular rate and rhythm, no obvious murmurs or rubs are noted.  Skin: Extremities are without significant edema.  Neurologic Exam  Mental status: The patient is alert and oriented x 3 at the time of the  examination. The patient has apparent normal recent and remote memory, with an apparently normal attention span and concentration ability.  Cranial nerves: Facial symmetry is present. There is good sensation of the face to pinprick and soft touch bilaterally. The strength of the facial muscles and the muscles to head turning and shoulder shrug are normal bilaterally. Speech is well enunciated, no aphasia or dysarthria is noted. Extraocular movements are full. Visual fields are full. The tongue is midline, and the patient has symmetric elevation of the soft palate. No obvious hearing deficits are noted.  Motor: The motor testing reveals 5 over 5 strength of all 4 extremities. Good symmetric motor tone is noted throughout.  Sensory: Sensory testing is intact to pinprick, soft touch, vibration sensation, and position sense on all 4  extremities. No evidence of extinction is noted.  Coordination: Cerebellar testing reveals good finger-nose-finger and heel-to-shin bilaterally.  Gait and station: Gait is normal. Tandem gait is normal. Romberg is negative. No drift is seen.  Reflexes: Deep tendon reflexes are symmetric and normal bilaterally. Toes are downgoing bilaterally.   Assessment/Plan:  1.  Common migraine headache  2.  Cerebrovascular disease, bilateral carotid artery occlusion  3.  Recent TIA events  The patient is having headaches with some regularity.  We will start Topamax, working up to 50 mg at night.  Given the recent TIA events, she will have CT angiogram evaluation of the head and neck.  The episodes that have occurred have been associated with nausea and vomiting, it is possible that a slight drop in blood pressure around these events may have resulted in reduced perfusion of the brain with a subsequent TIA.  The patient will follow-up in 3 months.  Tonya Alexanders MD 02/22/2017 3:16 PM  [facility] [address] [place], [place]  [address]  Phone [phone] Fax [phone]

## 2017-02-23 ENCOUNTER — Encounter (HOSPITAL_COMMUNITY)
Admission: RE | Disposition: A | Discharge status: Home / Self Care | Payer: Self-pay | Source: Ambulatory Visit | Attending: Gastroenterology

## 2017-02-23 ENCOUNTER — Ambulatory Visit (HOSPITAL_COMMUNITY)
Admission: RE | Admit: 2017-02-23 | Discharge: 2017-02-23 | Disposition: A | Discharge status: Home / Self Care | Payer: BLUE CROSS/BLUE SHIELD | Source: Ambulatory Visit | Attending: Gastroenterology | Admitting: Gastroenterology

## 2017-02-23 ENCOUNTER — Encounter (HOSPITAL_COMMUNITY): Payer: Self-pay | Admitting: *Deleted

## 2017-02-23 ENCOUNTER — Telehealth: Payer: Self-pay | Admitting: Neurology

## 2017-02-23 ENCOUNTER — Ambulatory Visit (HOSPITAL_COMMUNITY): Payer: BLUE CROSS/BLUE SHIELD | Admitting: Anesthesiology

## 2017-02-23 DIAGNOSIS — R140 Abdominal distension (gaseous): Secondary | ICD-10-CM | POA: Diagnosis not present

## 2017-02-23 DIAGNOSIS — F17210 Nicotine dependence, cigarettes, uncomplicated: Secondary | ICD-10-CM | POA: Diagnosis not present

## 2017-02-23 DIAGNOSIS — Z79899 Other long term (current) drug therapy: Secondary | ICD-10-CM | POA: Insufficient documentation

## 2017-02-23 DIAGNOSIS — I6521 Occlusion and stenosis of right carotid artery: Secondary | ICD-10-CM | POA: Diagnosis not present

## 2017-02-23 DIAGNOSIS — I2510 Atherosclerotic heart disease of native coronary artery without angina pectoris: Secondary | ICD-10-CM | POA: Insufficient documentation

## 2017-02-23 DIAGNOSIS — E039 Hypothyroidism, unspecified: Secondary | ICD-10-CM | POA: Diagnosis not present

## 2017-02-23 DIAGNOSIS — R6881 Early satiety: Secondary | ICD-10-CM

## 2017-02-23 DIAGNOSIS — K219 Gastro-esophageal reflux disease without esophagitis: Secondary | ICD-10-CM | POA: Diagnosis not present

## 2017-02-23 DIAGNOSIS — R197 Diarrhea, unspecified: Secondary | ICD-10-CM

## 2017-02-23 DIAGNOSIS — K2970 Gastritis, unspecified, without bleeding: Secondary | ICD-10-CM

## 2017-02-23 DIAGNOSIS — Z8673 Personal history of transient ischemic attack (TIA), and cerebral infarction without residual deficits: Secondary | ICD-10-CM | POA: Insufficient documentation

## 2017-02-23 DIAGNOSIS — E785 Hyperlipidemia, unspecified: Secondary | ICD-10-CM | POA: Diagnosis not present

## 2017-02-23 DIAGNOSIS — R112 Nausea with vomiting, unspecified: Secondary | ICD-10-CM

## 2017-02-23 DIAGNOSIS — Z8541 Personal history of malignant neoplasm of cervix uteri: Secondary | ICD-10-CM | POA: Diagnosis not present

## 2017-02-23 DIAGNOSIS — R1013 Epigastric pain: Secondary | ICD-10-CM | POA: Diagnosis present

## 2017-02-23 HISTORY — PX: BIOPSY: SHX5522

## 2017-02-23 HISTORY — PX: ESOPHAGOGASTRODUODENOSCOPY (EGD) WITH PROPOFOL: SHX5813

## 2017-02-23 LAB — GLUCOSE, CAPILLARY
Glucose-Capillary: 89 mg/dL (ref 65–99)
Glucose-Capillary: 101 mg/dL — ABNORMAL HIGH (ref 65–99)

## 2017-02-23 SURGERY — ESOPHAGOGASTRODUODENOSCOPY (EGD) WITH PROPOFOL
Anesthesia: Monitor Anesthesia Care

## 2017-02-23 MED ORDER — MIDAZOLAM HCL 2 MG/2ML IJ SOLN
1.0000 mg | INTRAMUSCULAR | Status: AC
  Administered 2017-02-23: 2 mg via INTRAVENOUS

## 2017-02-23 MED ORDER — LIDOCAINE HCL (CARDIAC) 20 MG/ML IV SOLN
INTRAVENOUS | Status: DC | PRN
  Administered 2017-02-23: 50 mg via INTRAVENOUS

## 2017-02-23 MED ORDER — FENTANYL CITRATE (PF) 100 MCG/2ML IJ SOLN
INTRAMUSCULAR | Status: AC
  Filled 2017-02-23: qty 2

## 2017-02-23 MED ORDER — MIDAZOLAM HCL 2 MG/2ML IJ SOLN
INTRAMUSCULAR | Status: AC
  Filled 2017-02-23: qty 2

## 2017-02-23 MED ORDER — PROPOFOL 10 MG/ML IV BOLUS
INTRAVENOUS | Status: DC | PRN
  Administered 2017-02-23: 50 mg via INTRAVENOUS

## 2017-02-23 MED ORDER — CHLORHEXIDINE GLUCONATE CLOTH 2 % EX PADS: 6.0000 | MEDICATED_PAD | Freq: Once | CUTANEOUS | Status: DC

## 2017-02-23 MED ORDER — LIDOCAINE HCL (PF) 2 % IJ SOLN
INTRAMUSCULAR | Status: AC
  Filled 2017-02-23: qty 10

## 2017-02-23 MED ORDER — FENTANYL CITRATE (PF) 100 MCG/2ML IJ SOLN
25.0000 ug | Freq: Once | INTRAMUSCULAR | Status: AC
  Administered 2017-02-23: 25 ug via INTRAVENOUS

## 2017-02-23 MED ORDER — PROPOFOL 500 MG/50ML IV EMUL
INTRAVENOUS | Status: DC | PRN
  Administered 2017-02-23: 150 ug/kg/min via INTRAVENOUS

## 2017-02-23 MED ORDER — LIDOCAINE VISCOUS 2 % MT SOLN
OROMUCOSAL | Status: AC
  Filled 2017-02-23: qty 15

## 2017-02-23 MED ORDER — PROPOFOL 10 MG/ML IV BOLUS
INTRAVENOUS | Status: AC
  Filled 2017-02-23: qty 20

## 2017-02-23 MED ORDER — SIMETHICONE 40 MG/0.6ML PO SUSP
ORAL | Status: DC | PRN
  Administered 2017-02-23: 100 mL

## 2017-02-23 MED ORDER — LIDOCAINE VISCOUS 2 % MT SOLN
6.0000 mL | Freq: Once | OROMUCOSAL | Status: AC
  Administered 2017-02-23: 6 mL via OROMUCOSAL

## 2017-02-23 MED ORDER — LACTATED RINGERS IV SOLN
INTRAVENOUS | Status: DC
  Administered 2017-02-23: 08:00:00 via INTRAVENOUS

## 2017-02-23 NOTE — Telephone Encounter (Signed)
Patient is returning your call.  

## 2017-02-23 NOTE — Discharge Instructions (Signed)
You have mild gastritis due to aspirin and naproxen. YOUR SMALL BOWEL LOOKED NORMAL. I biopsied your small bowel.    DRINK WATER TO KEEP YOUR URINE LIGHT YELLOW.  FOLLOW A DAIRY FREE/LOW FAT DIET. MEATS SHOULD BE BAKED, BROILED, OR BOILED. AVOID FRIED FOODS. SEE INFO BELOW ON A DAIRY FREE DIET.  IF YOU CONSUME DAIRY, TAKE LACTASE 2-3 PILLS WITH MEALS UP TO THREE OR FOUR TIMES A DAY.   CONTINUE PREVACID.  TAKE 30 MINUTES PRIOR TO YOUR FIRST MEAL EVERY DAY.  ZANTAC HELPS MOST WHEN USED AS NEEDED.   YOUR BIOPSY RESULTS WILL BE AVAILABLE IN MY CHART AFTER NOV 17 AND MY OFFICE WILL CONTACT YOU IN 10-14 DAYS WITH YOUR RESULTS.    PLEASE CALL IN ONE MONTH IF SYMPTOMS ARE NOT IMPROVED.   FOLLOW UP IN 4 MOS.  UPPER ENDOSCOPY AFTER CARE Read the instructions outlined below and refer to this sheet in the next week. These discharge instructions provide you with general information on caring for yourself after you leave the hospital. While your treatment has been planned according to the most current medical practices available, unavoidable complications occasionally occur. If you have any problems or questions after discharge, call DR. Nickalous Stingley, [phone].  ACTIVITY  You may resume your regular activity, but move at a slower pace for the next 24 hours.   Take frequent rest periods for the next 24 hours.   Walking will help get rid of the air and reduce the bloated feeling in your belly (abdomen).   No driving for 24 hours (because of the medicine (anesthesia) used during the test).   You may shower.   Do not sign any important legal documents or operate any machinery for 24 hours (because of the anesthesia used during the test).    NUTRITION  Drink plenty of fluids.   You may resume your normal diet as instructed by your doctor.   Begin with a light meal and progress to your normal diet. Heavy or fried foods are harder to digest and may make you feel sick to your stomach  (nauseated).   Avoid alcoholic beverages for 24 hours or as instructed.    MEDICATIONS  You may resume your normal medications.   WHAT YOU CAN EXPECT TODAY  Some feelings of bloating in the abdomen.   Passage of more gas than usual.    IF YOU HAD A BIOPSY TAKEN DURING THE UPPER ENDOSCOPY:  Eat a soft diet IF YOU HAVE NAUSEA, BLOATING, ABDOMINAL PAIN, OR VOMITING.    FINDING OUT THE RESULTS OF YOUR TEST Not all test results are available during your visit. DR. Oneida Alar WILL CALL YOU WITHIN 14 DAYS OF YOUR PROCEDUE WITH YOUR RESULTS. Do not assume everything is normal if you have not heard from DR. Alayiah Fontes, CALL HER OFFICE AT [phone].  SEEK IMMEDIATE MEDICAL ATTENTION AND CALL THE OFFICE: [phone] IF:  You have more than a spotting of blood in your stool.   Your belly is swollen (abdominal distention).   You are nauseated or vomiting.   You have a temperature over 101F.   You have abdominal pain or discomfort that is severe or gets worse throughout the day.   Gastritis  Gastritis is an inflammation (the body's way of reacting to injury and/or infection) of the stomach. It is often caused by viral or bacterial (germ) infections. It can also be caused BY ASPIRIN, BC/GOODY POWDER'S, (IBUPROFEN) MOTRIN, OR ALEVE (NAPROXEN), chemicals (including alcohol), SPICY FOODS, and medications. This illness may  be associated with generalized malaise (feeling tired, not well), UPPER ABDOMINAL STOMACH cramps, and fever. One common bacterial cause of gastritis is an organism known as H. Pylori. This can be treated with antibiotics.     Lactose Free Diet Lactose is a carbohydrate that is found mainly in milk and milk products, as well as in foods with added milk or whey. Lactose must be digested by the enzyme in order to be used by the body. Lactose intolerance occurs when there is a shortage of lactase. When your body is not able to digest lactose, you may feel sick to your stomach  (nausea), bloating, cramping, gas and diarrhea.  There are many dairy products that may be tolerated better than milk by some people:  The use of cultured dairy products such as yogurt, buttermilk, cottage cheese, and sweet acidophilus milk (Kefir) for lactase-deficient individuals is usually well tolerated. This is because the healthy bacteria help digest lactose.   Lactose-hydrolyzed milk (Lactaid) contains 40-90% less lactose than milk and may also be well tolerated.    SPECIAL NOTES  Lactose is a carbohydrates. The major food source is dairy products. Reading food labels is important. Many products contain lactose even when they are not made from milk. Look for the following words: whey, milk solids, dry milk solids, nonfat dry milk powder. Typical sources of lactose other than dairy products include breads, candies, cold cuts, prepared and processed foods, and commercial sauces and gravies.   All foods must be prepared without milk, cream, or other dairy foods.   Soy milk and lactose-free supplements (LACTASE) may be used as an alternative to milk.   FOOD GROUP ALLOWED/RECOMMENDED AVOID/USE SPARINGLY  BREADS / STARCHES 4 servings or more* Breads and rolls made without milk. [place], [place], or [place] bread. Breads and rolls that contain milk. Prepared mixes such as muffins, biscuits, waffles, pancakes. Sweet rolls, donuts, [place] toast (if made with milk or lactose).  Crackers: Soda crackers, graham crackers. Any crackers prepared without lactose. Zwieback crackers, corn curls, or any that contain lactose.  Cereals: Cooked or dry cereals prepared without lactose (read labels). Cooked or dry cereals prepared with lactose (read labels). Total, Cocoa Krispies. Special K.  Potatoes / Pasta / Rice: Any prepared without milk or lactose. Popcorn. Instant potatoes, frozen [place] fries, scalloped or au gratin potatoes.  VEGETABLES 2 servings or more Fresh, frozen, and canned vegetables.  Creamed or breaded vegetables. Vegetables in a cheese sauce or with lactose-containing margarines.  FRUIT 2 servings or more All fresh, canned, or frozen fruits that are not processed with lactose. Any canned or frozen fruits processed with lactose.  MEAT & SUBSTITUTES 2 servings or more (4 to 6 oz. total per day) Plain beef, chicken, fish, [place], lamb, veal, pork, or ham. Kosher prepared meat products. Strained or junior meats that do not contain milk. Eggs, soy meat substitutes, nuts. Scrambled eggs, omelets, and souffles that contain milk. Creamed or breaded meat, fish, or fowl. Sausage products such as wieners, liver sausage, or cold cuts that contain milk solids. Cheese, cottage cheese, or cheese spreads.  MILK None. (See BEVERAGES for milk substitutes. See DESSERTS for ice cream and frozen desserts.) Milk (whole, 2%, skim, or chocolate). Evaporated, powdered, or condensed milk; malted milk.  SOUPS & COMBINATION FOODS Bouillon, broth, vegetable soups, clear soups, consomms. Homemade soups made with allowed ingredients. Combination or prepared foods that do not contain milk or milk products (read labels). Cream soups, chowders, commercially prepared soups containing lactose. Macaroni and cheese,  pizza. Combination or prepared foods that contain milk or milk products.  DESSERTS & SWEETS In moderation Water and fruit ices; gelatin; angel food cake. Homemade cookies, pies, or cakes made from allowed ingredients. Pudding (if made with water or a milk substitute). Lactose-free tofu desserts. Sugar, honey, corn syrup, jam, jelly; marmalade; molasses (beet sugar); Pure sugar candy; marshmallows. Ice cream, ice milk, sherbet, custard, pudding, frozen yogurt. Commercial cake and cookie mixes. Desserts that contain chocolate. Pie crust made with milk-containing margarine; reduced-calorie desserts made with a sugar substitute that contains lactose. Toffee, peppermint, butterscotch, chocolate, caramels.    FATS & OILS In moderation Butter (as tolerated; contains very small amounts of lactose). Margarines and dressings that do not contain milk, Vegetable oils, shortening, Miracle Whip, mayonnaise, nondairy cream & whipped toppings without lactose or milk solids added (examples: Coffee Rich, Carnation Coffeemate, Rich's Whipped Topping, PolyRich). Berniece Salines. Margarines and salad dressings containing milk; cream, cream cheese; peanut butter with added milk solids, sour cream, chip dips, made with sour cream.  BEVERAGES Carbonated drinks; tea; coffee and freeze-dried coffee; some instant coffees (check labels). Fruit drinks; fruit and vegetable juice; Rice or Soy milk. Ovaltine, hot chocolate. Some cocoas; some instant coffees; instant iced teas; powdered fruit drinks (read labels).   CONDIMENTS / MISCELLANEOUS Soy sauce, carob powder, olives, gravy made with water, baker's cocoa, pickles, pure seasonings and spices, wine, pure monosodium glutamate, catsup, mustard. Some chewing gums, chocolate, some cocoas. Certain antibiotics and vitamin / mineral preparations. Spice blends if they contain milk products. MSG extender. Artificial sweeteners that contain lactose such as Equal (Nutra-Sweet) and Sweet 'n Low. Some nondairy creamers (read labels).   SAMPLE MENU*  Breakfast   Orange Juice.  Banana.   Bran flakes.   Nondairy Creamer.  [place] Bread (toasted).   Butter or milk-free margarine.   Coffee or tea.    Noon Meal   Chicken Breast.  Rice.   Green beans.   Butter or milk-free margarine.  Fresh melon.   Coffee or tea.    Evening Meal   Roast Beef.  Baked potato.   Butter or milk-free margarine.   Broccoli.   Lettuce salad with vinegar and oil dressing.  [employer]   Coffee or tea.

## 2017-02-23 NOTE — Addendum Note (Signed)
Addendum  created 02/23/17 0949 by Lyda Jester, CRNA   Charge Capture section accepted, Intraprocedure Event deleted

## 2017-02-23 NOTE — H&P (Addendum)
Primary Care Physician:  Barry Dienes, NP Primary Gastroenterologist:  Dr. Oneida Alar  Pre-Procedure History & Physical: HPI:  [name] is a 46 y.o. female here for DYSPEPSIA.  Past Medical History:  Diagnosis Date  . Bleeding ulcer   . Bronchitis    hx of  . Carotid artery disease ([place])    bilateral internal carotid artery occlusion by duplex ultrasound  . Cervical cancer ([facility])    cervical  . Chronic headaches   . Common migraine with intractable migraine 02/22/2017  . GERD (gastroesophageal reflux disease)   . Hyperlipidemia   . Hypothyroidism   . Pituitary tumor   . PUD (peptic ulcer disease)    NSAIDS   . Stenosis of right carotid artery   . Stroke ([place])    mini-stroke; memory deficits, slight  . TIA (transient ischemic attack)     Past Surgical History:  Procedure Laterality Date  . ABDOMINAL HYSTERECTOMY    . BLADDER SUSPENSION    . ESOPHAGOGASTRODUODENOSCOPY  02/2010   Dr. Barney Drain. Multiple duodenal ulcers, gastric erosions (bx negative for H.Pylori)  . Multiple cyst removal     coccyx, left ring finger, pelvic area & throat/vocal cord and pilonidal cyst  . TOTAL VAGINAL HYSTERECTOMY      Prior to Admission medications   Medication Sig Start Date End Date Taking? Authorizing Provider  Alpha-D-Galactosidase Satira Mccallum) TABS Take 1 tablet by mouth as needed (gas).   Yes [provider]  aspirin EC 81 MG tablet Take 81 mg by mouth daily.   Yes [provider]  clopidogrel (PLAVIX) 75 MG tablet Take 1 tablet (75 mg total) by mouth daily. 06/19/13  Yes Sevrin Sally, Marga Melnick, MD  estradiol (ESTRACE) 2 MG tablet TAKE 1 TABLET BY MOUTH ONCE DAILY. 11/25/16  Yes Florian Buff, MD  Icosapent Ethyl (VASCEPA) 1 G CAPS Take 2 g by mouth 2 (two) times daily.    Yes [provider]  lansoprazole (PREVACID) 30 MG capsule Take 1 capsule (30 mg total) by mouth daily before breakfast. 01/18/17  Yes Mahala Menghini, PA-C  levothyroxine (SYNTHROID, LEVOTHROID) 50  MCG tablet Take 1 tablet (50 mcg total) by mouth daily. 12/31/16  Yes Cassandria Anger, MD  Multiple Vitamin (MULTIVITAMIN) tablet Take 1 tablet by mouth daily.   Yes [provider]  naproxen sodium (ALEVE) 220 MG tablet Take 220 mg by mouth daily as needed (pain).   Yes [provider]  ranitidine (ZANTAC) 300 MG tablet TAKE ONE TABLET BY MOUTH AT BEDTIME. 09/21/16  Yes Annitta Needs, NP  rosuvastatin (CRESTOR) 20 MG tablet Take 20 mg by mouth daily.   Yes [provider]  topiramate (TOPAMAX) 25 MG tablet Take one tablet at night for one week, then take 2 tablets at night 02/22/17  Yes Kathrynn Ducking, MD    Allergies as of 02/02/2017 - Review Complete 01/18/2017  Allergen Reaction Noted  . Pantoprazole sodium Itching and Rash 07/16/2010  . Penicillins Itching and Rash 07/16/2010  . Sulfa antibiotics Itching and Rash 07/16/2010  . Demerol [meperidine]  02/02/2017    Family History  Problem Relation Age of Onset  . Diabetes Mother   . COPD Mother   . Heart disease Father   . Cervical cancer Sister   . Colon cancer Neg Hx   . Colon polyps Neg Hx     Social History   Socioeconomic History  . Marital status: Married    Spouse name: Sherren Mocha  . Number  of children: 3  . Years of education: 35  . Highest education level: Not on file  Social Needs  . Financial resource strain: Not on file  . Food insecurity - worry: Not on file  . Food insecurity - inability: Not on file  . Transportation needs - medical: Not on file  . Transportation needs - non-medical: Not on file  Occupational History  . Occupation: unemployed    Fish farm manager: UNEMPLOYED  Tobacco Use  . Smoking status: Current Every Day Smoker    Packs/day: 1.00    Years: 25.00    Pack years: 25.00    Types: Cigarettes  . Smokeless tobacco: Never Used  Substance and Sexual Activity  . Alcohol use: Yes    Alcohol/week: 8.4 - 16.8 oz    Types: 14 - 28 Cans of beer per week    Comment: drinks  2-4 beers a day; occassional  . Drug use: No  . Sexual activity: Yes    Partners: Male    Birth control/protection: Surgical    Comment: spouse  Other Topics Concern  . Not on file  Social History Narrative   Lives with spouse and kids   Caffeine use:     Review of Systems: See HPI, otherwise negative ROS   Physical Exam: BP (!) 143/41   Pulse 84   Temp 97.8 F (36.6 C) (Oral)   Resp 20   Ht 5\' 2"  (1.575 m)   Wt 105 lb (47.6 kg)   SpO2 100%   BMI 19.20 kg/m  General:   Alert,  pleasant and cooperative in NAD Head:  Normocephalic and atraumatic. Neck:  Supple; Lungs:  Clear throughout to auscultation.    Heart:  Regular rate and rhythm. Abdomen:  Soft, nontender and nondistended. Normal bowel sounds, without guarding, and without rebound.   Neurologic:  Alert and  oriented x4;  grossly normal neurologically.  Impression/Plan:     DYSPEPSIA  PLAN:  EGD TODAY DISCUSSED PROCEDURE, BENEFITS, & RISKS: < 1% chance of medication reaction, bleeding, OR perforation.

## 2017-02-23 NOTE — Transfer of Care (Signed)
Immediate Anesthesia Transfer of Care Note  Patient: [name]  Procedure(s) Performed: ESOPHAGOGASTRODUODENOSCOPY (EGD) WITH PROPOFOL (N/A ) BIOPSY  Patient Location: PACU  Anesthesia Type:MAC  Level of Consciousness: awake, alert  and oriented  Airway & Oxygen Therapy: Patient Spontanous Breathing and Patient connected to nasal cannula oxygen  Post-op Assessment: Report given to RN, Post -op Vital signs reviewed and stable and Patient moving all extremities X 4  Post vital signs: Reviewed and stable  Last Vitals:  Vitals:   02/23/17 0750 02/23/17 0755  BP:  (!) 143/41  Pulse:    Resp: 17 20  Temp:    SpO2:      Last Pain:  Vitals:   02/23/17 0718  TempSrc: Oral      Patients Stated Pain Goal: 6 (01/27/50 0258)  Complications: No apparent anesthesia complications

## 2017-02-23 NOTE — Telephone Encounter (Signed)
Patient is schedule for 03/02/17 at [facility].

## 2017-02-23 NOTE — Anesthesia Preprocedure Evaluation (Signed)
Anesthesia Evaluation  Patient identified by MRN, date of birth, ID band Patient awake    Reviewed: Allergy & Precautions, NPO status , Patient's Chart, lab work & pertinent test results  Airway Mallampati: II  TM Distance: >3 FB Neck ROM: Full    Dental  (+) Teeth Intact   Pulmonary neg pulmonary ROS, Current Smoker,    breath sounds clear to auscultation       Cardiovascular + Peripheral Vascular Disease   Rhythm:Regular Rate:Normal     Neuro/Psych  Headaches, TIACVA    GI/Hepatic PUD, GERD  Medicated,  Endo/Other  Hypothyroidism   Renal/GU      Musculoskeletal   Abdominal   Peds  Hematology   Anesthesia Other Findings   Reproductive/Obstetrics                             Anesthesia Physical Anesthesia Plan  ASA: III  Anesthesia Plan: MAC   Post-op Pain Management:    Induction: Intravenous  PONV Risk Score and Plan:   Airway Management Planned: Simple Face Mask  Additional Equipment:   Intra-op Plan:   Post-operative Plan:   Informed Consent: I have reviewed the patients History and Physical, chart, labs and discussed the procedure including the risks, benefits and alternatives for the proposed anesthesia with the patient or authorized representative who has indicated his/her understanding and acceptance.     Plan Discussed with:   Anesthesia Plan Comments:         Anesthesia Quick Evaluation

## 2017-02-23 NOTE — Op Note (Signed)
[facility] Patient Name: [name] Procedure Date: 02/23/2017 7:55 AM MRN: [id number] Date of Birth: 1970-08-22 Attending MD: Barney Drain MD, MD CSN: [id number] Age: 46 Admit Type: Outpatient Procedure:                Upper GI endoscopy WITH COLD FORCEPS BIOPSY Indications:              Dyspepsia, Abdominal bloating, Diarrhea Providers:                Barney Drain MD, MD, Rosina Lowenstein, RN, Charlsie Quest.                            Theda Sers RN, RN, Randa Spike, Technician Referring MD:              Medicines:                Propofol per Anesthesia Complications:            No immediate complications. Estimated Blood Loss:     Estimated blood loss was minimal. Procedure:                Pre-Anesthesia Assessment:                           - Prior to the procedure, a History and Physical                            was performed, and patient medications and                            allergies were reviewed. The patient's tolerance of                            previous anesthesia was also reviewed. The risks                            and benefits of the procedure and the sedation                            options and risks were discussed with the patient.                            All questions were answered, and informed consent                            was obtained. Prior Anticoagulants: The patient                            last took aspirin 1 day and Plavix (clopidogrel) 1                            day prior to the procedure. ASA Grade Assessment:                            II - A patient with mild systemic disease. After  reviewing the risks and benefits, the patient was                            deemed in satisfactory condition to undergo the                            procedure. After obtaining informed consent, the                            endoscope was passed under direct vision.                            Throughout the procedure, the  patient's blood                            pressure, pulse, and oxygen saturations were                            monitored continuously. The EG29-iL0 ([id number])                            scope was introduced through the mouth, and                            advanced to the duodenal bulb. The upper GI                            endoscopy was accomplished without difficulty. The                            patient tolerated the procedure well. Scope In: 8:25:38 AM Scope Out: 8:31:37 AM Total Procedure Duration: 0 hours 5 minutes 59 seconds  Findings:      The examined esophagus was normal.      Patchy mild inflammation characterized by congestion (edema) and       erythema was found in the gastric antrum.      The examined duodenum was normal. Biopsies for histology were taken with       a cold forceps for evaluation of celiac disease(2: BULB, 4: 2ND PORTION). Impression:               - BLOATING MOST LIKELY DUE TO LACTOSE INTOLERANCE,                            LESS LIKELY CELIAC SPRUE                           - MILD Gastritis. Moderate Sedation:      Per Anesthesia Care Recommendation:           - Lactose free diet. LACTASE 2-3 PILLS WHEN                            CONSUMING DAIRY.                           - Continue present  medications.                           - Await pathology results.                           - Return to my office in 4 months.                           - Patient has a contact number available for                            emergencies. The signs and symptoms of potential                            delayed complications were discussed with the                            patient. Return to normal activities tomorrow.                            Written discharge instructions were provided to the                            patient. Procedure Code(s):        --- Professional ---                           [phone], Esophagogastroduodenoscopy, flexible,                             transoral; with biopsy, single or multiple Diagnosis Code(s):        --- Professional ---                           K29.70, Gastritis, unspecified, without bleeding                           R10.13, Epigastric pain                           R14.0, Abdominal distension (gaseous)                           R19.7, Diarrhea, unspecified CPT copyright 2016 [employer]. All rights reserved. The codes documented in this report are preliminary and upon coder review may  be revised to meet current compliance requirements. Barney Drain, MD Barney Drain MD, MD 02/23/2017 8:40:26 AM This report has been signed electronically. Number of Addenda: 0

## 2017-02-23 NOTE — Anesthesia Postprocedure Evaluation (Signed)
Anesthesia Post Note  Patient: [name]  Procedure(s) Performed: ESOPHAGOGASTRODUODENOSCOPY (EGD) WITH PROPOFOL (N/A ) BIOPSY  Patient location during evaluation: Endoscopy Anesthesia Type: MAC Level of consciousness: awake and alert Pain management: pain level controlled Vital Signs Assessment: post-procedure vital signs reviewed and stable Respiratory status: spontaneous breathing, nonlabored ventilation, respiratory function stable and patient connected to nasal cannula oxygen Cardiovascular status: blood pressure returned to baseline and stable Postop Assessment: no apparent nausea or vomiting Anesthetic complications: no     Last Vitals:  Vitals:   02/23/17 0845 02/23/17 0858  BP: 103/73 110/70  Pulse: 69 70  Resp: 12 16  Temp:  36.7 C  SpO2: 100% 98%    Last Pain:  Vitals:   02/23/17 0858  TempSrc: Oral  PainSc: 0-No pain                 Talitha Givens

## 2017-02-25 ENCOUNTER — Encounter (HOSPITAL_COMMUNITY): Payer: Self-pay | Admitting: Gastroenterology

## 2017-02-25 ENCOUNTER — Telehealth: Payer: Self-pay | Admitting: Gastroenterology

## 2017-02-25 NOTE — Telephone Encounter (Signed)
PT is aware.

## 2017-02-25 NOTE — Telephone Encounter (Signed)
PLEASE CALL PT. HER SMALL BOWEL BIOPSIES ARE NORMAL. HER GI UPSET IS MOST LIKELY DUE TO LACTOSE INTOLERANCE.    DRINK WATER TO KEEP YOUR URINE LIGHT YELLOW.  FOLLOW A DAIRY FREE/LOW FAT DIET. MEATS SHOULD BE BAKED, BROILED, OR BOILED. AVOID FRIED FOODS.   WHEN YOU CONSUME DAIRY, TAKE LACTASE 2-3 PILLS WITH MEALS UP TO THREE OR FOUR TIMES A DAY.  CONTINUE PREVACID.  TAKE 30 MINUTES PRIOR TO YOUR FIRST MEAL EVERY DAY. ZANTAC HELPS MOST WHEN USED AS NEEDED.  PLEASE CALL IN ONE MONTH IF YOUR SYMPTOMS ARE NOT IMPROVED.   FOLLOW UP IN 4 MOS E30 DYSPEPSIA/LACTOSE INTOLERANCE/GERD.

## 2017-02-26 NOTE — Telephone Encounter (Signed)
Reminder in epic °

## 2017-03-02 ENCOUNTER — Ambulatory Visit (HOSPITAL_COMMUNITY)
Admission: RE | Admit: 2017-03-02 | Discharge: 2017-03-02 | Disposition: A | Discharge status: Home / Self Care | Payer: BLUE CROSS/BLUE SHIELD | Source: Ambulatory Visit | Attending: Neurology | Admitting: Neurology

## 2017-03-02 DIAGNOSIS — I675 Moyamoya disease: Secondary | ICD-10-CM | POA: Diagnosis not present

## 2017-03-02 DIAGNOSIS — G459 Transient cerebral ischemic attack, unspecified: Secondary | ICD-10-CM

## 2017-03-02 DIAGNOSIS — I6523 Occlusion and stenosis of bilateral carotid arteries: Secondary | ICD-10-CM | POA: Insufficient documentation

## 2017-03-02 MED ORDER — IOPAMIDOL (ISOVUE-370) INJECTION 76%
80.0000 mL | Freq: Once | INTRAVENOUS | Status: AC | PRN
  Administered 2017-03-02: 80 mL via INTRAVENOUS

## 2017-03-03 ENCOUNTER — Telehealth: Payer: Self-pay | Admitting: Neurology

## 2017-03-03 NOTE — Telephone Encounter (Signed)
I called the patient.  The CT angiogram appears to show occlusion of both carotid arteries, this is a known issue.  The posterior circulation appears to be unremarkable.  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  The patient continues to smoke.  The episodes of TIAs have been associated with nausea and vomiting, there may be a reflex reduction in blood pressure and heart rate with these episodes, resulting in a TIA.   CTA head and neck 03/02/17:  IMPRESSION: 1. Severe stenosis along the entire length of the right internal carotid artery, as noted on prior angiographic studies, with the intracranial right ICA the coming occluded at the clinoid segment. Persistent complete occlusion of the left internal carotid artery. 2. 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 3. Normal vertebral arteries and posterior circulation. 4. No acute intracranial abnormality.

## 2017-03-15 ENCOUNTER — Ambulatory Visit (HOSPITAL_COMMUNITY)
Admission: RE | Admit: 2017-03-15 | Discharge: 2017-03-15 | Disposition: A | Discharge status: Home / Self Care | Payer: BLUE CROSS/BLUE SHIELD | Source: Ambulatory Visit | Attending: Nurse Practitioner | Admitting: Nurse Practitioner

## 2017-03-15 DIAGNOSIS — Z1231 Encounter for screening mammogram for malignant neoplasm of breast: Secondary | ICD-10-CM | POA: Insufficient documentation

## 2017-03-25 ENCOUNTER — Other Ambulatory Visit: Payer: Self-pay | Admitting: Gastroenterology

## 2017-04-09 ENCOUNTER — Other Ambulatory Visit: Payer: Self-pay | Admitting: "Endocrinology

## 2017-05-03 ENCOUNTER — Ambulatory Visit: Payer: BLUE CROSS/BLUE SHIELD | Admitting: "Endocrinology

## 2017-05-04 ENCOUNTER — Encounter: Payer: Self-pay | Admitting: Nurse Practitioner

## 2017-05-17 ENCOUNTER — Emergency Department (HOSPITAL_COMMUNITY)
Admission: EM | Admit: 2017-05-17 | Discharge: 2017-05-17 | Disposition: A | Discharge status: Home / Self Care | Payer: BLUE CROSS/BLUE SHIELD | Attending: Emergency Medicine | Admitting: Emergency Medicine

## 2017-05-17 ENCOUNTER — Encounter (HOSPITAL_COMMUNITY): Payer: Self-pay | Admitting: Emergency Medicine

## 2017-05-17 DIAGNOSIS — R51 Headache: Secondary | ICD-10-CM | POA: Diagnosis present

## 2017-05-17 DIAGNOSIS — F17210 Nicotine dependence, cigarettes, uncomplicated: Secondary | ICD-10-CM | POA: Insufficient documentation

## 2017-05-17 DIAGNOSIS — G44209 Tension-type headache, unspecified, not intractable: Secondary | ICD-10-CM | POA: Diagnosis not present

## 2017-05-17 DIAGNOSIS — E039 Hypothyroidism, unspecified: Secondary | ICD-10-CM | POA: Diagnosis not present

## 2017-05-17 MED ORDER — KETOROLAC TROMETHAMINE 30 MG/ML IJ SOLN
30.0000 mg | Freq: Once | INTRAMUSCULAR | Status: AC
  Administered 2017-05-17: 30 mg via INTRAVENOUS
  Filled 2017-05-17: qty 1

## 2017-05-17 MED ORDER — LORAZEPAM 2 MG/ML IJ SOLN
1.0000 mg | Freq: Once | INTRAMUSCULAR | Status: AC
  Administered 2017-05-17: 1 mg via INTRAVENOUS
  Filled 2017-05-17: qty 1

## 2017-05-17 MED ORDER — DEXAMETHASONE SODIUM PHOSPHATE 4 MG/ML IJ SOLN
10.0000 mg | Freq: Once | INTRAMUSCULAR | Status: AC
  Administered 2017-05-17: 10 mg via INTRAVENOUS
  Filled 2017-05-17: qty 3

## 2017-05-17 MED ORDER — METOCLOPRAMIDE HCL 5 MG/ML IJ SOLN
10.0000 mg | Freq: Once | INTRAMUSCULAR | Status: AC
  Administered 2017-05-17: 10 mg via INTRAVENOUS
  Filled 2017-05-17: qty 2

## 2017-05-17 MED ORDER — SODIUM CHLORIDE 0.9 % IV BOLUS (SEPSIS)
500.0000 mL | Freq: Once | INTRAVENOUS | Status: AC
  Administered 2017-05-17: 500 mL via INTRAVENOUS

## 2017-05-17 MED ORDER — DIPHENHYDRAMINE HCL 50 MG/ML IJ SOLN
50.0000 mg | Freq: Once | INTRAMUSCULAR | Status: AC
  Administered 2017-05-17: 50 mg via INTRAVENOUS
  Filled 2017-05-17: qty 1

## 2017-05-17 MED ORDER — METOCLOPRAMIDE HCL 10 MG PO TABS
10.0000 mg | ORAL_TABLET | Freq: Once | ORAL | Status: AC
  Administered 2017-05-17: 10 mg via ORAL
  Filled 2017-05-17: qty 1

## 2017-05-17 NOTE — Discharge Instructions (Signed)

## 2017-05-17 NOTE — ED Provider Notes (Signed)
Emergency Department Provider Note   I have reviewed the triage vital signs and the nursing notes.   HISTORY  Chief Complaint Headache   HPI [name] is a 47 y.o. female with PMH of bilateral carotid artery occlusion, intermittent HA, prior CVA (2012), HLD, tobacco use, and GERD presents to the emergency department for evaluation of return of headache symptoms.  The patient states that she has had similar headaches since 2006.  They seem to stop around the time of her stroke in 2012.  2 days ago she developed mild to moderate right sided headache pain with photophobia and nausea.  The symptoms have gradually worsened over the past 2 days to the point where she is unable to sleep.  She denies any weakness or numbness.  No fevers or chills.  No neck stiffness.  She is tried Topamax at home with no relief in symptoms.  She has known carotid artery occlusion and follows with Dr. Jannifer Franklin for this known issue.  Her next follow-up appointment is in April. No sudden onset, maximal intensity HA.    Past Medical History:  Diagnosis Date  . Bleeding ulcer   . Bronchitis    hx of  . Carotid artery disease ([place])    bilateral internal carotid artery occlusion by duplex ultrasound  . Cervical cancer ([facility])    cervical  . Chronic headaches   . Common migraine with intractable migraine 02/22/2017  . GERD (gastroesophageal reflux disease)   . Hyperlipidemia   . Hypothyroidism   . Pituitary tumor   . PUD (peptic ulcer disease)    NSAIDS   . Stenosis of right carotid artery   . Stroke ([place])    mini-stroke; memory deficits, slight  . TIA (transient ischemic attack)     Patient Active Problem List   Diagnosis Date Noted  . Bloating   . Early satiety   . Common migraine with intractable migraine 02/22/2017  . GERD (gastroesophageal reflux disease) 01/18/2017  . Hyperthyroidism 06/15/2016  . Other specified hypothyroidism 06/15/2016  . Adrenal insufficiency ([place]) 06/15/2016  .  Hyperprolactinemia ([place]) 06/15/2016  . Pituitary microadenoma ([place]) 06/15/2016  . Dyspepsia 05/31/2013  . Hyperlipidemia 09/15/2012  . Carotid artery disease ([place])   . PUD (peptic ulcer disease) 07/16/2010    Past Surgical History:  Procedure Laterality Date  . ABDOMINAL HYSTERECTOMY    . BIOPSY  02/23/2017   Procedure: BIOPSY;  Surgeon: Danie Binder, MD;  Location: [facility];  Service: Endoscopy;;  duodenal bx's  . BLADDER SUSPENSION    . ESOPHAGOGASTRODUODENOSCOPY  02/2010   Dr. Barney Drain. Multiple duodenal ulcers, gastric erosions (bx negative for H.Pylori)  . ESOPHAGOGASTRODUODENOSCOPY N/A 06/19/2013   SLF: 1. Earlene Plater /dyspepsia due to uncontrolled GERD, & NSAID gastritis/duodenitis.  Marland Kitchen ESOPHAGOGASTRODUODENOSCOPY (EGD) WITH PROPOFOL N/A 02/23/2017   Procedure: ESOPHAGOGASTRODUODENOSCOPY (EGD) WITH PROPOFOL;  Surgeon: Danie Binder, MD;  Location: [facility];  Service: Endoscopy;  Laterality: N/A;  8:15am  . Multiple cyst removal     coccyx, left ring finger, pelvic area & throat/vocal cord and pilonidal cyst  . RADIOLOGY WITH ANESTHESIA  03/23/2012   Procedure: RADIOLOGY WITH ANESTHESIA;  Surgeon: Rob Hickman, MD;  Location: [place];  Service: Radiology;  Laterality: N/A;  . TOTAL VAGINAL HYSTERECTOMY      Current Outpatient Rx  . Order #: [id number] Class: Historical Med  . Order #: [id number] Class: Historical Med  . Order #: [id number] Class: Normal  . Order #: [id number] Class: Normal  . Order #:  [id number] Class: Historical Med  . Order #: [id number] Class: Normal  . Order #: [id number] Class: Fill Later  . Order #: [id number] Class: Normal  . Order #: [id number] Class: Historical Med  . Order #: [id number] Class: Historical Med  . Order #: [id number] Class: Normal  . Order #: [id number] Class: Historical Med  . Order #: [id number] Class: Normal    Allergies Pantoprazole sodium; Penicillins; Sulfa antibiotics; Codeine; and Demerol [meperidine]  Family History  Problem  Relation Age of Onset  . Diabetes Mother   . COPD Mother   . Heart disease Father   . Cervical cancer Sister   . Colon cancer Neg Hx   . Colon polyps Neg Hx     Social History Social History   Tobacco Use  . Smoking status: Current Every Day Smoker    Packs/day: 1.00    Years: 25.00    Pack years: 25.00    Types: Cigarettes  . Smokeless tobacco: Never Used  Substance Use Topics  . Alcohol use: Yes    Alcohol/week: 8.4 - 16.8 oz    Types: 14 - 28 Cans of beer per week    Comment: drinks 2-4 beers a day; occassional  . Drug use: No    Review of Systems  Constitutional: No fever/chills Eyes: No visual changes. ENT: No sore throat. Cardiovascular: Denies chest pain. Respiratory: Denies shortness of breath. Gastrointestinal: No abdominal pain. Positive nausea and vomiting.  No diarrhea.  No constipation. Genitourinary: Negative for dysuria. Musculoskeletal: Negative for back pain. Skin: Negative for rash. Neurological: Negative for focal weakness or numbness. Positive HA.   10-point ROS otherwise negative.  ____________________________________________   PHYSICAL EXAM:  VITAL SIGNS: ED Triage Vitals  Enc Vitals Group     BP 05/17/17 0722 (!) 143/84     Pulse Rate 05/17/17 0722 62     Resp 05/17/17 0722 18     Temp 05/17/17 0722 (!) 97.5 F (36.4 C)     Temp Source 05/17/17 0722 Oral     SpO2 05/17/17 0722 100 %     Weight 05/17/17 0720 116 lb (52.6 kg)     Height 05/17/17 0720 5\' 2"  (1.575 m)     Pain Score 05/17/17 0720 10   Constitutional: Alert and oriented. Well appearing and in no acute distress. Eyes: Conjunctivae are normal. PERRL. EOMI. Head: Atraumatic. Nose: No congestion/rhinnorhea. Mouth/Throat: Mucous membranes are moist.  Neck: No stridor.  No meningeal signs.  Cardiovascular: Normal rate, regular rhythm. Good peripheral circulation. Grossly normal heart sounds.   Respiratory: Normal respiratory effort.  No retractions. Lungs  CTAB. Gastrointestinal: Soft and nontender. No distention.  Musculoskeletal: No lower extremity tenderness nor edema. No gross deformities of extremities. Neurologic:  Normal speech and language. No gross focal neurologic deficits are appreciated. Normal CN exam 2-12. No pronator drift.  Skin:  Skin is warm, dry and intact. No rash noted.  ____________________________________________  RADIOLOGY  None ____________________________________________   PROCEDURES  Procedure(s) performed:   Procedures  None ____________________________________________   INITIAL IMPRESSION / ASSESSMENT AND PLAN / ED COURSE  Pertinent labs & imaging results that were available during my care of the patient were reviewed by me and considered in my medical decision making (see chart for details).  Patient presents to the emergency department for acute on chronic right-sided headache with photophobia.  She is tried Topamax at home.  She has known carotid occlusion bilaterally and is followed as an outpatient for this.  No focal neurological deficits on exam.  Plan for symptom relief and reassess.  No indication for CT imaging of the head or neck at this time given similar past headaches.  09:26 AM On reassessment the patient is feeling much better.  She has a significant other at bedside who will drive her home.  She would like to return home and sleep.  Discussed calling her neurologist and other outpatient providers regarding her emergency department visit and need for close follow-up.   At this time, I do not feel there is any life-threatening condition present. I have reviewed and discussed all results (EKG, imaging, lab, urine as appropriate), exam findings with patient. I have reviewed nursing notes and appropriate previous records.  I feel the patient is safe to be discharged home without further emergent workup. Discussed usual and customary return precautions. Patient and family (if present) verbalize  understanding and are comfortable with this plan.  Patient will follow-up with their primary care provider. If they do not have a primary care provider, information for follow-up has been provided to them. All questions have been answered.  ____________________________________________  FINAL CLINICAL IMPRESSION(S) / ED DIAGNOSES  Final diagnoses:  Acute non intractable tension-type headache     MEDICATIONS GIVEN DURING THIS VISIT:  Medications  sodium chloride 0.9 % bolus 500 mL (500 mLs Intravenous New Bag/Given 05/17/17 0747)  ketorolac (TORADOL) 30 MG/ML injection 30 mg (30 mg Intravenous Given 05/17/17 0748)  metoCLOPramide (REGLAN) tablet 10 mg (10 mg Oral Given 05/17/17 0748)  diphenhydrAMINE (BENADRYL) injection 50 mg (50 mg Intravenous Given 05/17/17 0748)  dexamethasone (DECADRON) injection 10 mg (10 mg Intravenous Given 05/17/17 0747)  LORazepam (ATIVAN) injection 1 mg (1 mg Intravenous Given 05/17/17 0845)  metoCLOPramide (REGLAN) injection 10 mg (10 mg Intravenous Given 05/17/17 0845)    Note:  This document was prepared using Dragon voice recognition software and may include unintentional dictation errors.  Nanda Quinton, MD Emergency Medicine    Sherlene Rickel, [facility], MD 05/17/17 [phone]

## 2017-05-17 NOTE — ED Triage Notes (Signed)
Pt reports headache starting a few days ago worsening in severity.

## 2017-05-17 NOTE — ED Notes (Signed)
Pt states her pain is unchanged and now she has restless legs.

## 2017-05-18 LAB — BASIC METABOLIC PANEL
BUN: 8 (ref 4–21)
Creatinine: 0.8 (ref <=1.1)

## 2017-05-24 ENCOUNTER — Ambulatory Visit: Payer: BLUE CROSS/BLUE SHIELD | Admitting: "Endocrinology

## 2017-05-27 ENCOUNTER — Ambulatory Visit: Payer: BLUE CROSS/BLUE SHIELD | Admitting: Nurse Practitioner

## 2017-05-29 ENCOUNTER — Other Ambulatory Visit: Payer: Self-pay | Admitting: Gastroenterology

## 2017-06-10 ENCOUNTER — Encounter: Payer: Self-pay | Admitting: "Endocrinology

## 2017-06-10 ENCOUNTER — Ambulatory Visit: Payer: BLUE CROSS/BLUE SHIELD | Admitting: "Endocrinology

## 2017-06-10 VITALS — BP 129/85 | HR 96 | Ht 62.0 in | Wt 107.0 lb

## 2017-06-10 DIAGNOSIS — E2740 Unspecified adrenocortical insufficiency: Secondary | ICD-10-CM | POA: Diagnosis not present

## 2017-06-10 DIAGNOSIS — E038 Other specified hypothyroidism: Secondary | ICD-10-CM

## 2017-06-10 DIAGNOSIS — E221 Hyperprolactinemia: Secondary | ICD-10-CM | POA: Diagnosis not present

## 2017-06-10 MED ORDER — LEVOTHYROXINE SODIUM 50 MCG PO TABS: 50.0000 ug | ORAL_TABLET | Freq: Every day | ORAL | 2 refills | Status: DC

## 2017-06-10 NOTE — Progress Notes (Signed)
Subjective:    Patient ID: [name], female    DOB: 06-06-1970, PCP Barry Dienes, NP   Past Medical History:  Diagnosis Date  . Bleeding ulcer   . Bronchitis    hx of  . Carotid artery disease ([place])    bilateral internal carotid artery occlusion by duplex ultrasound  . Cervical cancer ([facility])    cervical  . Chronic headaches   . Common migraine with intractable migraine 02/22/2017  . GERD (gastroesophageal reflux disease)   . Hyperlipidemia   . Hypothyroidism   . Pituitary tumor   . PUD (peptic ulcer disease)    NSAIDS   . Stenosis of right carotid artery   . Stroke ([place])    mini-stroke; memory deficits, slight  . TIA (transient ischemic attack)    Past Surgical History:  Procedure Laterality Date  . ABDOMINAL HYSTERECTOMY    . BIOPSY  02/23/2017   Procedure: BIOPSY;  Surgeon: Danie Binder, MD;  [facility];  Service: Endoscopy;;  duodenal bx's  . BLADDER SUSPENSION    . ESOPHAGOGASTRODUODENOSCOPY  02/2010   Dr. Barney Drain. Multiple duodenal ulcers, gastric erosions (bx negative for H.Pylori)  . ESOPHAGOGASTRODUODENOSCOPY N/A 06/19/2013   SLF: 1. Earlene Plater /dyspepsia due to uncontrolled GERD, & NSAID gastritis/duodenitis.  Marland Kitchen ESOPHAGOGASTRODUODENOSCOPY (EGD) WITH PROPOFOL N/A 02/23/2017   Procedure: ESOPHAGOGASTRODUODENOSCOPY (EGD) WITH PROPOFOL;  Surgeon: Danie Binder, MD;  [facility];  Service: Endoscopy;  Laterality: N/A;  8:15am  . Multiple cyst removal     coccyx, left ring finger, pelvic area & throat/vocal cord and pilonidal cyst  . RADIOLOGY WITH ANESTHESIA  03/23/2012   Procedure: RADIOLOGY WITH ANESTHESIA;  Surgeon: Rob Hickman, MD;  Location: [place];  Service: Radiology;  Laterality: N/A;  . TOTAL VAGINAL HYSTERECTOMY     Social History   Socioeconomic History  . Marital status: Married    Spouse name: Sherren Mocha  . Number of children: 3  . Years of education: 48  . Highest education level: None  Social Needs  .  Financial resource strain: None  . Food insecurity - worry: None  . Food insecurity - inability: None  . Transportation needs - medical: None  . Transportation needs - non-medical: None  Occupational History  . Occupation: unemployed    Fish farm manager: UNEMPLOYED  Tobacco Use  . Smoking status: Current Every Day Smoker    Packs/day: 1.00    Years: 25.00    Pack years: 25.00    Types: Cigarettes  . Smokeless tobacco: Never Used  Substance and Sexual Activity  . Alcohol use: Yes    Alcohol/week: 8.4 - 16.8 oz    Types: 14 - 28 Cans of beer per week    Comment: drinks 2-4 beers a day; occassional  . Drug use: No  . Sexual activity: Yes    Partners: Male    Birth control/protection: Surgical    Comment: spouse  Other Topics Concern  . None  Social History Narrative   Lives with spouse and kids   Caffeine use:    Outpatient Encounter Medications as of 06/10/2017  Medication Sig  . Alpha-D-Galactosidase (BEANO) TABS Take 1 tablet by mouth as needed (gas).  Marland Kitchen aspirin EC 81 MG tablet Take 81 mg by mouth daily.  . clopidogrel (PLAVIX) 75 MG tablet Take 1 tablet (75 mg total) by mouth daily.  Marland Kitchen estradiol (ESTRACE) 2 MG tablet TAKE 1 TABLET BY MOUTH ONCE DAILY.  Marland Kitchen Icosapent Ethyl (VASCEPA) 1  G CAPS Take 2 g by mouth 2 (two) times daily.   . lansoprazole (PREVACID) 30 MG capsule Take 1 capsule (30 mg total) by mouth daily before breakfast.  . levothyroxine (SYNTHROID, LEVOTHROID) 50 MCG tablet Take 1 tablet (50 mcg total) by mouth daily.  . Multiple Vitamin (MULTIVITAMIN) tablet Take 1 tablet by mouth daily.  . naproxen sodium (ALEVE) 220 MG tablet Take 220 mg by mouth daily as needed (pain).  . ranitidine (ZANTAC) 300 MG tablet TAKE ONE TABLET BY MOUTH AT BEDTIME.  . rosuvastatin (CRESTOR) 20 MG tablet Take 20 mg by mouth daily.  . [DISCONTINUED] levothyroxine (SYNTHROID, LEVOTHROID) 50 MCG tablet Take 1 tablet (50 mcg total) by mouth daily.  . [DISCONTINUED] levothyroxine (SYNTHROID,  LEVOTHROID) 50 MCG tablet TAKE 1 TABLET BY MOUTH ONCE DAILY.  . [DISCONTINUED] topiramate (TOPAMAX) 25 MG tablet Take one tablet at night for one week, then take 2 tablets at night   No facility-administered encounter medications on file as of 06/10/2017.    ALLERGIES: Allergies  Allergen Reactions  . Pantoprazole Sodium Itching and Rash  . Penicillins Itching and Rash    Has patient had a PCN reaction causing immediate rash, facial/tongue/throat swelling, SOB or lightheadedness with hypotension: Yes Has patient had a PCN reaction causing severe rash involving mucus membranes or skin necrosis: No Has patient had a PCN reaction that required hospitalization: No Has patient had a PCN reaction occurring within the last 10 years: No If all of the above answers are "NO", then may proceed with Cephalosporin use.   . Sulfa Antibiotics Itching and Rash  . Codeine Nausea And Vomiting  . Demerol [Meperidine]     Red streaks on arm after coadministration of phenergan and demerol at time of EGD in 2012. Tolerated phenergan with subsequent EGD.    VACCINATION STATUS:  There is no immunization history on file for this patient.  HPI 47 year old female patient with multiple medical problems as above. She is here today to follow-up for her hypothyroidism, history of adrenal insufficiency, hyperprolactinemia/pituitary microadenoma. - Patient continues to feel better. She is currently on levothyroxine 50 mcg p.o. every morning.  She reports compliance.   -She has no new complaints.  She has gained some weight. -Her recent labs from May 18, 2017 where showing normal CMP, thyroid function tests within target.  -  she denies any recent hospitalization for adrenal crisis. - She has history of leukocytosis/ thrombocytosis, bilateral carotid artery stenosis complicated by cerebrovascular accident.   Review of Systems  Constitutional: + Light build-progressively gaining weight, - fatigue, +subjective   hypothermia Eyes: no blurry vision, no xerophthalmia ENT: no sore throat, no nodules palpated in throat, no dysphagia/odynophagia, no hoarseness Cardiovascular: No chest pain, no shortness of breath, no palpitations.   Respiratory:  + cough, no SOB Gastrointestinal: No nausea,  no Nausea/Vomiting/Diarhhea, + poor appetite Musculoskeletal: no muscle/joint aches Skin:  - rashes Neurological: no tremors, no numbness, no tingling, no dizziness Psychiatric: no depression, no anxiety  Objective:    BP 129/85   Pulse 96   Ht 5\' 2"  (1.575 m)   Wt 107 lb (48.5 kg)   BMI 19.57 kg/m   Wt Readings from Last 3 Encounters:  06/10/17 107 lb (48.5 kg)  05/17/17 116 lb (52.6 kg)  02/23/17 105 lb (47.6 kg)    Physical Exam  Constitutional:  + light build, not in acute distress, normal state of mind Eyes: PERRLA, EOMI, no exophthalmos ENT: moist mucous membranes, no thyromegaly, no cervical lymphadenopathy  Cardiovascular: normal precordial activity, Regular Rate and Rhythm, no Murmur/Rubs/Gallops Respiratory:  adequate breathing efforts, no gross chest deformity Gastrointestinal: abdomen soft, Non -tender, No distension, Bowel Sounds present Musculoskeletal: no gross deformities, strength intact in all four extremities Skin: moist,    normal skin color, +no active rashes Neurological: no tremor with outstretched hands, Deep tendon reflexes normal in all four extremities.  CMP ( most recent) CMP     Component Value Date/Time   NA 136 02/18/2017 1408   K 3.7 02/18/2017 1408   CL 101 02/18/2017 1408   CO2 27 02/18/2017 1408   GLUCOSE 64 (L) 02/18/2017 1408   BUN 6 02/18/2017 1408   CREATININE 0.59 02/18/2017 1408   CALCIUM 9.0 02/18/2017 1408   PROT 7.8 02/05/2016 1516   ALBUMIN 4.2 02/05/2016 1516   AST 35 02/05/2016 1516   ALT 21 02/05/2016 1516   ALKPHOS 69 02/05/2016 1516   BILITOT 0.6 02/05/2016 1516   GFRNONAA >60 02/18/2017 1408   GFRAA >60 02/18/2017 1408   Labs from  May 19, 2017 showed TSH 0.682, free T4 1.14.  Labs from 02/05/2016 showed TSH 1.68, free T4 0.49   Labs from 12/21/2016: TSH 1.7, free T4 0.79 (normal 0.8-1 277), sodium 140, potassium 5.4, leukocytosis at 13, normal lipid panel.  Assessment & Plan:   1. Hypothyroidism 2. Adrenal insufficiency ([place]) 3. Hyperprolactinemia ([place]) 4. Pituitary microadenoma ([place])   - She has long and complicated medical history including what appears to be steroid induced adrenal insufficiency which required up to 7.5 mg of prednisone daily for at least 6 years, until she decided to stop prednisone In February 2018. She denied any interval hospitalizations or adrenal crisis.  -Her March 2018 a.m. cortisol prior to her last visit was adequate at 11.8.  She was kept off of glucocorticoid supplement.  Unfortunately her previsit labs this time did not include a.m. cortisol even though it was ordered.   -She will return in 3 months with labs including a.m. cortisol.  - Regarding her history of hyperprolactinemia related to history microadenoma (7 mm pituitary adenoma based on MRI in 2012). - Her prolactin level is at 11.9 on 06/17/2078, acceptable off of cabergoline therapy. I advised her to continue off therapy for now. She will not require any surveillance MRI of sella/pituitary this time.    -Her thyroid function tests are appropriate for the current dose of levothyroxine at 50 mcg p.o. every morning.  She is advised to continue on same.    - We discussed about correct intake of levothyroxine, at fasting, with water, separated by at least 30 minutes from breakfast, and separated by more than 4 hours from calcium, iron, multivitamins, acid reflux medications (PPIs). -Patient is made aware of the fact that thyroid hormone replacement is needed for life, dose to be adjusted by periodic monitoring of thyroid function tests.      - Due to her body habitus being light build and propensity for multiple autoimmune  dysfunction including premature surgical menopause, she will need a screening bone density test for osteoporosis- which is pending.  - I advised patient to maintain close follow up with Barry Dienes, NP for primary care needs. Follow up plan: Return in about 3 months (around 09/07/2017) for follow up with pre-visit labs.  Glade Lloyd, MD Phone: [phone]  Fax: [phone]  This note was partially dictated with voice recognition software. Similar sounding words can be transcribed inadequately or may not  be corrected upon review.  06/10/2017, 11:09 AM

## 2017-06-23 ENCOUNTER — Ambulatory Visit: Payer: BLUE CROSS/BLUE SHIELD | Admitting: Gastroenterology

## 2017-07-02 ENCOUNTER — Inpatient Hospital Stay (HOSPITAL_COMMUNITY): Payer: BLUE CROSS/BLUE SHIELD | Attending: Internal Medicine | Admitting: Internal Medicine

## 2017-07-02 ENCOUNTER — Inpatient Hospital Stay (HOSPITAL_COMMUNITY): Payer: BLUE CROSS/BLUE SHIELD

## 2017-07-02 ENCOUNTER — Encounter (HOSPITAL_COMMUNITY): Payer: Self-pay | Admitting: Internal Medicine

## 2017-07-02 VITALS — BP 109/71 | HR 84 | Temp 98.2°F | Resp 14 | Wt 109.9 lb

## 2017-07-02 DIAGNOSIS — F17210 Nicotine dependence, cigarettes, uncomplicated: Secondary | ICD-10-CM | POA: Diagnosis not present

## 2017-07-02 DIAGNOSIS — D352 Benign neoplasm of pituitary gland: Secondary | ICD-10-CM | POA: Diagnosis not present

## 2017-07-02 DIAGNOSIS — D72828 Other elevated white blood cell count: Secondary | ICD-10-CM | POA: Diagnosis not present

## 2017-07-02 LAB — CBC WITH DIFFERENTIAL/PLATELET
BASOS ABS: 0.1 10*3/uL (ref 0.0–0.1)
BASOS PCT: 1 %
EOS ABS: 0.3 10*3/uL (ref 0.0–0.7)
EOS PCT: 3 %
HCT: 35.4 % — ABNORMAL LOW (ref 36.0–46.0)
HEMOGLOBIN: 11.6 g/dL — AB (ref 12.0–15.0)
LYMPHS ABS: 5.4 10*3/uL — AB (ref 0.7–4.0)
Lymphocytes Relative: 42 %
MCH: 29.8 pg (ref 26.0–34.0)
MCHC: 32.8 g/dL (ref 30.0–36.0)
MCV: 91 fL (ref 78.0–100.0)
Monocytes Absolute: 0.8 10*3/uL (ref 0.1–1.0)
Monocytes Relative: 6 %
NEUTROS PCT: 48 %
Neutro Abs: 6.2 10*3/uL (ref 1.7–7.7)
PLATELETS: 369 10*3/uL (ref 150–400)
RBC: 3.89 MIL/uL (ref 3.87–5.11)
RDW: 12.7 % (ref 11.5–15.5)
WBC: 12.8 10*3/uL — AB (ref 4.0–10.5)

## 2017-07-02 LAB — COMPREHENSIVE METABOLIC PANEL
ALBUMIN: 4 g/dL (ref 3.5–5.0)
ALK PHOS: 72 U/L (ref 38–126)
ALT: 10 U/L — AB (ref 14–54)
AST: 17 U/L (ref 15–41)
Anion gap: 10 (ref 5–15)
BUN: 11 mg/dL (ref 6–20)
CALCIUM: 9.3 mg/dL (ref 8.9–10.3)
CHLORIDE: 108 mmol/L (ref 101–111)
CO2: 22 mmol/L (ref 22–32)
CREATININE: 0.6 mg/dL (ref 0.44–1.00)
GFR calc Af Amer: >60 mL/min (ref >60)
GFR calc non Af Amer: >60 mL/min (ref >60)
GLUCOSE: 96 mg/dL (ref 65–99)
Potassium: 3.5 mmol/L (ref 3.5–5.1)
SODIUM: 140 mmol/L (ref 135–145)
Total Bilirubin: 0.5 mg/dL (ref 0.3–1.2)
Total Protein: 7.5 g/dL (ref 6.5–8.1)

## 2017-07-02 LAB — SEDIMENTATION RATE: Sed Rate: 22 mm/hr (ref 0–22)

## 2017-07-02 LAB — LACTATE DEHYDROGENASE: LDH: 146 U/L (ref 98–192)

## 2017-07-02 LAB — C-REACTIVE PROTEIN: CRP: 1.9 mg/dL — ABNORMAL HIGH (ref <1.0)

## 2017-07-02 NOTE — Progress Notes (Signed)
Diagnosis Other elevated white blood cell (WBC) count - Plan: CBC with Differential/Platelet, Comprehensive metabolic panel, Lactate dehydrogenase, BCR-ABL1, CML/ALL, PCR, QUANT, JAK2 genotypr, Sedimentation rate, C-reactive protein, Ambulatory Referral for Lung Cancer Screening  Staging Cancer Staging No matching staging information was found for the patient.  Assessment and Plan.  1.  Leukocytosis - chronic since 2012 .  The patient has not been seen at [facility] since 2017.  Previous workup included  flow cytometry which did not show any clonal lymphocyte population, jak 2 testing was negative and BCR ABL testing was negative in 2012.  She had labs done with Dr. Markus Jarvis on 05/18/2017 which showed a white count 14.8 hemoglobin 11.9 platelets were 447,000.  No significant abnormalities were noted on differential.    She has ongoing history of smoking and this is felt to be contributing to her ongoing mild leukocytosis.  Will repeat BCR/ABL and Jak testing along with sed rate, C-reactive protein, LDH.  She will return to clinic in 2 weeks to go over lab studies.  2.   Pituitary adenoma/prolactinoma with adrenal insufficiency.  Continue to follow-up with endocrinology as recommended.  She reports she is no longer on prednisone.  3.  Smoking.  Cessation is recommended.  She will be referred to the lung cancer screening clinic for evaluation.  Interval History: 47 year old female previously seen at [facility] in 2017.  She had a negative workup with normal flow cytometry, Jak 2 testing and BCR/ABL.  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  She was previously on prednisone but reports she is no longer taking prednisone.  She continues to smoke.  Current Status: Patient seen today for follow-up.  She had recent labs done on 05/18/2017 that showed a white count of 14.8 hemoglobin 11.9 platelets 147,000.  She denies any fever, chills, night sweats, and has noted no  adenopathy.  Problem List Patient Active Problem List   Diagnosis Date Noted  . Bloating [R14.0]   . Early satiety [R68.81]   . Common migraine with intractable migraine [G43.019] 02/22/2017  . GERD (gastroesophageal reflux disease) [K21.9] 01/18/2017  . Hyperthyroidism [E05.90] 06/15/2016  . Other specified hypothyroidism [E03.8] 06/15/2016  . Adrenal insufficiency ([place]) [E27.40] 06/15/2016  . Hyperprolactinemia ([place]) [E22.1] 06/15/2016  . Pituitary microadenoma ([place]) [D35.2] 06/15/2016  . Dyspepsia [R10.13] 05/31/2013  . Hyperlipidemia [E78.5] 09/15/2012  . Carotid artery disease ([place]) [I77.9]   . PUD (peptic ulcer disease) [K27.9] 07/16/2010    Past Medical History Past Medical History:  Diagnosis Date  . Bleeding ulcer   . Bronchitis    hx of  . Carotid artery disease ([place])    bilateral internal carotid artery occlusion by duplex ultrasound  . Cervical cancer ([facility])    cervical  . Chronic headaches   . Common migraine with intractable migraine 02/22/2017  . GERD (gastroesophageal reflux disease)   . Hyperlipidemia   . Hypothyroidism   . Pituitary tumor   . PUD (peptic ulcer disease)    NSAIDS   . Stenosis of right carotid artery   . Stroke ([place])    mini-stroke; memory deficits, slight  . TIA (transient ischemic attack)     Past Surgical History Past Surgical History:  Procedure Laterality Date  . ABDOMINAL HYSTERECTOMY    . BIOPSY  02/23/2017   Procedure: BIOPSY;  Surgeon: Danie Binder, MD;  [facility];  Service: Endoscopy;;  duodenal bx's  . BLADDER SUSPENSION    . ESOPHAGOGASTRODUODENOSCOPY  02/2010  Dr. Barney Drain. Multiple duodenal ulcers, gastric erosions (bx negative for H.Pylori)  . ESOPHAGOGASTRODUODENOSCOPY N/A 06/19/2013   SLF: 1. Earlene Plater /dyspepsia due to uncontrolled GERD, & NSAID gastritis/duodenitis.  Marland Kitchen ESOPHAGOGASTRODUODENOSCOPY (EGD) WITH PROPOFOL N/A 02/23/2017   Procedure: ESOPHAGOGASTRODUODENOSCOPY (EGD) WITH PROPOFOL;   Surgeon: Danie Binder, MD;  [facility];  Service: Endoscopy;  Laterality: N/A;  8:15am  . Multiple cyst removal     coccyx, left ring finger, pelvic area & throat/vocal cord and pilonidal cyst  . RADIOLOGY WITH ANESTHESIA  03/23/2012   Procedure: RADIOLOGY WITH ANESTHESIA;  Surgeon: Rob Hickman, MD;  Location: [place];  Service: Radiology;  Laterality: N/A;  . TOTAL VAGINAL HYSTERECTOMY      Family History Family History  Problem Relation Age of Onset  . Diabetes Mother   . COPD Mother   . Heart disease Father   . Cervical cancer Sister   . Colon cancer Neg Hx   . Colon polyps Neg Hx      Social History  reports that she has been smoking cigarettes.  She has a 25.00 pack-year smoking history. She has never used smokeless tobacco. She reports that she drinks about 8.4 - 16.8 oz of alcohol per week. She reports that she does not use drugs.  Medications  Current Outpatient Medications:  .  Alpha-D-Galactosidase (BEANO) TABS, Take 1 tablet by mouth as needed (gas)., Disp: , Rfl:  .  aspirin EC 81 MG tablet, Take 81 mg by mouth daily., Disp: , Rfl:  .  clopidogrel (PLAVIX) 75 MG tablet, Take 1 tablet (75 mg total) by mouth daily., Disp: 30 tablet, Rfl: 0 .  estradiol (ESTRACE) 2 MG tablet, TAKE 1 TABLET BY MOUTH ONCE DAILY., Disp: 30 tablet, Rfl: 11 .  Icosapent Ethyl (VASCEPA) 1 G CAPS, Take 2 g by mouth 2 (two) times daily. , Disp: , Rfl:  .  lansoprazole (PREVACID) 30 MG capsule, Take 1 capsule (30 mg total) by mouth daily before breakfast., Disp: 30 capsule, Rfl: 11 .  levothyroxine (SYNTHROID, LEVOTHROID) 50 MCG tablet, Take 1 tablet (50 mcg total) by mouth daily., Disp: 90 tablet, Rfl: 2 .  Multiple Vitamin (MULTIVITAMIN) tablet, Take 1 tablet by mouth daily., Disp: , Rfl:  .  naproxen sodium (ALEVE) 220 MG tablet, Take 220 mg by mouth daily as needed (pain)., Disp: , Rfl:  .  ranitidine (ZANTAC) 300 MG tablet, TAKE ONE TABLET BY MOUTH AT BEDTIME., Disp: 30  tablet, Rfl: 11 .  rosuvastatin (CRESTOR) 20 MG tablet, Take 20 mg by mouth daily., Disp: , Rfl:   Allergies Pantoprazole sodium; Penicillins; Sulfa antibiotics; Codeine; and Demerol [meperidine]  Review of Systems Review of Systems - Oncology ROS as per HPI otherwise 12 point ROS is negative.   Physical Exam  Vitals Wt Readings from Last 3 Encounters:  07/02/17 109 lb 14.4 oz (49.9 kg)  06/10/17 107 lb (48.5 kg)  05/17/17 116 lb (52.6 kg)   Temp Readings from Last 3 Encounters:  07/02/17 98.2 F (36.8 C) (Oral)  05/17/17 (!) 97.5 F (36.4 C) (Oral)  02/23/17 98 F (36.7 C) (Oral)   BP Readings from Last 3 Encounters:  07/02/17 109/71  06/10/17 129/85  05/17/17 101/62   Pulse Readings from Last 3 Encounters:  07/02/17 84  06/10/17 96  05/17/17 62   Constitutional: Well-developed, well-nourished, and in no distress.   HENT: Head: Normocephalic and atraumatic.  Mouth/Throat: No oropharyngeal exudate. Mucosa moist. Eyes: Pupils are equal, round, and reactive to  light. Conjunctivae are normal. No scleral icterus.  Neck: Normal range of motion. Neck supple. No JVD present.  Cardiovascular: Normal rate, regular rhythm and normal heart sounds.  Exam reveals no gallop and no friction rub.   No murmur heard. Pulmonary/Chest: Effort normal and breath sounds normal. No respiratory distress. No wheezes.No rales.  Abdominal: Soft. Bowel sounds are normal. No distension. There is no tenderness. There is no guarding.  Musculoskeletal: No edema or tenderness.  Lymphadenopathy: No cervical, axillary or supraclavicular adenopathy.  Neurological: Alert and oriented to person, place, and time. No cranial nerve deficit.  Skin: Skin is warm and dry. No rash noted. No erythema. No pallor.  Psychiatric: Affect and judgment normal.   Labs Appointment on 07/02/2017  Component Date Value Ref Range Status  . WBC 07/02/2017 12.8* 4.0 - 10.5 K/uL Final  . RBC 07/02/2017 3.89  3.87 - 5.11  MIL/uL Final  . Hemoglobin 07/02/2017 11.6* 12.0 - 15.0 g/dL Final  . HCT 07/02/2017 35.4* 36.0 - 46.0 % Final  . MCV 07/02/2017 91.0  78.0 - 100.0 fL Final  . MCH 07/02/2017 29.8  26.0 - 34.0 pg Final  . MCHC 07/02/2017 32.8  30.0 - 36.0 g/dL Final  . RDW 07/02/2017 12.7  11.5 - 15.5 % Final  . Platelets 07/02/2017 369  150 - 400 K/uL Final  . Neutrophils Relative % 07/02/2017 48  % Final  . Neutro Abs 07/02/2017 6.2  1.7 - 7.7 K/uL Final  . Lymphocytes Relative 07/02/2017 42  % Final  . Lymphs Abs 07/02/2017 5.4* 0.7 - 4.0 K/uL Final  . Monocytes Relative 07/02/2017 6  % Final  . Monocytes Absolute 07/02/2017 0.8  0.1 - 1.0 K/uL Final  . Eosinophils Relative 07/02/2017 3  % Final  . Eosinophils Absolute 07/02/2017 0.3  0.0 - 0.7 K/uL Final  . Basophils Relative 07/02/2017 1  % Final  . Basophils Absolute 07/02/2017 0.1  0.0 - 0.1 K/uL Final   Performed at [facility], [address] [place], [place] [address]  . Sodium 07/02/2017 140  135 - 145 mmol/L Final  . Potassium 07/02/2017 3.5  3.5 - 5.1 mmol/L Final  . Chloride 07/02/2017 108  101 - 111 mmol/L Final  . CO2 07/02/2017 22  22 - 32 mmol/L Final  . Glucose, Bld 07/02/2017 96  65 - 99 mg/dL Final  . BUN 07/02/2017 11  6 - 20 mg/dL Final  . Creatinine, Ser 07/02/2017 0.60  0.44 - 1.00 mg/dL Final  . Calcium 07/02/2017 9.3  8.9 - 10.3 mg/dL Final  . Total Protein 07/02/2017 7.5  6.5 - 8.1 g/dL Final  . Albumin 07/02/2017 4.0  3.5 - 5.0 g/dL Final  . AST 07/02/2017 17  15 - 41 U/L Final  . ALT 07/02/2017 10* 14 - 54 U/L Final  . Alkaline Phosphatase 07/02/2017 72  38 - 126 U/L Final  . Total Bilirubin 07/02/2017 0.5  0.3 - 1.2 mg/dL Final  . GFR calc non Af Amer 07/02/2017 >60  >60 mL/min Final  . GFR calc Af Amer 07/02/2017 >60  >60 mL/min Final   Comment: (NOTE) The eGFR has been calculated using the CKD EPI equation. This calculation has not been validated in all clinical situations. eGFR's persistently <60 mL/min signify  possible Chronic Kidney Disease.   Georgiann Hahn gap 07/02/2017 10  5 - 15 Final   Performed at [facility], [address], [place], [place] [address]  . LDH 07/02/2017 146  98 - 192 U/L  Final   Performed at [facility], [address] [place], [place] [address]  . Sed Rate 07/02/2017 22  0 - 22 mm/hr Final   Performed at [facility], [address] [place], [place] [address]     Pathology Orders Placed This Encounter  Procedures  . CBC with Differential/Platelet    Standing Status:   Future    Number of Occurrences:   1    Standing Expiration Date:   07/03/2018  . Comprehensive metabolic panel    Standing Status:   Future    Number of Occurrences:   1    Standing Expiration Date:   07/03/2018  . Lactate dehydrogenase    Standing Status:   Future    Number of Occurrences:   1    Standing Expiration Date:   07/03/2018  . BCR-ABL1, CML/ALL, PCR, QUANT  . JAK2 genotypr    Standing Status:   Future    Number of Occurrences:   1    Standing Expiration Date:   07/02/2018  . Sedimentation rate    Standing Status:   Future    Number of Occurrences:   1    Standing Expiration Date:   07/02/2018  . C-reactive protein    Standing Status:   Future    Number of Occurrences:   1    Standing Expiration Date:   07/02/2018  . Ambulatory Referral for Lung Cancer Screening    Referral Priority:   Routine    Referral Type:   Consultation    Referral Reason:   Specialty Services Required    Number of Visits Requested:   [place] MD

## 2017-07-02 NOTE — Patient Instructions (Addendum)
[place] at [facility] Discharge Instructions  You were seen today by Dr. Walden Field. She went over your recent labs and your smoking status. Dr. Walden Field discussed your recent labs and testing that was done by Dr. Whitney Muse. She would like to repeat some of your labs. Sometimes smoking can cause your counts to be elevated. She discussed the Lung Cancer Screening Program. We will get labs today and see you back in 2 weeks for follow up.   Thank you for choosing [place] at [facility] to provide your oncology and hematology care.  To afford each patient quality time with our provider, please arrive at least 15 minutes before your scheduled appointment time.   If you have a lab appointment with the [place] please come in thru the  Main Entrance and check in at the main information desk  You need to re-schedule your appointment should you arrive 10 or more minutes late.  We strive to give you quality time with our providers, and arriving late affects you and other patients whose appointments are after yours.  Also, if you no show three or more times for appointments you may be dismissed from the clinic at the providers discretion.     Again, thank you for choosing [facility].  Our hope is that these requests will decrease the amount of time that you wait before being seen by our physicians.       _____________________________________________________________  Should you have questions after your visit to [facility], please contact our office at [phone] between the hours of 8:30 a.m. and 4:30 p.m.  Voicemails left after 4:30 p.m. will not be returned until the following business day.  For prescription refill requests, have your pharmacy contact our office.       Resources For Cancer Patients and their Caregivers ? American Cancer Society: Can assist with transportation, wigs, general needs, runs Look Good Feel  Better.        641-709-5390 ? Cancer Care: Provides financial assistance, online support groups, medication/co-pay assistance.  1-800-813-HOPE 705-763-9837) ? [place] Assists [place] cancer patients and their families through emotional , educational and financial support.  620 462 3198 ? [place] DSS Where to apply for food stamps, Medicaid and utility assistance. (941)026-7332 ? [facility]: Transportation to medical appointments. (628)415-6064 ? Social Security Administration: May apply for disability if have a Stage IV cancer. 2700609082 848-323-0425 ? [employer], Disability and Transit Services: Assists with nutrition, care and transit needs. [place] Support Programs:   > Cancer Support Group  2nd Tuesday of the month 1pm-2pm, Journey Room   > Creative Journey  3rd Tuesday of the month 1130am-1pm, Journey Room

## 2017-07-05 LAB — JAK2 GENOTYPR

## 2017-07-07 LAB — BCR-ABL1, CML/ALL, PCR, QUANT: B2A2 TRANSCRIPT: 0.0383 %

## 2017-07-15 ENCOUNTER — Ambulatory Visit: Payer: BLUE CROSS/BLUE SHIELD | Admitting: Nurse Practitioner

## 2017-07-15 ENCOUNTER — Encounter: Payer: Self-pay | Admitting: Nurse Practitioner

## 2017-07-15 VITALS — BP 110/73 | HR 78 | Ht 62.0 in | Wt 109.0 lb

## 2017-07-15 DIAGNOSIS — G459 Transient cerebral ischemic attack, unspecified: Secondary | ICD-10-CM

## 2017-07-15 DIAGNOSIS — E782 Mixed hyperlipidemia: Secondary | ICD-10-CM

## 2017-07-15 DIAGNOSIS — R51 Headache: Secondary | ICD-10-CM | POA: Diagnosis not present

## 2017-07-15 DIAGNOSIS — I6529 Occlusion and stenosis of unspecified carotid artery: Secondary | ICD-10-CM | POA: Diagnosis not present

## 2017-07-15 DIAGNOSIS — R519 Headache, unspecified: Secondary | ICD-10-CM | POA: Insufficient documentation

## 2017-07-15 NOTE — Patient Instructions (Signed)
Stressed the importance of management of risk factors to prevent further stroke Continue Plavix and aspirin for secondary stroke prevention/TIA Maintain strict control of hypertension with blood pressure goal below 130/90, today's reading 110/73 Cholesterol with LDL cholesterol less than 70, followed by primary care,  most recent 146  continue statin drug Crestor Headaches are stable at present Exercise by walking, 30 min daily  eat healthy diet with whole grains,  fresh fruits and vegetables Follow-up with primary care for stroke risk factor modification, maintain blood pressure goal less than 025 systolic, diabetes with G8Y below 7, lipids with LDL below 70 F/U in 6 months

## 2017-07-15 NOTE — Progress Notes (Signed)
[facility]  PATIENT: [name] DOB: 06-03-1970   REASON FOR VISIT: Follow-up for cerebrovascular disease, headaches, bilateral carotid artery occlusion, TIA HISTORY FROM: Patient    HISTORY OF PRESENT ILLNESS:UPDATE 4/4/2019CM [name], 47 year old female returns for follow-up with significant history of cerebrovascular disease and known bilateral internal carotid artery occlusions.  She has had TIA events in the past.  She also has a history of headaches.  She is currently on Plavix and aspirin for secondary stroke prevention without further TIA or stroke event.  She remains on Crestor for hyperlipidemia .  ER visit in February for headache.  She was taken off Topamax because of side effects.  She has not had a headache since and occasionally takes Aleve if necessary. CT angiogram 03/03/17  appears to show occlusion of both carotid arteries, this is a known issue.  The posterior circulation appears to be unremarkable.  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  The patient continues to smoke.  She claims she is trying to quit she smokes a pack a day.  Patient denies any persistent numbness or weakness in the face arms or legs she denies any balance issues or falls.  She denies any problems controlling bowels or bladder.  She gets little exercise and was encouraged to do so she returns for reevaluation   02/22/17 [name] is a 47 year old left-handed white female with a history of ongoing tobacco abuse and history of significant cerebrovascular disease.  The patient has known bilateral internal carotid artery occlusion, she has had a TIA and strokelike events in the past.  The patient does have a prior history of headaches that had improved significantly until around August 2018.  The headaches have come back again and are occurring 4 or 5 times a month.  The headaches may wake her up from sleep, she generally will take Aleve  usually with good improvement but this medication does not help all the time.  The patient denies any nausea or vomiting with the headache, she does have photophobia without phonophobia.  She may see spots in front of her eyes with a headache.  The patient has also had 2 recent TIA type events both associated with nausea and vomiting.  About 5 minutes after she vomited she noted episodes of right arm weakness that will last 5 or 10 minutes and then clear.  The patient last had an event about 1 month ago.  The patient denies any persistent numbness or weakness of the face, arms, or legs.  She denies any difficulty with balance or difficulty controlling the bowels of the bladder.  The patient again continues to smoke cigarettes.  She comes to the office today for an evaluation.   REVIEW OF SYSTEMS: Full 14 system review of systems performed and notable only for those listed, all others are neg:  Constitutional: neg  Cardiovascular: neg Ear/Nose/Throat: neg  Skin: neg Eyes: neg Respiratory: neg Gastroitestinal: neg  Hematology/Lymphatic: neg  Endocrine: neg Musculoskeletal:neg Allergy/Immunology: neg Neurological: neg Psychiatric: neg Sleep : neg   ALLERGIES: Allergies  Allergen Reactions  . Pantoprazole Sodium Itching and Rash  . Penicillins Itching and Rash    Has patient had a PCN reaction causing immediate rash, facial/tongue/throat swelling, SOB or lightheadedness with hypotension: Yes Has patient had a PCN reaction causing severe rash involving mucus membranes or skin necrosis: No Has patient had a PCN reaction that required hospitalization: No Has patient had a PCN reaction  occurring within the last 10 years: No If all of the above answers are "NO", then may proceed with Cephalosporin use.   . Sulfa Antibiotics Itching and Rash  . Codeine Nausea And Vomiting  . Demerol [Meperidine]     Red streaks on arm after coadministration of phenergan and demerol at time of EGD in 2012.  Tolerated phenergan with subsequent EGD.    HOME MEDICATIONS: Outpatient Medications Prior to Visit  Medication Sig Dispense Refill  . aspirin EC 81 MG tablet Take 81 mg by mouth daily.    . clopidogrel (PLAVIX) 75 MG tablet Take 1 tablet (75 mg total) by mouth daily. 30 tablet 0  . estradiol (ESTRACE) 2 MG tablet TAKE 1 TABLET BY MOUTH ONCE DAILY. 30 tablet 11  . Icosapent Ethyl (VASCEPA) 1 G CAPS Take 2 g by mouth 2 (two) times daily.     . lansoprazole (PREVACID) 30 MG capsule Take 1 capsule (30 mg total) by mouth daily before breakfast. 30 capsule 11  . levothyroxine (SYNTHROID, LEVOTHROID) 50 MCG tablet Take 1 tablet (50 mcg total) by mouth daily. 90 tablet 2  . Multiple Vitamin (MULTIVITAMIN) tablet Take 1 tablet by mouth daily.    . naproxen sodium (ALEVE) 220 MG tablet Take 220 mg by mouth daily as needed (pain).    . ranitidine (ZANTAC) 300 MG tablet TAKE ONE TABLET BY MOUTH AT BEDTIME. 30 tablet 11  . rosuvastatin (CRESTOR) 20 MG tablet Take 20 mg by mouth daily.    . Alpha-D-Galactosidase (BEANO) TABS Take 1 tablet by mouth as needed (gas).     No facility-administered medications prior to visit.     PAST MEDICAL HISTORY: Past Medical History:  Diagnosis Date  . Bleeding ulcer   . Bronchitis    hx of  . Carotid artery disease ([place])    bilateral internal carotid artery occlusion by duplex ultrasound  . Cervical cancer ([facility])    cervical  . Chronic headaches   . Common migraine with intractable migraine 02/22/2017  . GERD (gastroesophageal reflux disease)   . Hyperlipidemia   . Hypothyroidism   . Pituitary tumor   . PUD (peptic ulcer disease)    NSAIDS   . Stenosis of right carotid artery   . Stroke ([place])    mini-stroke; memory deficits, slight  . TIA (transient ischemic attack)     PAST SURGICAL HISTORY: Past Surgical History:  Procedure Laterality Date  . ABDOMINAL HYSTERECTOMY    . BIOPSY  02/23/2017   Procedure: BIOPSY;  Surgeon: Danie Binder, MD;   [facility]: [facility];  Service: Endoscopy;;  duodenal bx's  . BLADDER SUSPENSION    . ESOPHAGOGASTRODUODENOSCOPY  02/2010   Dr. Barney Drain. Multiple duodenal ulcers, gastric erosions (bx negative for H.Pylori)  . ESOPHAGOGASTRODUODENOSCOPY N/A 06/19/2013   SLF: 1. Earlene Plater /dyspepsia due to uncontrolled GERD, & NSAID gastritis/duodenitis.  Marland Kitchen ESOPHAGOGASTRODUODENOSCOPY (EGD) WITH PROPOFOL N/A 02/23/2017   Procedure: ESOPHAGOGASTRODUODENOSCOPY (EGD) WITH PROPOFOL;  Surgeon: Danie Binder, MD;  Location: [facility];  Service: Endoscopy;  Laterality: N/A;  8:15am  . Multiple cyst removal     coccyx, left ring finger, pelvic area & throat/vocal cord and pilonidal cyst  . RADIOLOGY WITH ANESTHESIA  03/23/2012   Procedure: RADIOLOGY WITH ANESTHESIA;  Surgeon: Rob Hickman, MD;  Location: [place];  Service: Radiology;  Laterality: N/A;  . TOTAL VAGINAL HYSTERECTOMY      FAMILY HISTORY: Family History  Problem Relation Age of Onset  . Diabetes Mother   .  COPD Mother   . Heart disease Father   . Cervical cancer Sister   . Colon cancer Neg Hx   . Colon polyps Neg Hx     SOCIAL HISTORY: Social History   Socioeconomic History  . Marital status: Married    Spouse name: Sherren Mocha  . Number of children: 3  . Years of education: 61  . Highest education level: Not on file  Occupational History  . Occupation: unemployed    Fish farm manager: UNEMPLOYED  Social Needs  . Financial resource strain: Not on file  . Food insecurity:    Worry: Not on file    Inability: Not on file  . Transportation needs:    Medical: Not on file    Non-medical: Not on file  Tobacco Use  . Smoking status: Current Every Day Smoker    Packs/day: 1.00    Years: 25.00    Pack years: 25.00    Types: Cigarettes  . Smokeless tobacco: Never Used  Substance and Sexual Activity  . Alcohol use: Yes    Alcohol/week: 8.4 - 16.8 oz    Types: 14 - 28 Cans of beer per week    Comment: drinks 2-4 beers a day; occassional    . Drug use: No  . Sexual activity: Yes    Partners: Male    Birth control/protection: Surgical    Comment: spouse  Lifestyle  . Physical activity:    Days per week: Not on file    Minutes per session: Not on file  . Stress: Not on file  Relationships  . Social connections:    Talks on phone: Not on file    Gets together: Not on file    Attends religious service: Not on file    Active member of club or organization: Not on file    Attends meetings of clubs or organizations: Not on file    Relationship status: Not on file  . Intimate partner violence:    Fear of current or ex partner: Not on file    Emotionally abused: Not on file    Physically abused: Not on file    Forced sexual activity: Not on file  Other Topics Concern  . Not on file  Social History Narrative   Lives with spouse and kids   Caffeine use:      PHYSICAL EXAM  Vitals:   07/15/17 1114  BP: 110/73  Pulse: 78  Weight: 109 lb (49.4 kg)  Height: 5\' 2"  (1.575 m)   Body mass index is 19.94 kg/m.  Generalized: Well developed, in no acute distress  Head: normocephalic and atraumatic,. Oropharynx benign  Neck: Supple, no carotid bruits  Cardiac: Regular rate rhythm, no murmur  Musculoskeletal: No deformity   Neurological examination   Mentation: Alert oriented to time, place, history taking. Attention span and concentration appropriate. Recent and remote memory intact.  Follows all commands speech and language fluent.   Cranial nerve II-XII: Fundoscopic exam reveals sharp disc margins.Pupils were equal round reactive to light extraocular movements were full, visual field were full on confrontational test. Facial sensation and strength were normal. hearing was intact to finger rubbing bilaterally. Uvula tongue midline. head turning and shoulder shrug were normal and symmetric.Tongue protrusion into cheek strength was normal. Motor: normal bulk and tone, full strength in the BUE, BLE,  Sensory: normal and  symmetric to light touch, pinprick, and  Vibration, in the upper and lower extremities, no evidence of extinction Coordination: finger-nose-finger, heel-to-shin bilaterally, no dysmetria, no tremor  Reflexes: Brachioradialis 2/2, biceps 2/2, triceps 2/2, patellar 2/2, Achilles 2/2, plantar responses were flexor bilaterally. Gait and Station: Rising up from seated position without assistance, normal stance,  moderate stride, good arm swing, smooth turning, able to perform tiptoe, and heel walking without difficulty. Tandem gait is steady  DIAGNOSTIC DATA (LABS, IMAGING, TESTING) - I reviewed patient records, labs, notes, testing and imaging myself where available.  Lab Results  Component Value Date   WBC 12.8 (H) 07/02/2017   HGB 11.6 (L) 07/02/2017   HCT 35.4 (L) 07/02/2017   MCV 91.0 07/02/2017   PLT 369 07/02/2017      Component Value Date/Time   NA 140 07/02/2017 1407   K 3.5 07/02/2017 1407   CL 108 07/02/2017 1407   CO2 22 07/02/2017 1407   GLUCOSE 96 07/02/2017 1407   BUN 11 07/02/2017 1407   BUN 8 05/18/2017   CREATININE 0.60 07/02/2017 1407   CALCIUM 9.3 07/02/2017 1407   PROT 7.5 07/02/2017 1407   ALBUMIN 4.0 07/02/2017 1407   AST 17 07/02/2017 1407   ALT 10 (L) 07/02/2017 1407   ALKPHOS 72 07/02/2017 1407   BILITOT 0.5 07/02/2017 1407   GFRNONAA >60 07/02/2017 1407   GFRAA >60 07/02/2017 1407    Lab Results  Component Value Date   HGBA1C 5.3 12/28/2016   Lab Results  Component Value Date   [id number] 701 02/05/2016   Lab Results  Component Value Date   TSH 0.68 12/28/2016      ASSESSMENT AND PLAN  47 y.o. year old female here to follow-up for 1Common migraine headache 2.  Cerebrovascular disease, bilateral carotid artery occlusion 3.  TIA events CT angiogram 03/03/17  appears to show occlusion of both carotid arteries, this is a known issue.  The posterior circulation appears to be unremarkable.  The patient appears to have a moyamoya pattern of  circulation in the anterior circulation.    PLAN: Stressed the importance of management of risk factors to prevent further stroke Continue Plavix and aspirin for secondary stroke prevention/TIA Maintain strict control of hypertension with blood pressure goal below 130/90, today's reading 110/73 Cholesterol with LDL cholesterol less than 70, followed by primary care,  continue statin drug Crestor Headaches are stable at present Exercise by walking, 30 min daily Stop smoking  eat healthy diet with whole grains,  fresh fruits and vegetables Follow-up with primary care for stroke risk factor modification, maintain blood pressure goal less than 779 systolic, diabetes with T9Q below 7, lipids with LDL below 70 F/U in 6 months I spent 25 minutes in total face to face time with the patient more than 50% of which was spent counseling and coordination of care, reviewing test results reviewing medications and discussing and reviewing the diagnosis of stroke/TIA and management of risk factors Dennie Bible, [facility], [facility], APRN  [facility] [address], [place], [place] [address] [phone]

## 2017-07-15 NOTE — Progress Notes (Signed)
I have read the note, and I agree with the clinical assessment and plan.  Rondia Higginbotham K Charmon Thorson   

## 2017-07-16 ENCOUNTER — Encounter (HOSPITAL_COMMUNITY): Payer: Self-pay | Admitting: Internal Medicine

## 2017-07-16 ENCOUNTER — Other Ambulatory Visit: Payer: Self-pay

## 2017-07-16 ENCOUNTER — Inpatient Hospital Stay (HOSPITAL_COMMUNITY): Payer: BLUE CROSS/BLUE SHIELD | Attending: Internal Medicine | Admitting: Internal Medicine

## 2017-07-16 VITALS — BP 119/74 | HR 76 | Temp 98.0°F | Resp 20 | Ht 62.0 in | Wt 109.8 lb

## 2017-07-16 DIAGNOSIS — D352 Benign neoplasm of pituitary gland: Secondary | ICD-10-CM | POA: Insufficient documentation

## 2017-07-16 DIAGNOSIS — D72829 Elevated white blood cell count, unspecified: Secondary | ICD-10-CM | POA: Diagnosis not present

## 2017-07-16 DIAGNOSIS — E2740 Unspecified adrenocortical insufficiency: Secondary | ICD-10-CM | POA: Insufficient documentation

## 2017-07-16 DIAGNOSIS — R899 Unspecified abnormal finding in specimens from other organs, systems and tissues: Secondary | ICD-10-CM | POA: Diagnosis not present

## 2017-07-16 DIAGNOSIS — Z809 Family history of malignant neoplasm, unspecified: Secondary | ICD-10-CM | POA: Diagnosis not present

## 2017-07-16 DIAGNOSIS — Z87891 Personal history of nicotine dependence: Secondary | ICD-10-CM | POA: Insufficient documentation

## 2017-07-16 NOTE — Progress Notes (Signed)
Diagnosis Abnormal laboratory test - Plan: CT BONE MARROW BIOPSY & ASPIRATION, CT Biopsy  Staging Cancer Staging No matching staging information was found for the patient.  Assessment and Plan: 1.  Leukocytosis - chronic since 2012 .  The patient [facility] until she [facility] in 2019.  Previous workup included  flow cytometry which did not show any clonal lymphocyte population, jak 2 testing was negative and BCR ABL testing was negative in 2012.  She had labs done with Dr. Talbert Cage on 05/18/2017 which showed a white count 14.8 hemoglobin 11.9 platelets were 447,000.  No significant abnormalities were noted on differential.    She has ongoing history of smoking and this is felt to be contributing to her ongoing mild leukocytosis.  Pt underwent repeat BCR/ABL testing that was positive on 07/02/2017.   Interpretation [facility]):  ABL1 e13a2 (b2a2, p210) fusion transcript.   Comment: Comment  POSITIVE for the BCR    Jak 2 testing was negative.   She has a normal sed rate.   C-reactive protein slightly elevated at 1.9.   I count 12.8 hemoglobin 11.6 platelets 369,000.  LDH is normal at 146.  I will also contact the lab regarding the reported negative BCR/ABL  in 2012.  I have discussed with the patient that new technologies now are more sensitive for the detection of these genetic abnormalities.  She will be set up for bone marrow aspirate and biopsy for definitive diagnosis.  Flow cytometry, cytogenetics and FISH for CML have been ordered.  She will return to clinic in 2 weeks to go over bone marrow biopsy results.    2.   Pituitary adenoma/prolactinoma with adrenal insufficiency.  Continue to follow-up with endocrinology as recommended.  She reports she is no longer on prednisone.  3.  Smoking.  She reports she has quit smoking.    4.  Family history of various cancer.    She reports a strong family history of breast cancer.  The patient has undergone screening mammogram in December  2018 that was negative.  She should have repeat mammogram imaging in December 2019.  Interval History: 47 year old female previously seen at [facility] in [facility].  She had a negative workup with normal flow cytometry, Jak 2 testing and BCR/ABL.  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  She was previously on prednisone but reports she is no longer taking prednisone.  She continues to smoke.  Current Status: Patient seen today for follow-up.  She is here to go over lab results.   She denies any fever, chills, night sweats, and has noted no adenopathy.   Problem List Patient Active Problem List   Diagnosis Date Noted  . Headache [R51] 07/15/2017  . TIA (transient ischemic attack) [G45.9] 07/15/2017  . Bloating [R14.0]   . Early satiety [R68.81]   . Common migraine with intractable migraine [G43.019] 02/22/2017  . GERD (gastroesophageal reflux disease) [K21.9] 01/18/2017  . Hyperthyroidism [E05.90] 06/15/2016  . Other specified hypothyroidism [E03.8] 06/15/2016  . Adrenal insufficiency ([place]) [E27.40] 06/15/2016  . Hyperprolactinemia ([place]) [E22.1] 06/15/2016  . Pituitary microadenoma ([place]) [D35.2] 06/15/2016  . Dyspepsia [R10.13] 05/31/2013  . Hyperlipidemia [E78.5] 09/15/2012  . Carotid artery disease ([place]) [I77.9]   . PUD (peptic ulcer disease) [K27.9] 07/16/2010    Past Medical History Past Medical History:  Diagnosis Date  . Bleeding ulcer   . Bronchitis    hx of  . 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 Cervical cancer ([facility])    cervical  . Chronic headaches   . Common migraine with intractable migraine 02/22/2017  . GERD (gastroesophageal reflux disease)   . Hyperlipidemia   . Hypothyroidism   . Pituitary tumor   . PUD (peptic ulcer disease)    NSAIDS   . Stenosis of right carotid artery   . Stroke ([place])    mini-stroke; memory deficits, slight  . TIA (transient ischemic  attack)     Past Surgical History Past Surgical History:  Procedure Laterality Date  . ABDOMINAL HYSTERECTOMY    . BIOPSY  02/23/2017   Procedure: BIOPSY;  Surgeon: Danie Binder, MD;  [facility];  Service: Endoscopy;;  duodenal bx's  . BLADDER SUSPENSION    . ESOPHAGOGASTRODUODENOSCOPY  02/2010   Dr. Barney Drain. Multiple duodenal ulcers, gastric erosions (bx negative for H.Pylori)  . ESOPHAGOGASTRODUODENOSCOPY N/A 06/19/2013   SLF: 1. Earlene Plater /dyspepsia due to uncontrolled GERD, & NSAID gastritis/duodenitis.  Marland Kitchen ESOPHAGOGASTRODUODENOSCOPY (EGD) WITH PROPOFOL N/A 02/23/2017   Procedure: ESOPHAGOGASTRODUODENOSCOPY (EGD) WITH PROPOFOL;  Surgeon: Danie Binder, MD;  [facility];  Service: Endoscopy;  Laterality: N/A;  8:15am  . Multiple cyst removal     coccyx, left ring finger, pelvic area & throat/vocal cord and pilonidal cyst  . RADIOLOGY WITH ANESTHESIA  03/23/2012   Procedure: RADIOLOGY WITH ANESTHESIA;  Surgeon: Rob Hickman, MD;  Location: [facility];  Service: Radiology;  Laterality: N/A;  . TOTAL VAGINAL HYSTERECTOMY      Family History Family History  Problem Relation Age of Onset  . Diabetes Mother   . COPD Mother   . Heart disease Father   . Cervical cancer Sister   . Colon cancer Neg Hx   . Colon polyps Neg Hx      Social History  reports that she has been smoking cigarettes.  She has a 25.00 pack-year smoking history. She has never used smokeless tobacco. She reports that she drinks about 8.4 - 16.8 oz of alcohol per week. She reports that she does not use drugs.  Medications  Current Outpatient Medications:  .  aspirin EC 81 MG tablet, Take 81 mg by mouth daily., Disp: , Rfl:  .  clopidogrel (PLAVIX) 75 MG tablet, Take 1 tablet (75 mg total) by mouth daily., Disp: 30 tablet, Rfl: 0 .  estradiol (ESTRACE) 2 MG tablet, TAKE 1 TABLET BY MOUTH ONCE DAILY., Disp: 30 tablet, Rfl: 11 .  Icosapent Ethyl (VASCEPA) 1 G CAPS, Take 2 g by mouth 2  (two) times daily. , Disp: , Rfl:  .  lansoprazole (PREVACID) 30 MG capsule, Take 1 capsule (30 mg total) by mouth daily before breakfast., Disp: 30 capsule, Rfl: 11 .  levothyroxine (SYNTHROID, LEVOTHROID) 50 MCG tablet, Take 1 tablet (50 mcg total) by mouth daily., Disp: 90 tablet, Rfl: 2 .  Multiple Vitamin (MULTIVITAMIN) tablet, Take 1 tablet by mouth daily., Disp: , Rfl:  .  naproxen sodium (ALEVE) 220 MG tablet, Take 220 mg by mouth daily as needed (pain)., Disp: , Rfl:  .  ranitidine (ZANTAC) 300 MG tablet, TAKE ONE TABLET BY MOUTH AT BEDTIME., Disp: 30 tablet, Rfl: 11 .  rosuvastatin (CRESTOR) 20 MG tablet, Take 20 mg by mouth daily., Disp: , Rfl:   Allergies Pantoprazole sodium; Penicillins; Sulfa antibiotics; Codeine; and Demerol [meperidine]  Review of Systems Review of Systems - Oncology ROS as per HPI otherwise 12 point ROS is  negative.   Physical Exam  Vitals Wt Readings from Last 3 Encounters:  07/16/17 109 lb 12.8 oz (49.8 kg)  07/15/17 109 lb (49.4 kg)  07/02/17 109 lb 14.4 oz (49.9 kg)   Temp Readings from Last 3 Encounters:  07/16/17 98 F (36.7 C) (Oral)  07/02/17 98.2 F (36.8 C) (Oral)  05/17/17 (!) 97.5 F (36.4 C) (Oral)   BP Readings from Last 3 Encounters:  07/16/17 119/74  07/15/17 110/73  07/02/17 109/71   Pulse Readings from Last 3 Encounters:  07/16/17 76  07/15/17 78  07/02/17 84   Constitutional: Well-developed, well-nourished, and in no distress.   HENT: Head: Normocephalic and atraumatic.  Mouth/Throat: No oropharyngeal exudate. Mucosa moist. Eyes: Pupils are equal, round, and reactive to light. Conjunctivae are normal. No scleral icterus.  Neck: Normal range of motion. Neck supple. No JVD present.  Cardiovascular: Normal rate, regular rhythm and normal heart sounds.  Exam reveals no gallop and no friction rub.   No murmur heard. Pulmonary/Chest: Effort normal and breath sounds normal. No respiratory distress. No wheezes.No rales.   Abdominal: Soft. Bowel sounds are normal. No distension. There is no tenderness. There is no guarding.  Musculoskeletal: No edema or tenderness.  Lymphadenopathy: No cervical, axillary  or supraclavicular adenopathy.  Neurological: Alert and oriented to person, place, and time. No cranial nerve deficit.  Skin: Skin is warm and dry. No rash noted. No erythema. No pallor.  Psychiatric: Affect and judgment normal.   Labs No visits with results within 3 Day(s) from this visit.  Latest known visit with results is:  Appointment on 07/02/2017  Component Date Value Ref Range Status  . WBC 07/02/2017 12.8* 4.0 - 10.5 K/uL Final  . RBC 07/02/2017 3.89  3.87 - 5.11 MIL/uL Final  . Hemoglobin 07/02/2017 11.6* 12.0 - 15.0 g/dL Final  . HCT 07/02/2017 35.4* 36.0 - 46.0 % Final  . MCV 07/02/2017 91.0  78.0 - 100.0 fL Final  . MCH 07/02/2017 29.8  26.0 - 34.0 pg Final  . MCHC 07/02/2017 32.8  30.0 - 36.0 g/dL Final  . RDW 07/02/2017 12.7  11.5 - 15.5 % Final  . Platelets 07/02/2017 369  150 - 400 K/uL Final  . Neutrophils Relative % 07/02/2017 48  % Final  . Neutro Abs 07/02/2017 6.2  1.7 - 7.7 K/uL Final  . Lymphocytes Relative 07/02/2017 42  % Final  . Lymphs Abs 07/02/2017 5.4* 0.7 - 4.0 K/uL Final  . Monocytes Relative 07/02/2017 6  % Final  . Monocytes Absolute 07/02/2017 0.8  0.1 - 1.0 K/uL Final  . Eosinophils Relative 07/02/2017 3  % Final  . Eosinophils Absolute 07/02/2017 0.3  0.0 - 0.7 K/uL Final  . Basophils Relative 07/02/2017 1  % Final  . Basophils Absolute 07/02/2017 0.1  0.0 - 0.1 K/uL Final   Performed at [facility], [address] [place],  [address]  . Sodium 07/02/2017 140  135 - 145 mmol/L Final  . Potassium 07/02/2017 3.5  3.5 - 5.1 mmol/L Final  . Chloride 07/02/2017 108  101 - 111 mmol/L Final  . CO2 07/02/2017 22  22 - 32 mmol/L Final  . Glucose, Bld 07/02/2017 96  65 - 99 mg/dL Final  . BUN 07/02/2017 11  6 - 20 mg/dL Final  . Creatinine, Ser 07/02/2017  0.60  0.44 - 1.00 mg/dL Final  . Calcium 07/02/2017 9.3  8.9 - 10.3 mg/dL Final  . Total Protein 07/02/2017 7.5  6.5 - 8.1 g/dL Final  .  Albumin 07/02/2017 4.0  3.5 - 5.0 g/dL Final  . AST 07/02/2017 17  15 - 41 U/L Final  . ALT 07/02/2017 10* 14 - 54 U/L Final  . Alkaline Phosphatase 07/02/2017 72  38 - 126 U/L Final  . Total Bilirubin 07/02/2017 0.5  0.3 - 1.2 mg/dL Final  . GFR calc non Af Amer 07/02/2017 >60  >60 mL/min Final  . GFR calc Af Amer 07/02/2017 >60  >60 mL/min Final   Comment: (NOTE) The eGFR has been calculated using the CKD EPI equation. This calculation has not been validated in all clinical situations. eGFR's persistently <60 mL/min signify possible Chronic Kidney Disease.   Georgiann Hahn gap 07/02/2017 10  5 - 15 Final   Performed at [facility], [address] [place], [place] [address]  . LDH 07/02/2017 146  98 - 192 U/L Final   Performed at [facility], [address], [place], [place] [address]  . JAK2 GenotypR 07/02/2017 Comment   Final   Comment: (NOTE) Result: NEGATIVE for the JAK2 V617F mutation. Interpretation:  The G to T nucleotide change encoding the V617F mutation was not detected.  This result does not rule out the presence of the JAK2 mutation at a level below the sensitivity of detection of this assay, or the presence of other mutations within JAK2 not detected by this assay.  This result does not rule out a diagnosis of polycythemia vera, essential thrombocythemia or idiopathic myelofibrosis as the V617F mutation is not detected in all patients with these disorders.   . Director Review, JAK2 07/02/2017 Comment   Corrected   Comment: (NOTE) Constance Goltz, PhD, [facility]               Director, [place] for [place], [place]               [phone] This test was developed and its performance characteristics determined by [facility]. It has not been  cleared or approved by the Food and Drug Administration. Performed At: [facility] [address] [place], [place] [id number] Nechama Guard MD TD:[id number] Performed At: [facility] [place], [place] [id number] Nechama Guard MD OE:[id number]   . BACKGROUND: 07/02/2017 Comment   Corrected   Comment: (NOTE) JAK2 is a cytoplasmic tyrosine kinase with a key role in signal transduction from multiple hematopoietic growth factor receptors. A point mutation within exon 14 of the JAK2 gene ([id number]) encoding a valine to phenylalanine substitution at position 617 of the JAK2 protein (V617F) has been identified in most patients with polycythemia vera, and in about half of those with either essential thrombocythemia or idiopathic myelofibrosis. The V617F has also been detected, although infrequently, in other myeloid disorders such as chronic myelomonocytic leukemia and chronic neutrophilic luekemia. V617F is an acquired mutation that alters a highly conserved valine present in the negative regulatory JH2 domain of the JAK2 protein and is predicted to dysregulate kinase activity. Methodology: Total genomic DNA was extracted and subjected to TaqMan real-time PCR amplification/detection. Two amplification products per sample were monitored by real-time PCR using primers/probes s  pecific to JAK2 wild type (WT) and JAK2 mutant V617F. The ABI7900 Absolute Quantitation software will compare the patient specimen valuse to the standard curves and generate percent values for wild type and mutant type. In vitro studies have indicated that this assay has an analytical sensitivity of 1%. References: Baxter EJ, Scott Phineas Real, et al. Acquired mutation of the tyrosine kinase JAK2 in human myeloproliferative disorders. Lancet. 2005 Mar 19-25; 365(9464):1054-1061. Alfonso Ramus Couedic JP. A unique clonal JAK2 mutation leading to  constitutive signaling causes polycythaemia vera. Nature. 2005 Apr 28; 434(7037):1144-1148. Kralovics R, Passamonti F, Buser AS, et al. A gain-of-function mutation of JAK2 in myeloproliferative disorders. N Engl J Med. 2005 Apr 28; 352(17):1779-1790. Performed at [facility], [address] [place], [place] [address]   . Sed Rate 07/02/2017 22  0 - 22 mm/hr Final   Performed at [facility], [address], [place], [place] [address]  . CRP 07/02/2017 1.9* <1.0 mg/dL Final   Performed at [facility] [address], [place], [place] [address]     Pathology Orders Placed This Encounter  Procedures  . CT BONE MARROW BIOPSY & ASPIRATION    Pt with mild leucocytosis.    BCR?ABL +    Send flow cytometry and cytogenetics and Fish for CML    Standing Status:   Future    Standing Expiration Date:   10/16/2018    Order Specific Question:   Reason for Exam (SYMPTOM  OR DIAGNOSIS REQUIRED)    Answer:   + BCR/ABL, leukocytosis    Order Specific Question:   Is patient pregnant?    Answer:   No    Order Specific Question:   Preferred imaging location?    Answer:   [facility]    Order Specific Question:   Radiology Contrast Protocol - do NOT remove file path    Answer:   \\[email or web address]  . CT Biopsy    Standing Status:   Future    Standing Expiration Date:   07/16/2018    Order Specific Question:   Lab orders requested (DO NOT place separate lab orders, these will be automatically ordered during procedure specimen collection):    Answer:   Surgical Pathology    Comments:   [facility] for [facility]    Order Specific Question:   Lab orders requested (DO NOT place separate lab orders, these will be automatically ordered during procedure specimen collection):    Answer:   Other    Order Specific Question:   Reason for Exam (SYMPTOM  OR DIAGNOSIS REQUIRED)    Answer:   + bcr/abl, leukocytosis    Order Specific Question:   Is patient pregnant?    Answer:   No     Order Specific Question:   Preferred imaging location?    Answer:   [facility]    Order Specific Question:   Radiology Contrast Protocol - do NOT remove file path    Answer:   \\[email or web address]       Zoila Shutter MD

## 2017-07-16 NOTE — Patient Instructions (Addendum)
[place] at [facility] Discharge Instructions  Today you was seen by Dr. Walden Field.    Thank you for choosing [facility] to provide your oncology and hematology care.  To afford each patient quality time with our provider, please arrive at least 15 minutes before your scheduled appointment time.   If you have a lab appointment with the [facility] please come in thru the  Main Entrance and check in at the main information desk  You need to re-schedule your appointment should you arrive 10 or more minutes late.  We strive to give you quality time with our providers, and arriving late affects you and other patients whose appointments are after yours.  Also, if you no show three or more times for appointments you may be dismissed from the clinic at the providers discretion.     Again, thank you for choosing [facility].  Our hope is that these requests will decrease the amount of time that you wait before being seen by our physicians.       _____________________________________________________________  Should you have questions after your visit to [facility], please contact our office at [phone] between the hours of 8:30 a.m. and 4:30 p.m.  Voicemails left after 4:30 p.m. will not be returned until the following business day.  For prescription refill requests, have your pharmacy contact our office.       Resources For Cancer Patients and their Caregivers ? American Cancer Society: Can assist with transportation, wigs, general needs, runs Look Good Feel Better.        616-869-3102 ? Cancer Care: Provides financial assistance, online support groups, medication/co-pay assistance.  1-800-813-HOPE 347-821-4054) ? [facility] Assists [place] cancer patients and their families through emotional , educational and financial support.  339-621-3658 ? [place] DSS Where to apply  for food stamps, Medicaid and utility assistance. (313)772-0321 ? [facility]: Transportation to medical appointments. 867-507-5808 ? Social Security Administration: May apply for disability if have a Stage IV cancer. (423)039-3934 (469) 497-7041 ? [employer], Disability and Transit Services: Assists with nutrition, care and transit needs. [place] Support Programs:   > Cancer Support Group  2nd Tuesday of the month 1pm-2pm, Journey Room   > Creative Journey  3rd Tuesday of the month 1130am-1pm, Journey Room

## 2017-08-03 ENCOUNTER — Other Ambulatory Visit: Payer: Self-pay | Admitting: Radiology

## 2017-08-04 ENCOUNTER — Ambulatory Visit (HOSPITAL_COMMUNITY)
Admission: RE | Admit: 2017-08-04 | Discharge: 2017-08-04 | Disposition: A | Discharge status: Home / Self Care | Payer: BLUE CROSS/BLUE SHIELD | Source: Ambulatory Visit | Attending: Internal Medicine | Admitting: Internal Medicine

## 2017-08-04 ENCOUNTER — Encounter (HOSPITAL_COMMUNITY): Payer: Self-pay

## 2017-08-04 DIAGNOSIS — I69311 Memory deficit following cerebral infarction: Secondary | ICD-10-CM | POA: Diagnosis not present

## 2017-08-04 DIAGNOSIS — R7989 Other specified abnormal findings of blood chemistry: Secondary | ICD-10-CM | POA: Insufficient documentation

## 2017-08-04 DIAGNOSIS — K219 Gastro-esophageal reflux disease without esophagitis: Secondary | ICD-10-CM | POA: Diagnosis not present

## 2017-08-04 DIAGNOSIS — D72829 Elevated white blood cell count, unspecified: Secondary | ICD-10-CM | POA: Insufficient documentation

## 2017-08-04 DIAGNOSIS — E039 Hypothyroidism, unspecified: Secondary | ICD-10-CM | POA: Insufficient documentation

## 2017-08-04 DIAGNOSIS — Z7982 Long term (current) use of aspirin: Secondary | ICD-10-CM | POA: Diagnosis not present

## 2017-08-04 DIAGNOSIS — Z79890 Hormone replacement therapy (postmenopausal): Secondary | ICD-10-CM | POA: Insufficient documentation

## 2017-08-04 DIAGNOSIS — Z79899 Other long term (current) drug therapy: Secondary | ICD-10-CM | POA: Diagnosis not present

## 2017-08-04 DIAGNOSIS — Z7902 Long term (current) use of antithrombotics/antiplatelets: Secondary | ICD-10-CM | POA: Insufficient documentation

## 2017-08-04 DIAGNOSIS — Z8541 Personal history of malignant neoplasm of cervix uteri: Secondary | ICD-10-CM | POA: Insufficient documentation

## 2017-08-04 DIAGNOSIS — D7589 Other specified diseases of blood and blood-forming organs: Secondary | ICD-10-CM | POA: Diagnosis not present

## 2017-08-04 DIAGNOSIS — E785 Hyperlipidemia, unspecified: Secondary | ICD-10-CM | POA: Diagnosis not present

## 2017-08-04 DIAGNOSIS — F17210 Nicotine dependence, cigarettes, uncomplicated: Secondary | ICD-10-CM | POA: Insufficient documentation

## 2017-08-04 DIAGNOSIS — Z8589 Personal history of malignant neoplasm of other organs and systems: Secondary | ICD-10-CM | POA: Insufficient documentation

## 2017-08-04 DIAGNOSIS — R899 Unspecified abnormal finding in specimens from other organs, systems and tissues: Secondary | ICD-10-CM

## 2017-08-04 LAB — CBC WITH DIFFERENTIAL/PLATELET
Basophils Absolute: 0.1 10*3/uL (ref 0.0–0.1)
Basophils Relative: 1 %
EOS ABS: 0.6 10*3/uL (ref 0.0–0.7)
Eosinophils Relative: 4 %
HCT: 36.6 % (ref 36.0–46.0)
HEMOGLOBIN: 12.2 g/dL (ref 12.0–15.0)
Lymphocytes Relative: 36 %
Lymphs Abs: 5.9 10*3/uL — ABNORMAL HIGH (ref 0.7–4.0)
MCH: 30.2 pg (ref 26.0–34.0)
MCHC: 33.3 g/dL (ref 30.0–36.0)
MCV: 90.6 fL (ref 78.0–100.0)
Monocytes Absolute: 1.1 10*3/uL — ABNORMAL HIGH (ref 0.1–1.0)
Monocytes Relative: 7 %
NEUTROS PCT: 52 %
Neutro Abs: 8.6 10*3/uL — ABNORMAL HIGH (ref 1.7–7.7)
Platelets: 409 10*3/uL — ABNORMAL HIGH (ref 150–400)
RBC: 4.04 MIL/uL (ref 3.87–5.11)
RDW: 13.1 % (ref 11.5–15.5)
WBC: 16.3 10*3/uL — ABNORMAL HIGH (ref 4.0–10.5)

## 2017-08-04 LAB — PROTIME-INR
INR: 0.91
PROTHROMBIN TIME: 12.2 s (ref 11.4–15.2)

## 2017-08-04 MED ORDER — MIDAZOLAM HCL 2 MG/2ML IJ SOLN
INTRAMUSCULAR | Status: AC
  Filled 2017-08-04: qty 6

## 2017-08-04 MED ORDER — FENTANYL CITRATE (PF) 100 MCG/2ML IJ SOLN
INTRAMUSCULAR | Status: AC | PRN
  Administered 2017-08-04 (×2): 50 ug via INTRAVENOUS

## 2017-08-04 MED ORDER — MIDAZOLAM HCL 2 MG/2ML IJ SOLN
INTRAMUSCULAR | Status: AC | PRN
  Administered 2017-08-04 (×2): 1 mg via INTRAVENOUS

## 2017-08-04 MED ORDER — SODIUM CHLORIDE 0.9 % IV SOLN
INTRAVENOUS | Status: DC
  Administered 2017-08-04: 08:00:00 via INTRAVENOUS

## 2017-08-04 MED ORDER — FENTANYL CITRATE (PF) 100 MCG/2ML IJ SOLN
INTRAMUSCULAR | Status: AC
  Filled 2017-08-04: qty 4

## 2017-08-04 NOTE — Discharge Instructions (Signed)
You may remove dressing and bathe in 24 hours.  ° ° °Bone Marrow Aspiration and Bone Marrow Biopsy, Adult, Care After °This sheet gives you information about how to care for yourself after your procedure. Your health care provider may also give you more specific instructions. If you have problems or questions, contact your health care provider. °What can I expect after the procedure? °After the procedure, it is common to have: °· Mild pain and tenderness. °· Swelling. °· Bruising. ° °Follow these instructions at home: °· Take over-the-counter or prescription medicines only as told by your health care provider. °· Do not take baths, swim, or use a hot tub until your health care provider approves. Ask if you can take a shower or have a sponge bath. °· Follow instructions from your health care provider about how to take care of the puncture site. Make sure you: °? Wash your hands with soap and water before you change your bandage (dressing). If soap and water are not available, use hand sanitizer. °? Change your dressing as told by your health care provider. °· Check your puncture site every day for signs of infection. Check for: °? More redness, swelling, or pain. °? More fluid or blood. °? Warmth. °? Pus or a bad smell. °· Return to your normal activities as told by your health care provider. Ask your health care provider what activities are safe for you. °· Do not drive for 24 hours if you were given a medicine to help you relax (sedative). °· Keep all follow-up visits as told by your health care provider. This is important. °Contact a health care provider if: °· You have more redness, swelling, or pain around the puncture site. °· You have more fluid or blood coming from the puncture site. °· Your puncture site feels warm to the touch. °· You have pus or a bad smell coming from the puncture site. °· You have a fever. °· Your pain is not controlled with medicine. °This information is not intended to replace advice  given to you by your health care provider. Make sure you discuss any questions you have with your health care provider. °Document Released: 10/17/2004 Document Revised: 10/18/2015 Document Reviewed: 09/11/2015 °Elsevier Interactive Patient Education © 2018 Elsevier Inc. ° ° ° ° °Moderate Conscious Sedation, Adult, Care After °These instructions provide you with information about caring for yourself after your procedure. Your health care provider may also give you more specific instructions. Your treatment has been planned according to current medical practices, but problems sometimes occur. Call your health care provider if you have any problems or questions after your procedure. °What can I expect after the procedure? °After your procedure, it is common: °· To feel sleepy for several hours. °· To feel clumsy and have poor balance for several hours. °· To have poor judgment for several hours. °· To vomit if you eat too soon. ° °Follow these instructions at home: °For at least 24 hours after the procedure: ° °· Do not: °? Participate in activities where you could fall or become injured. °? Drive. °? Use heavy machinery. °? Drink alcohol. °? Take sleeping pills or medicines that cause drowsiness. °? Make important decisions or sign legal documents. °? Take care of children on your own. °· Rest. °Eating and drinking °· Follow the diet recommended by your health care provider. °· If you vomit: °? Drink water, juice, or soup when you can drink without vomiting. °? Make sure you have little or no nausea before   eating solid foods. °General instructions °· Have a responsible adult stay with you until you are awake and alert. °· Take over-the-counter and prescription medicines only as told by your health care provider. °· If you smoke, do not smoke without supervision. °· Keep all follow-up visits as told by your health care provider. This is important. °Contact a health care provider if: °· You keep feeling nauseous or you  keep vomiting. °· You feel light-headed. °· You develop a rash. °· You have a fever. °Get help right away if: °· You have trouble breathing. °This information is not intended to replace advice given to you by your health care provider. Make sure you discuss any questions you have with your health care provider. °Document Released: 01/18/2013 Document Revised: 09/02/2015 Document Reviewed: 07/20/2015 °Elsevier Interactive Patient Education © 2018 Elsevier Inc. ° °

## 2017-08-04 NOTE — H&P (Signed)
Chief Complaint: Leukocytosis  Referring Physician(s): Higgs,Vetta  Supervising Physician: Arne Cleveland  Patient Status: [facility] - Out-pt  History of Present Illness: [name] is a 47 y.o. female with chronic leukocytosis.  She had a negative workup with normal flow cytometry, Jak 2 testing and BCR/ABL.    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    She was previously on prednisone but reports she is no longer taking prednisone.    She reportedly has stopped smoking.  We are asked to perform a bone marrow biopsy.  She is NPO. She takes Plavix stroke/carotid disease. She has not held this medication.  She feels well today. No fever/chills. No N/V. ROS negative.   Past Medical History:  Diagnosis Date  . Bleeding ulcer   . Bronchitis    hx of  . Carotid artery disease ([place])    bilateral internal carotid artery occlusion by duplex ultrasound  . Cervical cancer (H[facility])    cervical  . Chronic headaches   . Common migraine with intractable migraine 02/22/2017  . GERD (gastroesophageal reflux disease)   . Hyperlipidemia   . Hypothyroidism   . Pituitary tumor   . PUD (peptic ulcer disease)    NSAIDS   . Stenosis of right carotid artery   . Stroke ([place])    mini-stroke; memory deficits, slight  . TIA (transient ischemic attack)     Past Surgical History:  Procedure Laterality Date  . ABDOMINAL HYSTERECTOMY    . BIOPSY  02/23/2017   Procedure: BIOPSY;  Surgeon: Danie Binder, MD;  [facility];  Service: Endoscopy;;  duodenal bx's  . BLADDER SUSPENSION    . ESOPHAGOGASTRODUODENOSCOPY  02/2010   Dr. Barney Drain. Multiple duodenal ulcers, gastric erosions (bx negative for H.Pylori)  . ESOPHAGOGASTRODUODENOSCOPY N/A 06/19/2013   SLF: 1. Earlene Plater /dyspepsia due to uncontrolled GERD, & NSAID gastritis/duodenitis.  Marland Kitchen ESOPHAGOGASTRODUODENOSCOPY (EGD) WITH PROPOFOL N/A 02/23/2017   Procedure:  ESOPHAGOGASTRODUODENOSCOPY (EGD) WITH PROPOFOL;  Surgeon: Danie Binder, MD;  [facility];  Service: Endoscopy;  Laterality: N/A;  8:15am  . Multiple cyst removal     coccyx, left ring finger, pelvic area & throat/vocal cord and pilonidal cyst  . RADIOLOGY WITH ANESTHESIA  03/23/2012   Procedure: RADIOLOGY WITH ANESTHESIA;  Surgeon: Rob Hickman, MD;  Location: [facility];  Service: Radiology;  Laterality: N/A;  . TOTAL VAGINAL HYSTERECTOMY      Allergies: Pantoprazole sodium; Penicillins; Sulfa antibiotics; Codeine; and Demerol [meperidine]  Medications: Prior to Admission medications   Medication Sig Start Date End Date Taking? Authorizing Provider  aspirin EC 81 MG tablet Take 81 mg by mouth daily.   Yes [provider]  clopidogrel (PLAVIX) 75 MG tablet Take 1 tablet (75 mg total) by mouth daily. 06/19/13  Yes Fields, Marga Melnick, MD  estradiol (ESTRACE) 2 MG tablet TAKE 1 TABLET BY MOUTH ONCE DAILY. 11/25/16  Yes Florian Buff, MD  Icosapent Ethyl (VASCEPA) 1 G CAPS Take 2 g by mouth 2 (two) times daily.    Yes [provider]  lansoprazole (PREVACID) 30 MG capsule Take 1 capsule (30 mg total) by mouth daily before breakfast. 01/18/17  Yes Mahala Menghini, PA-C  levothyroxine (SYNTHROID, LEVOTHROID) 50 MCG tablet Take 1 tablet (50 mcg total) by mouth daily. 06/10/17  Yes Cassandria Anger, MD  Multiple Vitamin (MULTIVITAMIN) tablet Take 1 tablet by mouth daily.   Yes [provider]  naproxen  sodium (ALEVE) 220 MG tablet Take 220 mg by mouth daily as needed (pain).   Yes [provider]  ranitidine (ZANTAC) 300 MG tablet TAKE ONE TABLET BY MOUTH AT BEDTIME. 06/02/17  Yes Mahala Menghini, PA-C  rosuvastatin (CRESTOR) 20 MG tablet Take 20 mg by mouth daily.   Yes [provider]     Family History  Problem Relation Age of Onset  . Diabetes Mother   . COPD Mother   . Heart disease Father   . Cervical cancer Sister   . Colon  cancer Neg Hx   . Colon polyps Neg Hx     Social History   Socioeconomic History  . Marital status: Married    Spouse name: Sherren Mocha  . Number of children: 3  . Years of education: 45  . Highest education level: Not on file  Occupational History  . Occupation: unemployed    Fish farm manager: UNEMPLOYED  Social Needs  . Financial resource strain: Not on file  . Food insecurity:    Worry: Not on file    Inability: Not on file  . Transportation needs:    Medical: Not on file    Non-medical: Not on file  Tobacco Use  . Smoking status: Current Every Day Smoker    Packs/day: 1.00    Years: 25.00    Pack years: 25.00    Types: Cigarettes  . Smokeless tobacco: Never Used  Substance and Sexual Activity  . Alcohol use: Yes    Alcohol/week: 8.4 - 16.8 oz    Types: 14 - 28 Cans of beer per week    Comment: drinks 2-4 beers a day; occassional  . Drug use: No  . Sexual activity: Yes    Partners: Male    Birth control/protection: Surgical    Comment: spouse  Lifestyle  . Physical activity:    Days per week: Not on file    Minutes per session: Not on file  . Stress: Not on file  Relationships  . Social connections:    Talks on phone: Not on file    Gets together: Not on file    Attends religious service: Not on file    Active member of club or organization: Not on file    Attends meetings of clubs or organizations: Not on file    Relationship status: Not on file  Other Topics Concern  . Not on file  Social History Narrative   Lives with spouse and kids   Caffeine use:      Review of Systems: A 12 point ROS discussed and pertinent positives are indicated in the HPI above.  All other systems are negative. Review of Systems  Vital Signs: BP 130/79 (BP Location: Left Arm)   Pulse 69   Temp 98.5 F (36.9 C) (Oral)   Resp 20   SpO2 100%   Physical Exam  Constitutional: She is oriented to person, place, and time. She appears well-developed.  HENT:  Head: Normocephalic and  atraumatic.  Eyes: EOM are normal.  Neck: Normal range of motion.  Cardiovascular: Normal rate, regular rhythm and normal heart sounds.  Pulmonary/Chest: Effort normal and breath sounds normal. No respiratory distress.  Abdominal: Soft. She exhibits no distension.  Musculoskeletal: Normal range of motion.  Neurological: She is alert and oriented to person, place, and time.  Skin: Skin is warm and dry.  Psychiatric: She has a normal mood and affect. Her behavior is normal. Judgment and thought content normal.  Vitals reviewed.  Imaging: No results found.  Labs:  CBC: Recent Labs    02/18/17 1408 07/02/17 1407 08/04/17 0730  WBC 12.1* 12.8* 16.3*  HGB 11.9* 11.6* 12.2  HCT 35.4* 35.4* 36.6  PLT 401* 369 409*    COAGS: Recent Labs    08/04/17 0730  INR 0.91    BMP: Recent Labs    02/18/17 1408 05/18/17 07/02/17 1407  NA 136  --  140  K 3.7  --  3.5  CL 101  --  108  CO2 27  --  22  GLUCOSE 64*  --  96  BUN _0 CALCIUM 9.0  --  9.3  CREATININE 0.59 0.8 0.60  GFRNONAA >60  --  >60  GFRAA >60  --  >60    LIVER FUNCTION TESTS: Recent Labs    07/02/17 1407  BILITOT 0.5  AST 17  ALT 10*  ALKPHOS 72  PROT 7.5  ALBUMIN 4.0    TUMOR MARKERS: No results for input(s): AFPTM, CEA, CA199, CHROMGRNA in the last 8760 hours.  Assessment and Plan:  Leukocytosis  Will proceed with bone marrow biopsy today by Dr. Vernard Gambles.  Risks and benefits discussed with the patient including, but not limited to bleeding, infection, damage to adjacent structures or low yield requiring additional tests.  All of the patient's questions were answered, patient is agreeable to proceed. Consent signed and in chart.  Thank you for this interesting consult.  I greatly enjoyed meeting [name] and look forward to participating in their care.  A copy of this report was sent to the requesting provider on this date.  Electronically Signed: Murrell Redden, PA-C   08/04/2017,  8:31 AM      I spent a total of  30 Minutes in face to face in clinical consultation, greater than 50% of which was counseling/coordinating care for bone marrow biopsy.

## 2017-08-04 NOTE — Procedures (Signed)
  Procedure: CT bone marrow biopsy R iliac EBL:   minimal Complications:  none immediate  See full dictation in [facility].  Dillard Cannon MD Main # [phone] Pager  [phone]

## 2017-08-10 ENCOUNTER — Ambulatory Visit (HOSPITAL_COMMUNITY): Payer: BLUE CROSS/BLUE SHIELD | Admitting: Internal Medicine

## 2017-08-12 ENCOUNTER — Other Ambulatory Visit: Payer: Self-pay

## 2017-08-12 ENCOUNTER — Inpatient Hospital Stay (HOSPITAL_COMMUNITY): Payer: BLUE CROSS/BLUE SHIELD | Attending: Internal Medicine | Admitting: Internal Medicine

## 2017-08-12 ENCOUNTER — Encounter (HOSPITAL_COMMUNITY): Payer: Self-pay | Admitting: Internal Medicine

## 2017-08-12 VITALS — BP 107/69 | HR 75 | Temp 98.2°F | Resp 20 | Ht 62.0 in | Wt 107.1 lb

## 2017-08-12 DIAGNOSIS — Z809 Family history of malignant neoplasm, unspecified: Secondary | ICD-10-CM | POA: Diagnosis not present

## 2017-08-12 DIAGNOSIS — Z7289 Other problems related to lifestyle: Secondary | ICD-10-CM | POA: Diagnosis not present

## 2017-08-12 DIAGNOSIS — D72829 Elevated white blood cell count, unspecified: Secondary | ICD-10-CM | POA: Insufficient documentation

## 2017-08-12 DIAGNOSIS — D352 Benign neoplasm of pituitary gland: Secondary | ICD-10-CM | POA: Diagnosis not present

## 2017-08-12 DIAGNOSIS — E2740 Unspecified adrenocortical insufficiency: Secondary | ICD-10-CM | POA: Insufficient documentation

## 2017-08-12 DIAGNOSIS — F17210 Nicotine dependence, cigarettes, uncomplicated: Secondary | ICD-10-CM | POA: Insufficient documentation

## 2017-08-12 NOTE — Patient Instructions (Signed)
[facility] at [facility] Discharge Instructions  Today you saw Dr. Higgs.    Thank you for choosing [facility] at [facility] to provide your oncology and hematology care.  To afford each patient quality time with our provider, please arrive at least 15 minutes before your scheduled appointment time.   If you have a lab appointment with the Cancer Center please come in thru the  Main Entrance and check in at the main information desk  You need to re-schedule your appointment should you arrive 10 or more minutes late.  We strive to give you quality time with our providers, and arriving late affects you and other patients whose appointments are after yours.  Also, if you no show three or more times for appointments you may be dismissed from the clinic at the providers discretion.     Again, thank you for choosing [facility].  Our hope is that these requests will decrease the amount of time that you wait before being seen by our physicians.       _____________________________________________________________  Should you have questions after your visit to [facility], please contact our office at [phone] between the hours of 8:30 a.m. and 4:30 p.m.  Voicemails left after 4:30 p.m. will not be returned until the following business day.  For prescription refill requests, have your pharmacy contact our office.       Resources For Cancer Patients and their Caregivers ? American Cancer Society: Can assist with transportation, wigs, general needs, runs Look Good Feel Better.        1-888-227-6333 ? Cancer Care: Provides financial assistance, online support groups, medication/co-pay assistance.  1-800-813-HOPE (4673) ? [facility] Assists [place] cancer patients and their families through emotional , educational and financial support.  336-427-4357 ? [place] DSS Where to apply for  food stamps, Medicaid and utility assistance. 336-342-1394 ? [facility]: Transportation to medical appointments. 336-347-2287 ? Social Security Administration: May apply for disability if have a Stage IV cancer. 336-342-7796 1-800-772-1213 ? [place] Aging, Disability and Transit Services: Assists with nutrition, care and transit needs. 336-349-2343  Cancer Center Support Programs:   > Cancer Support Group  2nd Tuesday of the month 1pm-2pm, Journey Room   > Creative Journey  3rd Tuesday of the month 1130am-1pm, Journey Room    

## 2017-08-12 NOTE — Progress Notes (Signed)
Diagnosis Leukocytosis, unspecified type - Plan: CBC with Differential/Platelet, Comprehensive metabolic panel, Lactate dehydrogenase, BCR-ABL1, CML/ALL, PCR, QUANT, FISH Oncology  Staging Cancer Staging No matching staging information was found for the patient.  Assessment and Plan:  1.  Leukocytosis - chronic since 2012 .  The patient had not been seen at [facility] since 2017 until she RTC in 2019.  Previous workup included  flow cytometry which did not show any clonal lymphocyte population, jak 2 testing was negative and BCR ABL testing was negative in 2012.  She had labs done with Dr. Zhou on 05/18/2017 which showed a white count 14.8 hemoglobin 11.9 platelets were 447,000.  No significant abnormalities were noted on differential.    She has ongoing history of smoking and this is felt to be contributing to her ongoing mild leukocytosis.  Pt underwent repeat BCR/ABL testing that was positive on 07/02/2017.   Interpretation (BCRAL):  ABL1 e13a2 (b2a2, p210) fusion transcript.   Comment: Comment  POSITIVE for the BCR    Jak 2 testing was negative.   She has a normal sed rate.   C-reactive protein slightly elevated at 1.9.   LDH is normal at 146.  Pt previously had a negative BCR/ABL  in 2012.  Labs done 08/04/2017 show WBC 16,000.    She underwent bone marrow aspirate and biopsy for definitive diagnosis on 08/04/2017 with pathology returning with no evidence of CML.  .  Flow cytometry and FISH for CML were normal.    I have discussed with her that she will have repeat BCR/ABL in 4 months along with FISH for interval evaluation.  She will remain on close observation and will RTC in 4 months for repeat labs.  She should notify the office if she develop any new or changing symptoms prior to her next visit.   All questions answered and she expressed understanding of the information presented.    2.   Pituitary adenoma/prolactinoma with adrenal insufficiency.  Continue to follow-up with endocrinology as  recommended.  She reports she is no longer on prednisone.  3.  Smoking.  She reports she smokes.  She reports she participated in lung cancer screening program in 2012.  Follow-up as directed.    4.  Family history of various cancer.    She reports a strong family history of breast cancer.  The patient has undergone screening mammogram in December 2018 that was negative.  She should have repeat mammogram imaging in December 2019.  Interval History: 46-year-old female previously seen at [facility] in 2017.  She had a negative workup with normal flow cytometry, Jak 2 testing and BCR/ABL.  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  She was previously on prednisone but reports she is no longer taking prednisone.  She continues to smoke.  Current Status: Patient seen today for follow-up.  She is here to go bone marrow biopsy results.  She denies any fever, chills, night sweats, and has noted no adenopathy.  Problem List Patient Active Problem List   Diagnosis Date Noted  . Headache [R51] 07/15/2017  . TIA (transient ischemic attack) [G45.9] 07/15/2017  . Bloating [R14.0]   . Early satiety [R68.81]   . Common migraine with intractable migraine [G43.019] 02/22/2017  . GERD (gastroesophageal reflux disease) [K21.9] 01/18/2017  . Hyperthyroidism [E05.90] 06/15/2016  . Other specified hypothyroidism [E03.8] 06/15/2016  . Adrenal insufficiency (HCC) [E27.40] 06/15/2016  . Hyperprolactinemia (HCC) [E22.1] 06/15/2016  . Pituitary microadenoma (HCC) [D35.2] 06/15/2016  . Dyspepsia [  R10.13] 05/31/2013  . Hyperlipidemia [E78.5] 09/15/2012  . Carotid artery disease (HCC) [I77.9]   . PUD (peptic ulcer disease) [K27.9] 07/16/2010    Past Medical History Past Medical History:  Diagnosis Date  . Bleeding ulcer   . Bronchitis    hx of  . Carotid artery disease (HCC)    bilateral internal carotid artery occlusion by duplex ultrasound  . Cervical cancer (HCC)     cervical  . Chronic headaches   . Common migraine with intractable migraine 02/22/2017  . GERD (gastroesophageal reflux disease)   . Hyperlipidemia   . Hypothyroidism   . Pituitary tumor   . PUD (peptic ulcer disease)    NSAIDS   . Stenosis of right carotid artery   . Stroke ([facility]CC)    mini-stroke; memory deficits, slight  . TIA (transient ischemic attack)     Past Surgical History Past Surgical History:  Procedure Laterality Date  . ABDOMINAL HYSTERECTOMY    . BIOPSY  02/23/2017   Procedure: BIOPSY;  Surgeon: Fields, Sandi L, MD;  [facility];  Service: Endoscopy;;  duodenal bx's  . BLADDER SUSPENSION    . ESOPHAGOGASTRODUODENOSCOPY  02/2010   Dr. Sandi Fields. Multiple duodenal ulcers, gastric erosions (bx negative for H.Pylori)  . ESOPHAGOGASTRODUODENOSCOPY N/A 06/19/2013   SLF: 1. Nause /dyspepsia due to uncontrolled GERD, & NSAID gastritis/duodenitis.  . ESOPHAGOGASTRODUODENOSCOPY (EGD) WITH PROPOFOL N/A 02/23/2017   Procedure: ESOPHAGOGASTRODUODENOSCOPY (EGD) WITH PROPOFOL;  Surgeon: Fields, Sandi L, MD;  [facility];  Service: Endoscopy;  Laterality: N/A;  8:15am  . Multiple cyst removal     coccyx, left ring finger, pelvic area & throat/vocal cord and pilonidal cyst  . RADIOLOGY WITH ANESTHESIA  03/23/2012   Procedure: RADIOLOGY WITH ANESTHESIA;  Surgeon: Sanjeev K Deveshwar, MD;  Location[facility];  Service: Radiology;  Laterality: N/A;  . TOTAL VAGINAL HYSTERECTOMY      Family History Family History  Problem Relation Age of Onset  . Diabetes Mother   . COPD Mother   . Heart disease Father   . Cervical cancer Sister   . Colon cancer Neg Hx   . Colon polyps Neg Hx      Social History  reports that she has been smoking cigarettes.  She has a 25.00 pack-year smoking history. She has never used smokeless tobacco. She reports that she drinks about 8.4 - 16.8 oz of alcohol per week. She reports that she does not use drugs.  Medications  Current  Outpatient Medications:  .  aspirin EC 81 MG tablet, Take 81 mg by mouth daily., Disp: , Rfl:  .  clopidogrel (PLAVIX) 75 MG tablet, Take 1 tablet (75 mg total) by mouth daily., Disp: 30 tablet, Rfl: 0 .  estradiol (ESTRACE) 2 MG tablet, TAKE 1 TABLET BY MOUTH ONCE DAILY., Disp: 30 tablet, Rfl: 11 .  Icosapent Ethyl (VASCEPA) 1 G CAPS, Take 2 g by mouth 2 (two) times daily. , Disp: , Rfl:  .  lansoprazole (PREVACID) 30 MG capsule, Take 1 capsule (30 mg total) by mouth daily before breakfast., Disp: 30 capsule, Rfl: 11 .  levothyroxine (SYNTHROID, LEVOTHROID) 50 MCG tablet, Take 1 tablet (50 mcg total) by mouth daily., Disp: 90 tablet, Rfl: 2 .  Multiple Vitamin (MULTIVITAMIN) tablet, Take 1 tablet by mouth daily., Disp: , Rfl:  .  naproxen sodium (ALEVE) 220 MG tablet, Take 220 mg by mouth daily as needed (pain)., Disp: , Rfl:  .  ranitidine (ZANTAC) 300 MG tablet, TAKE   ONE TABLET BY MOUTH AT BEDTIME., Disp: 30 tablet, Rfl: 11 .  rosuvastatin (CRESTOR) 20 MG tablet, Take 20 mg by mouth daily., Disp: , Rfl:   Allergies Pantoprazole sodium; Penicillins; Sulfa antibiotics; Codeine; and Demerol [meperidine]  Review of Systems Review of Systems - Oncology ROS as per HPI otherwise 12 point ROS is negative.   Physical Exam  Vitals Wt Readings from Last 3 Encounters:  08/12/17 107 lb 1.6 oz (48.6 kg)  07/16/17 109 lb 12.8 oz (49.8 kg)  07/15/17 109 lb (49.4 kg)   Temp Readings from Last 3 Encounters:  08/12/17 98.2 F (36.8 C) (Oral)  08/04/17 97.7 F (36.5 C) (Oral)  07/16/17 98 F (36.7 C) (Oral)   BP Readings from Last 3 Encounters:  08/12/17 107/69  08/04/17 114/75  07/16/17 119/74   Pulse Readings from Last 3 Encounters:  08/12/17 75  08/04/17 81  07/16/17 76    Constitutional: Well-developed, well-nourished, and in no distress.   HENT: Head: Normocephalic and atraumatic.  Mouth/Throat: No oropharyngeal exudate. Mucosa moist. Eyes: Pupils are equal, round, and reactive  to light. Conjunctivae are normal. No scleral icterus.  Neck: Normal range of motion. Neck supple. No JVD present.  Cardiovascular: Normal rate, regular rhythm and normal heart sounds.  Exam reveals no gallop and no friction rub.   No murmur heard. Pulmonary/Chest: Effort normal and breath sounds normal. No respiratory distress. No wheezes.No rales.  Abdominal: Soft. Bowel sounds are normal. No distension. There is no tenderness. There is no guarding.  Musculoskeletal: No edema or tenderness.  Lymphadenopathy: No cervical, axillary  or supraclavicular adenopathy.  Neurological: Alert and oriented to person, place, and time. No cranial nerve deficit.  Skin: Skin is warm and dry. No rash noted. No erythema. No pallor.  Psychiatric: Affect and judgment normal.   Labs No visits with results within 3 Day(s) from this visit.  Latest known visit with results is:  Hospital Outpatient Visit on 08/04/2017  Component Date Value Ref Range Status  . WBC 08/04/2017 16.3* 4.0 - 10.5 K/uL Final  . RBC 08/04/2017 4.04  3.87 - 5.11 MIL/uL Final  . Hemoglobin 08/04/2017 12.2  12.0 - 15.0 g/dL Final  . HCT 08/04/2017 36.6  36.0 - 46.0 % Final  . MCV 08/04/2017 90.6  78.0 - 100.0 fL Final  . MCH 08/04/2017 30.2  26.0 - 34.0 pg Final  . MCHC 08/04/2017 33.3  30.0 - 36.0 g/dL Final  . RDW 08/04/2017 13.1  11.5 - 15.5 % Final  . Platelets 08/04/2017 409* 150 - 400 K/uL Final  . Neutrophils Relative % 08/04/2017 52  % Final  . Neutro Abs 08/04/2017 8.6* 1.7 - 7.7 K/uL Final  . Lymphocytes Relative 08/04/2017 36  % Final  . Lymphs Abs 08/04/2017 5.9* 0.7 - 4.0 K/uL Final  . Monocytes Relative 08/04/2017 7  % Final  . Monocytes Absolute 08/04/2017 1.1* 0.1 - 1.0 K/uL Final  . Eosinophils Relative 08/04/2017 4  % Final  . Eosinophils Absolute 08/04/2017 0.6  0.0 - 0.7 K/uL Final  . Basophils Relative 08/04/2017 1  % Final  . Basophils Absolute 08/04/2017 0.1  0.0 - 0.1 K/uL Final   Performed at [facility], [address] [place], [place] [address]  . Prothrombin Time 08/04/2017 12.2  11.4 - 15.2 seconds Final  . INR 08/04/2017 0.91   Final   Performed at  [facility], [address] [place], [place] [address]     Pathology Orders Placed   This Encounter  Procedures  . CBC with Differential/Platelet    Standing Status:   Future    Standing Expiration Date:   08/13/2018  . Comprehensive metabolic panel    Standing Status:   Future    Standing Expiration Date:   08/13/2018  . Lactate dehydrogenase    Standing Status:   Future    Standing Expiration Date:   08/13/2018  . BCR-ABL1, CML/ALL, PCR, QUANT    Standing Status:   Future    Standing Expiration Date:   08/13/2018  . FISH Oncology    Standing Status:   Future    Standing Expiration Date:   08/13/2018       Vetta Higgs MD 

## 2017-08-17 ENCOUNTER — Encounter (HOSPITAL_COMMUNITY): Payer: Self-pay | Admitting: Internal Medicine

## 2017-08-19 ENCOUNTER — Ambulatory Visit (HOSPITAL_COMMUNITY): Payer: BLUE CROSS/BLUE SHIELD | Admitting: Hematology

## 2017-08-19 LAB — TISSUE HYBRIDIZATION (BONE MARROW)-NCBH

## 2017-08-19 LAB — CHROMOSOME ANALYSIS, BONE MARROW

## 2017-08-30 LAB — TSH: TSH: 0.96 (ref 0.41–5.90)

## 2017-09-08 ENCOUNTER — Encounter: Payer: Self-pay | Admitting: "Endocrinology

## 2017-09-08 ENCOUNTER — Ambulatory Visit (INDEPENDENT_AMBULATORY_CARE_PROVIDER_SITE_OTHER): Payer: BLUE CROSS/BLUE SHIELD | Admitting: "Endocrinology

## 2017-09-08 VITALS — BP 117/78 | HR 76 | Ht 62.0 in | Wt 107.0 lb

## 2017-09-08 DIAGNOSIS — D352 Benign neoplasm of pituitary gland: Secondary | ICD-10-CM | POA: Diagnosis not present

## 2017-09-08 DIAGNOSIS — E038 Other specified hypothyroidism: Secondary | ICD-10-CM

## 2017-09-08 DIAGNOSIS — E221 Hyperprolactinemia: Secondary | ICD-10-CM

## 2017-09-08 MED ORDER — CABERGOLINE 0.5 MG PO TABS: 0.2500 mg | ORAL_TABLET | ORAL | 1 refills | Status: DC

## 2017-09-08 NOTE — Progress Notes (Signed)
Subjective:    Patient ID: [name], female    DOB: 11/24/1970, PCP Barry Dienes, NP   Past Medical History:  Diagnosis Date  . Bleeding ulcer   . Bronchitis    hx of  . Carotid artery disease ([place])    bilateral internal carotid artery occlusion by duplex ultrasound  . Cervical cancer (H[facility])    cervical  . Chronic headaches   . Common migraine with intractable migraine 02/22/2017  . GERD (gastroesophageal reflux disease)   . Hyperlipidemia   . Hypothyroidism   . Pituitary tumor   . PUD (peptic ulcer disease)    NSAIDS   . Stenosis of right carotid artery   . Stroke ([place])    mini-stroke; memory deficits, slight  . TIA (transient ischemic attack)    Past Surgical History:  Procedure Laterality Date  . ABDOMINAL HYSTERECTOMY    . BIOPSY  02/23/2017   Procedure: BIOPSY;  Surgeon: Danie Binder, MD;  [facility];  Service: Endoscopy;;  duodenal bx's  . BLADDER SUSPENSION    . ESOPHAGOGASTRODUODENOSCOPY  02/2010   Dr. Barney Drain. Multiple duodenal ulcers, gastric erosions (bx negative for H.Pylori)  . ESOPHAGOGASTRODUODENOSCOPY N/A 06/19/2013   SLF: 1. Earlene Plater /dyspepsia due to uncontrolled GERD, & NSAID gastritis/duodenitis.  Marland Kitchen ESOPHAGOGASTRODUODENOSCOPY (EGD) WITH PROPOFOL N/A 02/23/2017   Procedure: ESOPHAGOGASTRODUODENOSCOPY (EGD) WITH PROPOFOL;  Surgeon: Danie Binder, MD;  [facility];  Service: Endoscopy;  Laterality: N/A;  8:15am  . Multiple cyst removal     coccyx, left ring finger, pelvic area & throat/vocal cord and pilonidal cyst  . RADIOLOGY WITH ANESTHESIA  03/23/2012   Procedure: RADIOLOGY WITH ANESTHESIA;  Surgeon: Rob Hickman, MD;  Location: [place];  Service: Radiology;  Laterality: N/A;  . TOTAL VAGINAL HYSTERECTOMY     Social History   Socioeconomic History  . Marital status: Married    Spouse name: Sherren Mocha  . Number of children: 3  . Years of education: 56  . Highest education level: Not on file  Occupational  History  . Occupation: unemployed    Fish farm manager: UNEMPLOYED  Social Needs  . Financial resource strain: Not on file  . Food insecurity:    Worry: Not on file    Inability: Not on file  . Transportation needs:    Medical: Not on file    Non-medical: Not on file  Tobacco Use  . Smoking status: Current Every Day Smoker    Packs/day: 1.00    Years: 25.00    Pack years: 25.00    Types: Cigarettes  . Smokeless tobacco: Never Used  Substance and Sexual Activity  . Alcohol use: Yes    Alcohol/week: 8.4 - 16.8 oz    Types: 14 - 28 Cans of beer per week    Comment: drinks 2-4 beers a day; occassional  . Drug use: No  . Sexual activity: Yes    Partners: Male    Birth control/protection: Surgical    Comment: spouse  Lifestyle  . Physical activity:    Days per week: Not on file    Minutes per session: Not on file  . Stress: Not on file  Relationships  . Social connections:    Talks on phone: Not on file    Gets together: Not on file    Attends religious service: Not on file    Active member of club or organization: Not on file    Attends meetings of clubs or organizations: Not  on file    Relationship status: Not on file  Other Topics Concern  . Not on file  Social History Narrative   Lives with spouse and kids   Caffeine use:    Outpatient Encounter Medications as of 09/08/2017  Medication Sig  . aspirin EC 81 MG tablet Take 81 mg by mouth daily.  Derrill Memo ON 09/09/2017] cabergoline (DOSTINEX) 0.5 MG tablet Take 0.5 tablets (0.25 mg total) by mouth 2 (two) times a week.  . clopidogrel (PLAVIX) 75 MG tablet Take 1 tablet (75 mg total) by mouth daily.  Marland Kitchen estradiol (ESTRACE) 2 MG tablet TAKE 1 TABLET BY MOUTH ONCE DAILY.  Marland Kitchen Icosapent Ethyl (VASCEPA) 1 G CAPS Take 2 g by mouth 2 (two) times daily.   . lansoprazole (PREVACID) 30 MG capsule Take 1 capsule (30 mg total) by mouth daily before breakfast.  . levothyroxine (SYNTHROID, LEVOTHROID) 50 MCG tablet Take 1 tablet (50 mcg total)  by mouth daily.  . Multiple Vitamin (MULTIVITAMIN) tablet Take 1 tablet by mouth daily.  . naproxen sodium (ALEVE) 220 MG tablet Take 220 mg by mouth daily as needed (pain).  . ranitidine (ZANTAC) 300 MG tablet TAKE ONE TABLET BY MOUTH AT BEDTIME.  . rosuvastatin (CRESTOR) 20 MG tablet Take 20 mg by mouth daily.   No facility-administered encounter medications on file as of 09/08/2017.    ALLERGIES: Allergies  Allergen Reactions  . Pantoprazole Sodium Itching and Rash  . Penicillins Itching and Rash    Has patient had a PCN reaction causing immediate rash, facial/tongue/throat swelling, SOB or lightheadedness with hypotension: Yes Has patient had a PCN reaction causing severe rash involving mucus membranes or skin necrosis: No Has patient had a PCN reaction that required hospitalization: No Has patient had a PCN reaction occurring within the last 10 years: No If all of the above answers are "NO", then may proceed with Cephalosporin use.   . Sulfa Antibiotics Itching and Rash  . Codeine Nausea And Vomiting  . Demerol [Meperidine]     Red streaks on arm after coadministration of phenergan and demerol at time of EGD in 2012. Tolerated phenergan with subsequent EGD.    VACCINATION STATUS:  There is no immunization history on file for this patient.  HPI 47 year old female patient with medical history as above.   She is here today to follow-up for her hypothyroidism, history of adrenal insufficiency, hyperprolactinemia/pituitary microadenoma. - Patient continues to feel better.  She is currently on levothyroxine 50 mcg p.o. every morning.  She reports compliance.  She has no new complaints today.  She has a steady weight.  Denies palpitations, tremors, hot flashes.   -  she denies any recent hospitalization for adrenal crisis. - She has history of leukocytosis/ thrombocytosis, bilateral carotid artery stenosis complicated by cerebrovascular accident.   Review of  Systems  Constitutional: + Light build-progressively gaining weight, - fatigue, +subjective  hypothermia Eyes: no blurry vision, no xerophthalmia ENT: no sore throat, no nodules palpated in throat, no dysphagia/odynophagia, no hoarseness Musculoskeletal: no muscle/joint aches Skin:  - rashes Neurological: no tremors, no numbness, no tingling, no dizziness Psychiatric: no depression, no anxiety  Objective:    BP 117/78   Pulse 76   Ht 5\' 2"  (1.575 m)   Wt 107 lb (48.5 kg)   BMI 19.57 kg/m   Wt Readings from Last 3 Encounters:  09/08/17 107 lb (48.5 kg)  08/12/17 107 lb 1.6 oz (48.6 kg)  07/16/17 109 lb 12.8 oz (49.8 kg)  Physical Exam  Constitutional:  + light build, not in acute distress, normal state of mind Eyes: PERRLA, EOMI, no exophthalmos ENT: moist mucous membranes, no thyromegaly, no cervical lymphadenopathy Musculoskeletal: no gross deformities, strength intact in all four extremities Skin: moist,    normal skin color, +no active rashes Neurological: no tremor with outstretched hands CMP ( most recent) CMP     Component Value Date/Time   NA 140 07/02/2017 1407   K 3.5 07/02/2017 1407   CL 108 07/02/2017 1407   CO2 22 07/02/2017 1407   GLUCOSE 96 07/02/2017 1407   BUN 11 07/02/2017 1407   BUN 8 05/18/2017   CREATININE 0.60 07/02/2017 1407   CALCIUM 9.3 07/02/2017 1407   PROT 7.5 07/02/2017 1407   ALBUMIN 4.0 07/02/2017 1407   AST 17 07/02/2017 1407   ALT 10 (L) 07/02/2017 1407   ALKPHOS 72 07/02/2017 1407   BILITOT 0.5 07/02/2017 1407   GFRNONAA >60 07/02/2017 1407   GFRAA >60 07/02/2017 1407    Labs from Aug 27, 2017 show prolactin elevated at 75, TSH 0.958, free T4 1.03, a.m. cortisol 19 .2 Labs from May 19, 2017 showed TSH 0.682, free T4 1.14.  Labs from 02/05/2016 showed TSH 1.68, free T4 0.49   Labs from 12/21/2016: TSH 1.7, free T4 0.79 (normal 0.8-1 277), sodium 140, potassium 5.4, leukocytosis at 13, normal lipid panel.  Assessment &  Plan:   1. Hypothyroidism 2.  History of adrenal insufficiency ([place]) 3. Hyperprolactinemia ([place]) 4. Pituitary microadenoma ([place])   - She has long and complicated medical history including what appears to be steroid induced adrenal insufficiency which required up to 7.5 mg of prednisone daily for at least 6 years, until she decided to stop prednisone In February 2018. She denied any interval hospitalizations or adrenal crisis.    -Her most recent a.m. cortisol on Aug 27, 2017 was reassuring at 19.2.  She will not require adrenal intervention at this time.   - Regarding her history of hyperprolactinemia related to history microadenoma (7 mm pituitary adenoma based on MRI in 2012). - Her previsit prolactin level is high at 75 increasing from 11.9 on 06/16/2016. -He will benefit from reinitiation of cabergoline therapy.  I discussed and initiated cabergoline 0.25 mg p.o. twice weekly with plan to repeat prolactin in 3 months.   Her 2014 pituitary/sella MRI was significant for 7 mm pituitary microadenoma.  She will not require any surveillance MRI of sella/pituitary this time.    -Her thyroid function tests are appropriate for the current dose of levothyroxine at 50 mcg p.o. every morning.  She is advised to continue on same.    - We discussed about correct intake of levothyroxine, at fasting, with water, separated by at least 30 minutes from breakfast, and separated by more than 4 hours from calcium, iron, multivitamins, acid reflux medications (PPIs). -Patient is made aware of the fact that thyroid hormone replacement is needed for life, dose to be adjusted by periodic monitoring of thyroid function tests.    - Due to her body habitus being light build and propensity for multiple autoimmune dysfunction including premature surgical menopause, she will need a screening bone density test for osteoporosis at her PMD office.  - I advised patient to maintain close follow up with Barry Dienes, NP  for primary care needs. Follow up plan: Return in about 3 months (around 12/09/2017) for follow up with pre-visit labs.  Glade Lloyd, MD Phone: [phone]  Fax: [phone]  This note was  partially dictated with voice recognition software. Similar sounding words can be transcribed inadequately or may not  be corrected upon review.  09/08/2017, 6:13 PM

## 2017-10-05 ENCOUNTER — Other Ambulatory Visit: Payer: Self-pay | Admitting: "Endocrinology

## 2017-11-26 ENCOUNTER — Other Ambulatory Visit: Payer: Self-pay | Admitting: Obstetrics & Gynecology

## 2017-12-07 LAB — PROLACTIN: Prolactin: <1 ng/mL — ABNORMAL LOW

## 2017-12-07 LAB — TSH: TSH: 0.32 m[IU]/L — AB

## 2017-12-07 LAB — T4, FREE: Free T4: 1.1 ng/dL (ref 0.8–1.8)

## 2017-12-08 ENCOUNTER — Inpatient Hospital Stay (HOSPITAL_COMMUNITY): Payer: BLUE CROSS/BLUE SHIELD | Attending: Hematology

## 2017-12-08 DIAGNOSIS — F17210 Nicotine dependence, cigarettes, uncomplicated: Secondary | ICD-10-CM | POA: Insufficient documentation

## 2017-12-08 DIAGNOSIS — E2740 Unspecified adrenocortical insufficiency: Secondary | ICD-10-CM | POA: Diagnosis not present

## 2017-12-08 DIAGNOSIS — D352 Benign neoplasm of pituitary gland: Secondary | ICD-10-CM | POA: Insufficient documentation

## 2017-12-08 DIAGNOSIS — Z7289 Other problems related to lifestyle: Secondary | ICD-10-CM | POA: Diagnosis not present

## 2017-12-08 DIAGNOSIS — Z809 Family history of malignant neoplasm, unspecified: Secondary | ICD-10-CM | POA: Diagnosis not present

## 2017-12-08 DIAGNOSIS — D72829 Elevated white blood cell count, unspecified: Secondary | ICD-10-CM | POA: Insufficient documentation

## 2017-12-08 LAB — CBC WITH DIFFERENTIAL/PLATELET
BASOS ABS: 0.1 10*3/uL (ref 0.0–0.1)
Basophils Relative: 1 %
EOS PCT: 2 %
Eosinophils Absolute: 0.2 10*3/uL (ref 0.0–0.7)
HCT: 36.5 % (ref 36.0–46.0)
HEMOGLOBIN: 12.4 g/dL (ref 12.0–15.0)
LYMPHS PCT: 40 %
Lymphs Abs: 4.8 10*3/uL — ABNORMAL HIGH (ref 0.7–4.0)
MCH: 31.4 pg (ref 26.0–34.0)
MCHC: 34 g/dL (ref 30.0–36.0)
MCV: 92.4 fL (ref 78.0–100.0)
Monocytes Absolute: 0.7 10*3/uL (ref 0.1–1.0)
Monocytes Relative: 6 %
NEUTROS ABS: 6.4 10*3/uL (ref 1.7–7.7)
NEUTROS PCT: 53 %
PLATELETS: 358 10*3/uL (ref 150–400)
RBC: 3.95 MIL/uL (ref 3.87–5.11)
RDW: 12.8 % (ref 11.5–15.5)
WBC: 12.1 10*3/uL — AB (ref 4.0–10.5)

## 2017-12-08 LAB — COMPREHENSIVE METABOLIC PANEL
ALK PHOS: 72 U/L (ref 38–126)
ALT: 12 U/L (ref 0–44)
AST: 21 U/L (ref 15–41)
Albumin: 3.9 g/dL (ref 3.5–5.0)
Anion gap: 9 (ref 5–15)
BUN: 6 mg/dL (ref 6–20)
CHLORIDE: 106 mmol/L (ref 98–111)
CO2: 24 mmol/L (ref 22–32)
CREATININE: 0.61 mg/dL (ref 0.44–1.00)
Calcium: 8.9 mg/dL (ref 8.9–10.3)
GFR calc Af Amer: >60 mL/min (ref >60)
GFR calc non Af Amer: >60 mL/min (ref >60)
Glucose, Bld: 105 mg/dL — ABNORMAL HIGH (ref 70–99)
Potassium: 3.5 mmol/L (ref 3.5–5.1)
Sodium: 139 mmol/L (ref 135–145)
Total Bilirubin: 0.3 mg/dL (ref 0.3–1.2)
Total Protein: 7.6 g/dL (ref 6.5–8.1)

## 2017-12-08 LAB — LACTATE DEHYDROGENASE: LDH: 152 U/L (ref 98–192)

## 2017-12-09 ENCOUNTER — Ambulatory Visit (INDEPENDENT_AMBULATORY_CARE_PROVIDER_SITE_OTHER): Payer: BLUE CROSS/BLUE SHIELD | Admitting: "Endocrinology

## 2017-12-09 ENCOUNTER — Encounter: Payer: Self-pay | Admitting: "Endocrinology

## 2017-12-09 VITALS — BP 123/85 | HR 90 | Ht 62.0 in | Wt 103.0 lb

## 2017-12-09 DIAGNOSIS — E038 Other specified hypothyroidism: Secondary | ICD-10-CM | POA: Diagnosis not present

## 2017-12-09 DIAGNOSIS — E221 Hyperprolactinemia: Secondary | ICD-10-CM

## 2017-12-09 MED ORDER — CABERGOLINE 0.5 MG PO TABS: ORAL_TABLET | ORAL | 2 refills | Status: DC

## 2017-12-10 NOTE — Progress Notes (Signed)
Endocrinology follow-up note   Subjective:    Patient ID: [name], female    DOB: 1971-03-27, PCP Barry Dienes, NP   Past Medical History:  Diagnosis Date  . Bleeding ulcer   . Bronchitis    hx of  . Carotid artery disease ([place])    bilateral internal carotid artery occlusion by duplex ultrasound  . Cervical cancer ([facility])    cervical  . Chronic headaches   . Common migraine with intractable migraine 02/22/2017  . GERD (gastroesophageal reflux disease)   . Hyperlipidemia   . Hypothyroidism   . Pituitary tumor   . PUD (peptic ulcer disease)    NSAIDS   . Stenosis of right carotid artery   . Stroke ([place])    mini-stroke; memory deficits, slight  . TIA (transient ischemic attack)    Past Surgical History:  Procedure Laterality Date  . ABDOMINAL HYSTERECTOMY    . BIOPSY  02/23/2017   Procedure: BIOPSY;  Surgeon: Danie Binder, MD;  [facility];  Service: Endoscopy;;  duodenal bx's  . BLADDER SUSPENSION    . ESOPHAGOGASTRODUODENOSCOPY  02/2010   Dr. Barney Drain. Multiple duodenal ulcers, gastric erosions (bx negative for H.Pylori)  . ESOPHAGOGASTRODUODENOSCOPY N/A 06/19/2013   SLF: 1. Earlene Plater /dyspepsia due to uncontrolled GERD, & NSAID gastritis/duodenitis.  Marland Kitchen ESOPHAGOGASTRODUODENOSCOPY (EGD) WITH PROPOFOL N/A 02/23/2017   Procedure: ESOPHAGOGASTRODUODENOSCOPY (EGD) WITH PROPOFOL;  Surgeon: Danie Binder, MD;  [facility];  Service: Endoscopy;  Laterality: N/A;  8:15am  . Multiple cyst removal     coccyx, left ring finger, pelvic area & throat/vocal cord and pilonidal cyst  . RADIOLOGY WITH ANESTHESIA  03/23/2012   Procedure: RADIOLOGY WITH ANESTHESIA;  Surgeon: Rob Hickman, MD;  Location: [place];  Service: Radiology;  Laterality: N/A;  . TOTAL VAGINAL HYSTERECTOMY     Social History   Socioeconomic History  . Marital status: Married    Spouse name: Sherren Mocha  . Number of children: 3  . Years of education: 69  . Highest education level:  Not on file  Occupational History  . Occupation: unemployed    Fish farm manager: UNEMPLOYED  Social Needs  . Financial resource strain: Not on file  . Food insecurity:    Worry: Not on file    Inability: Not on file  . Transportation needs:    Medical: Not on file    Non-medical: Not on file  Tobacco Use  . Smoking status: Current Every Day Smoker    Packs/day: 1.00    Years: 25.00    Pack years: 25.00    Types: Cigarettes  . Smokeless tobacco: Never Used  Substance and Sexual Activity  . Alcohol use: Yes    Alcohol/week: 14.0 - 28.0 standard drinks    Types: 14 - 28 Cans of beer per week    Comment: drinks 2-4 beers a day; occassional  . Drug use: No  . Sexual activity: Yes    Partners: Male    Birth control/protection: Surgical    Comment: spouse  Lifestyle  . Physical activity:    Days per week: Not on file    Minutes per session: Not on file  . Stress: Not on file  Relationships  . Social connections:    Talks on phone: Not on file    Gets together: Not on file    Attends religious service: Not on file    Active member of club or organization: Not on file    Attends meetings  of clubs or organizations: Not on file    Relationship status: Not on file  Other Topics Concern  . Not on file  Social History Narrative   Lives with spouse and kids   Caffeine use:    Outpatient Encounter Medications as of 12/09/2017  Medication Sig  . aspirin EC 81 MG tablet Take 81 mg by mouth daily.  . cabergoline (DOSTINEX) 0.5 MG tablet TAKE 1/2 TABLET BY MOUTH WEEKLY  . clopidogrel (PLAVIX) 75 MG tablet Take 1 tablet (75 mg total) by mouth daily.  Marland Kitchen estradiol (ESTRACE) 2 MG tablet TAKE 1 TABLET BY MOUTH ONCE DAILY.  Marland Kitchen estradiol (ESTRACE) 2 MG tablet TAKE 1 TABLET BY MOUTH ONCE DAILY.  Marland Kitchen Icosapent Ethyl (VASCEPA) 1 G CAPS Take 2 g by mouth 2 (two) times daily.   . lansoprazole (PREVACID) 30 MG capsule Take 1 capsule (30 mg total) by mouth daily before breakfast.  . levothyroxine  (SYNTHROID, LEVOTHROID) 50 MCG tablet Take 1 tablet (50 mcg total) by mouth daily.  . Multiple Vitamin (MULTIVITAMIN) tablet Take 1 tablet by mouth daily.  . naproxen sodium (ALEVE) 220 MG tablet Take 220 mg by mouth daily as needed (pain).  . ranitidine (ZANTAC) 300 MG tablet TAKE ONE TABLET BY MOUTH AT BEDTIME.  . rosuvastatin (CRESTOR) 20 MG tablet Take 20 mg by mouth daily.  . [DISCONTINUED] cabergoline (DOSTINEX) 0.5 MG tablet TAKE 1/2 TABLET BY MOUTH TWICE A WEEK.   No facility-administered encounter medications on file as of 12/09/2017.    ALLERGIES: Allergies  Allergen Reactions  . Pantoprazole Sodium Itching and Rash  . Penicillins Itching and Rash    Has patient had a PCN reaction causing immediate rash, facial/tongue/throat swelling, SOB or lightheadedness with hypotension: Yes Has patient had a PCN reaction causing severe rash involving mucus membranes or skin necrosis: No Has patient had a PCN reaction that required hospitalization: No Has patient had a PCN reaction occurring within the last 10 years: No If all of the above answers are "NO", then may proceed with Cephalosporin use.   . Sulfa Antibiotics Itching and Rash  . Codeine Nausea And Vomiting  . Demerol [Meperidine]     Red streaks on arm after coadministration of phenergan and demerol at time of EGD in 2012. Tolerated phenergan with subsequent EGD.    VACCINATION STATUS:  There is no immunization history on file for this patient.  HPI 47 year old female with medical history as above.   She is here today to follow-up for her hypothyroidism, history of adrenal insufficiency, hyperprolactinemia/pituitary microadenoma. -She continues to feel better.  She was initiated on cabergoline 0.25 mg p.o. twice a week during her last visit.  -   She is also currently on levothyroxine 50 mcg p.o. nightly for hypothyroidism.  She reports compliance to this medications.  She has no new complaints today.    -She continues to  have poor appetite, lost 4 pounds since last visit.  She denies nausea, vomiting.  Denies palpitations, tremors, hot flashes.   -  she denies any recent hospitalization for adrenal crisis. - She has history of leukocytosis/ thrombocytosis, bilateral carotid artery stenosis complicated by cerebrovascular accident.   Review of Systems  Constitutional: + Light build-progressively losing  weight, - fatigue, +subjective  hypothermia Eyes: no blurry vision, no xerophthalmia ENT: no sore throat, no nodules palpated in throat, no dysphagia/odynophagia, no hoarseness Musculoskeletal: no muscle/joint aches Skin:  - rashes Neurological: no tremors, no numbness, no tingling, no dizziness Psychiatric: no depression, no  anxiety  Objective:    BP 123/85   Pulse 90   Ht _0  (1.575 m)   Wt 103 lb (46.7 kg)   BMI 18.84 kg/m   Wt Readings from Last 3 Encounters:  12/09/17 103 lb (46.7 kg)  09/08/17 107 lb (48.5 kg)  08/12/17 107 lb 1.6 oz (48.6 kg)    Physical Exam  Constitutional:  + light build, not in acute distress, normal state of mind Eyes: PERRLA, EOMI, no exophthalmos ENT:   Moist mucous membranes,  no thyromegaly, no cervical lymphadenopathy Musculoskeletal: no gross deformities, strength intact in all four extremities Skin: moist,    normal skin color, +no active rashes Neurological: no tremor with outstretched hands   CMP     Component Value Date/Time   NA 139 12/08/2017 1052   K 3.5 12/08/2017 1052   CL 106 12/08/2017 1052   CO2 24 12/08/2017 1052   GLUCOSE 105 (H) 12/08/2017 1052   BUN 6 12/08/2017 1052   BUN 8 05/18/2017   CREATININE 0.61 12/08/2017 1052   CALCIUM 8.9 12/08/2017 1052   PROT 7.6 12/08/2017 1052   ALBUMIN 3.9 12/08/2017 1052   AST 21 12/08/2017 1052   ALT 12 12/08/2017 1052   ALKPHOS 72 12/08/2017 1052   BILITOT 0.3 12/08/2017 1052   GFRNONAA >60 12/08/2017 1052   GFRAA >60 12/08/2017 1052    Recent Results (from the past 2160 hour(s))  TSH      Status: Abnormal   Collection Time: 12/06/17 10:08 AM  Result Value Ref Range   TSH 0.32 (L) mIU/L    Comment:           Reference Range .           > or = 20 Years  0.40-4.50 .                Pregnancy Ranges           First trimester    0.26-2.66           Second trimester   0.55-2.73           Third trimester    0.43-2.91   T4, free     Status: None   Collection Time: 12/06/17 10:08 AM  Result Value Ref Range   Free T4 1.1 0.8 - 1.8 ng/dL  Prolactin     Status: Abnormal   Collection Time: 12/06/17 10:08 AM  Result Value Ref Range   Prolactin <1.0 (L) ng/mL    Comment:             Reference Range  Females         Non-pregnant        3.0-30.0         Pregnant           10.0-209.0         Postmenopausal      2.0-20.0 . . .   CBC with Differential/Platelet     Status: Abnormal   Collection Time: 12/08/17 10:52 AM  Result Value Ref Range   WBC 12.1 (H) 4.0 - 10.5 K/uL   RBC 3.95 3.87 - 5.11 MIL/uL   Hemoglobin 12.4 12.0 - 15.0 g/dL   HCT 36.5 36.0 - 46.0 %   MCV 92.4 78.0 - 100.0 fL   MCH 31.4 26.0 - 34.0 pg   MCHC 34.0 30.0 - 36.0 g/dL   RDW 12.8 11.5 - 15.5 %   Platelets 358 150 - 400 K/uL  Neutrophils Relative % 53 %   Neutro Abs 6.4 1.7 - 7.7 K/uL   Lymphocytes Relative 40 %   Lymphs Abs 4.8 (H) 0.7 - 4.0 K/uL   Monocytes Relative 6 %   Monocytes Absolute 0.7 0.1 - 1.0 K/uL   Eosinophils Relative 2 %   Eosinophils Absolute 0.2 0.0 - 0.7 K/uL   Basophils Relative 1 %   Basophils Absolute 0.1 0.0 - 0.1 K/uL    Comment: Performed at [facility], [address], [place], [place] [address]  Comprehensive metabolic panel     Status: Abnormal   Collection Time: 12/08/17 10:52 AM  Result Value Ref Range   Sodium 139 135 - 145 mmol/L   Potassium 3.5 3.5 - 5.1 mmol/L   Chloride 106 98 - 111 mmol/L   CO2 24 22 - 32 mmol/L   Glucose, Bld 105 (H) 70 - 99 mg/dL   BUN 6 6 - 20 mg/dL   Creatinine, Ser 0.61 0.44 - 1.00 mg/dL   Calcium 8.9 8.9 - 10.3 mg/dL   Total  Protein 7.6 6.5 - 8.1 g/dL   Albumin 3.9 3.5 - 5.0 g/dL   AST 21 15 - 41 U/L   ALT 12 0 - 44 U/L   Alkaline Phosphatase 72 38 - 126 U/L   Total Bilirubin 0.3 0.3 - 1.2 mg/dL   GFR calc non Af Amer >60 >60 mL/min   GFR calc Af Amer >60 >60 mL/min    Comment: (NOTE) The eGFR has been calculated using the CKD EPI equation. This calculation has not been validated in all clinical situations. eGFR's persistently <60 mL/min signify possible Chronic Kidney Disease.    Anion gap 9 5 - 15    Comment: Performed at [facility], [address], [place], [place] [address]  Lactate dehydrogenase     Status: None   Collection Time: 12/08/17 10:52 AM  Result Value Ref Range   LDH 152 98 - 192 U/L    Comment: Performed at [facility], [address], [place], [place] [address]     Labs from Aug 27, 2017 show prolactin elevated at 75, TSH 0.958, free T4 1.03, a.m. cortisol 19 .2 Labs from May 19, 2017 showed TSH 0.682, free T4 1.14.  Labs from 02/05/2016 showed TSH 1.68, free T4 0.49   Labs from 12/21/2016: TSH 1.7, free T4 0.79 (normal 0.8-1 277), sodium 140, potassium 5.4, leukocytosis at 13, normal lipid panel.  Assessment & Plan:   1. Hypothyroidism 2.  History of adrenal insufficiency ([place]) 3. Hyperprolactinemia ([place]) 4. Pituitary microadenoma ([place])   - She has long and complicated medical history including what appears to be steroid induced adrenal insufficiency which required up to 7.5 mg of prednisone daily for at least 6 years, until she decided to stop prednisone in February 2018. She denied any interval hospitalizations or adrenal crisis.    -Her most recent a.m. cortisol on Aug 27, 2017 was reassuring at 19.2.  She will not require adrenal intervention at this time.  She will have a.m. cortisol during her next blood work.   - Regarding her history of hyperprolactinemia related to history microadenoma (7 mm pituitary adenoma based on MRI in 2012). - Her previsit prolactin  level is <1.0 decreasing from a  high of 75. -I will proceed to decrease her cabergoline therapy to 0.25 mg weekly with plan to repeat prolactin in 6 months.   Her 2014 pituitary/sella MRI was significant for 7 mm pituitary microadenoma.  She will  not require any surveillance MRI of sella/pituitary this time.    -Her previsit thyroid function tests are consistent with appropriate replacement with levothyroxine 50 mcg p.o. every morning.    - We discussed about correct intake of levothyroxine, at fasting, with water, separated by at least 30 minutes from breakfast, and separated by more than 4 hours from calcium, iron, multivitamins, acid reflux medications (PPIs). -Patient is made aware of the fact that thyroid hormone replacement is needed for life, dose to be adjusted by periodic monitoring of thyroid function tests.    - Due to her body habitus being light build and propensity for multiple autoimmune dysfunction including premature surgical menopause, she will need a screening bone density test for osteoporosis .  This test would be scheduled to be ordered in [facility] as soon as possible.  - I advised patient to maintain close follow up with Barry Dienes, NP for primary care needs.  Follow up plan: Return in about 6 months (around 06/11/2018) for Follow up with Pre-visit Labs, Follow up with Bone Density.  Glade Lloyd, MD Phone: [phone]  Fax: [phone]  This note was partially dictated with voice recognition software. Similar sounding words can be transcribed inadequately or may not  be corrected upon review.  12/10/2017, 9:29 AM

## 2017-12-14 LAB — BCR-ABL1, CML/ALL, PCR, QUANT

## 2017-12-15 ENCOUNTER — Inpatient Hospital Stay (HOSPITAL_COMMUNITY): Payer: BLUE CROSS/BLUE SHIELD | Attending: Internal Medicine | Admitting: Internal Medicine

## 2017-12-15 ENCOUNTER — Other Ambulatory Visit (HOSPITAL_COMMUNITY): Payer: Self-pay | Admitting: Internal Medicine

## 2017-12-15 ENCOUNTER — Encounter (HOSPITAL_COMMUNITY): Payer: Self-pay | Admitting: Internal Medicine

## 2017-12-15 VITALS — BP 117/65 | HR 85 | Temp 98.1°F | Resp 14 | Wt 103.7 lb

## 2017-12-15 DIAGNOSIS — D72829 Elevated white blood cell count, unspecified: Secondary | ICD-10-CM

## 2017-12-15 DIAGNOSIS — D352 Benign neoplasm of pituitary gland: Secondary | ICD-10-CM

## 2017-12-15 DIAGNOSIS — E038 Other specified hypothyroidism: Secondary | ICD-10-CM

## 2017-12-15 DIAGNOSIS — F17210 Nicotine dependence, cigarettes, uncomplicated: Secondary | ICD-10-CM

## 2017-12-15 DIAGNOSIS — Z720 Tobacco use: Secondary | ICD-10-CM

## 2017-12-15 DIAGNOSIS — D72828 Other elevated white blood cell count: Secondary | ICD-10-CM

## 2017-12-15 DIAGNOSIS — R918 Other nonspecific abnormal finding of lung field: Secondary | ICD-10-CM

## 2017-12-15 DIAGNOSIS — Z803 Family history of malignant neoplasm of breast: Secondary | ICD-10-CM

## 2017-12-15 NOTE — Progress Notes (Signed)
Diagnosis Other elevated white blood cell (WBC) count - Plan: CBC with Differential/Platelet, Comprehensive metabolic panel, Lactate dehydrogenase, BCR-ABL1, CML/ALL, PCR, QUANT  Staging Cancer Staging No matching staging information was found for the patient.  Assessment and Plan:  1.  Leukocytosis - chronic since 2012 .  The patient had not been seen at [facility] since 2017 until she RTC in 2019.  Previous workup included  flow cytometry which did not show any clonal lymphocyte population, jak 2 testing was negative and BCR ABL testing was negative in 2012.  She had labs done with Dr. Talbert Cage on 05/18/2017 which showed a white count 14.8 hemoglobin 11.9 platelets were 447,000.  No significant abnormalities were noted on differential.    She has ongoing history of smoking and this is felt to be contributing to her ongoing mild leukocytosis.  Pt underwent repeat BCR/ABL testing that was positive on 07/02/2017.   Interpretation [facility]):  ABL1 e13a2 (b2a2, p210) fusion transcript.   Comment: Comment  POSITIVE for the BCR    Jak 2 testing was negative.   She has a normal sed rate.   C-reactive protein slightly elevated at 1.9.   LDH is normal at 146.  Pt previously had a negative BCR/ABL  in 2012.  Labs done 08/04/2017 show WBC 16,000.    She underwent bone marrow aspirate and biopsy for definitive diagnosis on 08/04/2017 with pathology returning with no evidence of CML.  .  Flow cytometry and FISH for CML were normal.    Labs done 12/08/2017 reviewed and showed WBC 12 HB 12.4 plts 358,000.  Chemistries WNL with K+ 3.5 Cr 0.61 and normal LFTs.  BCR/ABL negative.    She will remain on observation and will RTC in 06/2018 for follow-up and labs with BCR/ABL testing.  She should notify the office if any problems prior to her next visit.    2.   Pituitary adenoma/prolactinoma with adrenal insufficiency.  Follow-up with endocrinology as recommended.  She reports she is no longer on prednisone.  3.  Pulmonary  nodule/Smoking.  She reports 30 pack yr history of smoking.  She had CT chest done 07/29/2010 that was reviewed and showed IMPRESSION: Scattered tiny pulmonary nodules are nonspecific. If the patient is at high risk for bronchogenic carcinoma, follow-up chest CT at 1 year is recommended.  If the patient is at low risk, no follow-up is needed.    Due to ongoing smoking history and findings of pulmonary nodules on prior imaging, she will be set up for CT chest without contrast for follow-up.  She will be notified of results.    4.  Family history of various cancer.    She reports a strong family history of breast cancer.  The patient has undergone screening mammogram in December 2018 that was negative.  She should have repeat mammogram imaging in December 2019.  5.  Hypothyroidism.  Follow-up with PCP for monitoring.    Greater than 30 minutes with more than 50% spent in counseling and coordination of care.    Interval History: 47 year old female previously seen at [facility] in 2017.  She had a negative workup with normal flow cytometry, Jak 2 testing and BCR/ABL.  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  She was previously on prednisone but reports she is no longer taking prednisone.  She continues to smoke.  Current Status: Patient seen today for follow-up.  She is here to go over labs.  She continues to smoke.  Problem List Patient Active Problem List   Diagnosis Date Noted  . Headache [R51] 07/15/2017  . TIA (transient ischemic attack) [G45.9] 07/15/2017  . Bloating [R14.0]   . Early satiety [R68.81]   . Common migraine with intractable migraine [G43.019] 02/22/2017  . GERD (gastroesophageal reflux disease) [K21.9] 01/18/2017  . Hyperthyroidism [E05.90] 06/15/2016  . Other specified hypothyroidism [E03.8] 06/15/2016  . Adrenal insufficiency ([place]) [E27.40] 06/15/2016  . Hyperprolactinemia ([place]) [E22.1] 06/15/2016  . Pituitary microadenoma  ([place]) [D35.2] 06/15/2016  . Dyspepsia [R10.13] 05/31/2013  . Hyperlipidemia [E78.5] 09/15/2012  . Carotid artery disease ([place]) [I77.9]   . PUD (peptic ulcer disease) [K27.9] 07/16/2010    Past Medical History Past Medical History:  Diagnosis Date  . Bleeding ulcer   . Bronchitis    hx of  . 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 Cervical cancer (H[facility])    cervical  . Chronic headaches   . Common migraine with intractable migraine 02/22/2017  . GERD (gastroesophageal reflux disease)   . Hyperlipidemia   . Hypothyroidism   . Pituitary tumor   . PUD (peptic ulcer disease)    NSAIDS   . Stenosis of right carotid artery   . Stroke ([place])    mini-stroke; memory deficits, slight  . TIA (transient ischemic attack)     Past Surgical History Past Surgical History:  Procedure Laterality Date  . ABDOMINAL HYSTERECTOMY    . BIOPSY  02/23/2017   Procedure: BIOPSY;  Surgeon: Danie Binder, MD;  [facility];  Service: Endoscopy;;  duodenal bx's  . BLADDER SUSPENSION    . ESOPHAGOGASTRODUODENOSCOPY  02/2010   Dr. Barney Drain. Multiple duodenal ulcers, gastric erosions (bx negative for H.Pylori)  . ESOPHAGOGASTRODUODENOSCOPY N/A 06/19/2013   SLF: 1. Earlene Plater /dyspepsia due to uncontrolled GERD, & NSAID gastritis/duodenitis.  Marland Kitchen ESOPHAGOGASTRODUODENOSCOPY (EGD) WITH PROPOFOL N/A 02/23/2017   Procedure: ESOPHAGOGASTRODUODENOSCOPY (EGD) WITH PROPOFOL;  Surgeon: Danie Binder, MD;  Location: [facility];  Service: Endoscopy;  Laterality: N/A;  8:15am  . Multiple cyst removal     coccyx, left ring finger, pelvic area & throat/vocal cord and pilonidal cyst  . RADIOLOGY WITH ANESTHESIA  03/23/2012   Procedure: RADIOLOGY WITH ANESTHESIA;  Surgeon: Rob Hickman, MD;  Location: [place];  Service: Radiology;  Laterality: N/A;  . TOTAL VAGINAL HYSTERECTOMY      Family History Family History  Problem Relation Age of  Onset  . Diabetes Mother   . COPD Mother   . Heart disease Father   . Cervical cancer Sister   . Colon cancer Neg Hx   . Colon polyps Neg Hx      Social History  reports that she has been smoking cigarettes. She has a 25.00 pack-year smoking history. She has never used smokeless tobacco. She reports that she drinks about 14.0 - 28.0 standard drinks of alcohol per week. She reports that she does not use drugs.  Medications  Current Outpatient Medications:  .  aspirin EC 81 MG tablet, Take 81 mg by mouth daily., Disp: , Rfl:  .  cabergoline (DOSTINEX) 0.5 MG tablet, TAKE 1/2 TABLET BY MOUTH WEEKLY, Disp: 4 tablet, Rfl: 2 .  clopidogrel (PLAVIX) 75 MG tablet, Take 1 tablet (75 mg total) by mouth daily., Disp: 30 tablet, Rfl: 0 .  estradiol (ESTRACE) 2 MG tablet, TAKE 1 TABLET BY MOUTH ONCE DAILY., Disp: 30 tablet, Rfl: 11 .  estradiol (ESTRACE) 2 MG tablet,  TAKE 1 TABLET BY MOUTH ONCE DAILY., Disp: 30 tablet, Rfl: 11 .  Icosapent Ethyl (VASCEPA) 1 G CAPS, Take 2 g by mouth 2 (two) times daily. , Disp: , Rfl:  .  lansoprazole (PREVACID) 30 MG capsule, Take 1 capsule (30 mg total) by mouth daily before breakfast., Disp: 30 capsule, Rfl: 11 .  levothyroxine (SYNTHROID, LEVOTHROID) 50 MCG tablet, Take 1 tablet (50 mcg total) by mouth daily., Disp: 90 tablet, Rfl: 2 .  Multiple Vitamin (MULTIVITAMIN) tablet, Take 1 tablet by mouth daily., Disp: , Rfl:  .  naproxen sodium (ALEVE) 220 MG tablet, Take 220 mg by mouth daily as needed (pain)., Disp: , Rfl:  .  ranitidine (ZANTAC) 300 MG tablet, TAKE ONE TABLET BY MOUTH AT BEDTIME., Disp: 30 tablet, Rfl: 11 .  rosuvastatin (CRESTOR) 20 MG tablet, Take 20 mg by mouth daily., Disp: , Rfl:   Allergies Pantoprazole sodium; Penicillins; Sulfa antibiotics; Codeine; and Demerol [meperidine]  Review of Systems Review of Systems - Oncology ROS negative   Physical Exam  Vitals Wt Readings from Last 3 Encounters:  12/15/17 103 lb 11.2 oz (47 kg)   12/09/17 103 lb (46.7 kg)  09/08/17 107 lb (48.5 kg)   Temp Readings from Last 3 Encounters:  12/15/17 98.1 F (36.7 C) (Oral)  08/12/17 98.2 F (36.8 C) (Oral)  08/04/17 97.7 F (36.5 C) (Oral)   BP Readings from Last 3 Encounters:  12/15/17 117/65  12/09/17 123/85  09/08/17 117/78   Pulse Readings from Last 3 Encounters:  12/15/17 85  12/09/17 90  09/08/17 76   Constitutional: Well-developed, well-nourished, and in no distress.   HENT: Head: Normocephalic and atraumatic.  Mouth/Throat: No oropharyngeal exudate. Mucosa moist. Eyes: Pupils are equal, round, and reactive to light. Conjunctivae are normal. No scleral icterus.  Neck: Normal range of motion. Neck supple. No JVD present.  Cardiovascular: Normal rate, regular rhythm and normal heart sounds.  Exam reveals no gallop and no friction rub.   No murmur heard. Pulmonary/Chest: Effort normal and breath sounds normal. No respiratory distress. No wheezes.No rales.  Abdominal: Soft. Bowel sounds are normal. No distension. There is no tenderness. There is no guarding.  Musculoskeletal: No edema or tenderness.  Lymphadenopathy: No cervical, axillary or supraclavicular adenopathy.  Neurological: Alert and oriented to person, place, and time. No cranial nerve deficit.  Skin: Skin is warm and dry. No rash noted. No erythema. No pallor.  Psychiatric: Affect and judgment normal.   Labs No visits with results within 3 Day(s) from this visit.  Latest known visit with results is:  Appointment on 12/08/2017  Component Date Value Ref Range Status  . WBC 12/08/2017 12.1* 4.0 - 10.5 K/uL Final  . RBC 12/08/2017 3.95  3.87 - 5.11 MIL/uL Final  . Hemoglobin 12/08/2017 12.4  12.0 - 15.0 g/dL Final  . HCT 12/08/2017 36.5  36.0 - 46.0 % Final  . MCV 12/08/2017 92.4  78.0 - 100.0 fL Final  . MCH 12/08/2017 31.4  26.0 - 34.0 pg Final  . MCHC 12/08/2017 34.0  30.0 - 36.0 g/dL Final  . RDW 12/08/2017 12.8  11.5 - 15.5 % Final  . Platelets  12/08/2017 358  150 - 400 K/uL Final  . Neutrophils Relative % 12/08/2017 53  % Final  . Neutro Abs 12/08/2017 6.4  1.7 - 7.7 K/uL Final  . Lymphocytes Relative 12/08/2017 40  % Final  . Lymphs Abs 12/08/2017 4.8* 0.7 - 4.0 K/uL Final  . Monocytes Relative 12/08/2017 6  %  Final  . Monocytes Absolute 12/08/2017 0.7  0.1 - 1.0 K/uL Final  . Eosinophils Relative 12/08/2017 2  % Final  . Eosinophils Absolute 12/08/2017 0.2  0.0 - 0.7 K/uL Final  . Basophils Relative 12/08/2017 1  % Final  . Basophils Absolute 12/08/2017 0.1  0.0 - 0.1 K/uL Final   Performed at [facility], [address] [place], [place] [address]  . Sodium 12/08/2017 139  135 - 145 mmol/L Final  . Potassium 12/08/2017 3.5  3.5 - 5.1 mmol/L Final  . Chloride 12/08/2017 106  98 - 111 mmol/L Final  . CO2 12/08/2017 24  22 - 32 mmol/L Final  . Glucose, Bld 12/08/2017 105* 70 - 99 mg/dL Final  . BUN 12/08/2017 6  6 - 20 mg/dL Final  . Creatinine, Ser 12/08/2017 0.61  0.44 - 1.00 mg/dL Final  . Calcium 12/08/2017 8.9  8.9 - 10.3 mg/dL Final  . Total Protein 12/08/2017 7.6  6.5 - 8.1 g/dL Final  . Albumin 12/08/2017 3.9  3.5 - 5.0 g/dL Final  . AST 12/08/2017 21  15 - 41 U/L Final  . ALT 12/08/2017 12  0 - 44 U/L Final  . Alkaline Phosphatase 12/08/2017 72  38 - 126 U/L Final  . Total Bilirubin 12/08/2017 0.3  0.3 - 1.2 mg/dL Final  . GFR calc non Af Amer 12/08/2017 >60  >60 mL/min Final  . GFR calc Af Amer 12/08/2017 >60  >60 mL/min Final   Comment: (NOTE) The eGFR has been calculated using the CKD EPI equation. This calculation has not been validated in all clinical situations. eGFR's persistently <60 mL/min signify possible Chronic Kidney Disease.   Georgiann Hahn gap 12/08/2017 9  5 - 15 Final   Performed at [facility], [address], [place], [place] [address]  . LDH 12/08/2017 152  98 - 192 U/L Final   Performed at [facility], [address], [place], [place] [address]  . b2a2 transcript 12/08/2017 Comment  % Final    Comment: (NOTE)           <0.0032 % (sensitivity limit of assay)   . b3a2 transcript 12/08/2017 Comment  % Final   Comment: (NOTE)           <0.0032 % (sensitivity limit of assay)   . E1A2 Transcript 12/08/2017 Comment  % Final   Comment: (NOTE)           <0.0032 % (sensitivity limit of assay)   . Interpretation (BCRAL): 12/08/2017 Comment   Final   Comment: (NOTE) NEGATIVE for the BCR-ABL1 e1a2 (p190), e13a2 (b2a2, p210) and e14a2 (b3a2, p210) fusion transcripts. These results do not rule out the presence of rare BCR-ABL1 transcripts not detected by this assay.   . Director Review (BCRAL): 12/08/2017 Comment   Final   Comment: (NOTE) Katina Degree, MD, PhD Director, [facility] [place], [place] [address] [phone]   . Background: 12/08/2017 Comment   Corrected   Comment: (NOTE) This assay can detect three different types of BCR-ABL1 fusion transcripts associated with CML, ALL, and AML: e13a2 (previously b2a2) and e14a2 (previously b3a2) (major breakpoint, p210), as well as e1a2 (minor breakpoint, p190). The e13a2 and e14a2 transcript values are titrated to the current International Scale (IS). The standardized baseline is 100% BCR-ABL1 (IS) and major molecular response (MMR) is equivalent to 0.1% BCR-ABL1 (IS) corresponding to a 3-log reduction. Results should be correlated with appropriate clinical and laboratory information as indicated.   Marland Kitchen  Methodology 12/08/2017 Comment   Corrected   Comment: (NOTE) Total RNA is isolated from the sample and subject to a real-time, reverse transcriptase polymerase chain reaction (RT-PCR). The PCR primers and probes are specific for BCR-ABL1 e13a2, e14a2 and e1a2 fusion transcripts. The ABL1 transcript is amplified as the control for cDNA quantity and quality. Serial dilutions of a validated positive control RNA with known t(9;22) BCR-ABL1 are used as reference for  quantification of BCR-ABL1 relative to ABL1. The numeric BCR-ABL1 level is reportd as % BCR-ABL1/ABL1 and the detection sensitivity is 4.5 log below the standard baseline. This test was developed and its performance characteristics determined by [facility]. It has not been cleared or approved by the Food and Drug Administration. References:    1. Anastasia Fiedler and Branford S: Seminars in Hematology 2003;       40 (suppl2):62-68.    2. White HE, et al. Blood 2010; 116: e111-117.    3. NCCN Clinical Practice Guidelines in Oncology, Chronic       Myeloid Leukemia. V2. 2017.                           Performed At: [facility] [address] [place], [place] [id number] Nechama Guard MD BO:[id number] Performed At: [facility] [place], [place] [id number] Nechama Guard MD IR:[id number]      Pathology Orders Placed This Encounter  Procedures  . CBC with Differential/Platelet    Standing Status:   Future    Standing Expiration Date:   12/16/2019  . Comprehensive metabolic panel    Standing Status:   Future    Standing Expiration Date:   12/16/2019  . Lactate dehydrogenase    Standing Status:   Future    Standing Expiration Date:   12/16/2019  . BCR-ABL1, CML/ALL, PCR, QUANT    Standing Status:   Future    Standing Expiration Date:   12/16/2019       Zoila Shutter MD

## 2017-12-16 ENCOUNTER — Telehealth (HOSPITAL_COMMUNITY): Payer: Self-pay

## 2017-12-16 NOTE — Telephone Encounter (Signed)
Notified patient that Dr. Walden Field wants to recheck her Chest CT since in 2012 she had some pulmonary nodules and she continues to smoke. Patient verbalized understanding. Messages sent to Financial counselor and scheduling.

## 2017-12-31 ENCOUNTER — Ambulatory Visit (HOSPITAL_COMMUNITY): Payer: BLUE CROSS/BLUE SHIELD

## 2018-01-17 NOTE — Progress Notes (Deleted)
[facility]  PATIENT: [name] DOB: 06-03-1970   REASON FOR VISIT: Follow-up for cerebrovascular disease, headaches, bilateral carotid artery occlusion, TIA HISTORY FROM: Patient    HISTORY OF PRESENT ILLNESS:UPDATE 4/4/2019CM [name], 47 year old female returns for follow-up with significant history of cerebrovascular disease and known bilateral internal carotid artery occlusions.  She has had TIA events in the past.  She also has a history of headaches.  She is currently on Plavix and aspirin for secondary stroke prevention without further TIA or stroke event.  She remains on Crestor for hyperlipidemia .  ER visit in February for headache.  She was taken off Topamax because of side effects.  She has not had a headache since and occasionally takes Aleve if necessary. CT angiogram 03/03/17  appears to show occlusion of both carotid arteries, this is a known issue.  The posterior circulation appears to be unremarkable.  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  The patient continues to smoke.  She claims she is trying to quit she smokes a pack a day.  Patient denies any persistent numbness or weakness in the face arms or legs she denies any balance issues or falls.  She denies any problems controlling bowels or bladder.  She gets little exercise and was encouraged to do so she returns for reevaluation   02/22/17 [name] is a 47 year old left-handed white female with a history of ongoing tobacco abuse and history of significant cerebrovascular disease.  The patient has known bilateral internal carotid artery occlusion, she has had a TIA and strokelike events in the past.  The patient does have a prior history of headaches that had improved significantly until around August 2018.  The headaches have come back again and are occurring 4 or 5 times a month.  The headaches may wake her up from sleep, she generally will take Aleve  usually with good improvement but this medication does not help all the time.  The patient denies any nausea or vomiting with the headache, she does have photophobia without phonophobia.  She may see spots in front of her eyes with a headache.  The patient has also had 2 recent TIA type events both associated with nausea and vomiting.  About 5 minutes after she vomited she noted episodes of right arm weakness that will last 5 or 10 minutes and then clear.  The patient last had an event about 1 month ago.  The patient denies any persistent numbness or weakness of the face, arms, or legs.  She denies any difficulty with balance or difficulty controlling the bowels of the bladder.  The patient again continues to smoke cigarettes.  She comes to the office today for an evaluation.   REVIEW OF SYSTEMS: Full 14 system review of systems performed and notable only for those listed, all others are neg:  Constitutional: neg  Cardiovascular: neg Ear/Nose/Throat: neg  Skin: neg Eyes: neg Respiratory: neg Gastroitestinal: neg  Hematology/Lymphatic: neg  Endocrine: neg Musculoskeletal:neg Allergy/Immunology: neg Neurological: neg Psychiatric: neg Sleep : neg   ALLERGIES: Allergies  Allergen Reactions  . Pantoprazole Sodium Itching and Rash  . Penicillins Itching and Rash    Has patient had a PCN reaction causing immediate rash, facial/tongue/throat swelling, SOB or lightheadedness with hypotension: Yes Has patient had a PCN reaction causing severe rash involving mucus membranes or skin necrosis: No Has patient had a PCN reaction that required hospitalization: No Has patient had a PCN reaction  occurring within the last 10 years: No If all of the above answers are "NO", then may proceed with Cephalosporin use.   . Sulfa Antibiotics Itching and Rash  . Codeine Nausea And Vomiting  . Demerol [Meperidine]     Red streaks on arm after coadministration of phenergan and demerol at time of EGD in 2012.  Tolerated phenergan with subsequent EGD.    HOME MEDICATIONS: Outpatient Medications Prior to Visit  Medication Sig Dispense Refill  . aspirin EC 81 MG tablet Take 81 mg by mouth daily.    . cabergoline (DOSTINEX) 0.5 MG tablet TAKE 1/2 TABLET BY MOUTH WEEKLY 4 tablet 2  . clopidogrel (PLAVIX) 75 MG tablet Take 1 tablet (75 mg total) by mouth daily. 30 tablet 0  . estradiol (ESTRACE) 2 MG tablet TAKE 1 TABLET BY MOUTH ONCE DAILY. 30 tablet 11  . estradiol (ESTRACE) 2 MG tablet TAKE 1 TABLET BY MOUTH ONCE DAILY. 30 tablet 11  . Icosapent Ethyl (VASCEPA) 1 G CAPS Take 2 g by mouth 2 (two) times daily.     . lansoprazole (PREVACID) 30 MG capsule Take 1 capsule (30 mg total) by mouth daily before breakfast. 30 capsule 11  . levothyroxine (SYNTHROID, LEVOTHROID) 50 MCG tablet Take 1 tablet (50 mcg total) by mouth daily. 90 tablet 2  . Multiple Vitamin (MULTIVITAMIN) tablet Take 1 tablet by mouth daily.    . naproxen sodium (ALEVE) 220 MG tablet Take 220 mg by mouth daily as needed (pain).    . ranitidine (ZANTAC) 300 MG tablet TAKE ONE TABLET BY MOUTH AT BEDTIME. 30 tablet 11  . rosuvastatin (CRESTOR) 20 MG tablet Take 20 mg by mouth daily.     No facility-administered medications prior to visit.     PAST MEDICAL HISTORY: Past Medical History:  Diagnosis Date  . Bleeding ulcer   . Bronchitis    hx of  . Carotid artery disease ([place])    bilateral internal carotid artery occlusion by duplex ultrasound  . Cervical cancer (HCC)    cervical  . Chronic headaches   . Common migraine with intractable migraine 02/22/2017  . GERD (gastroesophageal reflux disease)   . Hyperlipidemia   . Hypothyroidism   . Pituitary tumor   . PUD (peptic ulcer disease)    NSAIDS   . Stenosis of right carotid artery   . Stroke ([place])    mini-stroke; memory deficits, slight  . TIA (transient ischemic attack)     PAST SURGICAL HISTORY: Past Surgical History:  Procedure Laterality Date  . ABDOMINAL  HYSTERECTOMY    . BIOPSY  02/23/2017   Procedure: BIOPSY;  Surgeon: Danie Binder, MD;  [facility];  Service: Endoscopy;;  duodenal bx's  . BLADDER SUSPENSION    . ESOPHAGOGASTRODUODENOSCOPY  02/2010   Dr. Barney Drain. Multiple duodenal ulcers, gastric erosions (bx negative for H.Pylori)  . ESOPHAGOGASTRODUODENOSCOPY N/A 06/19/2013   SLF: 1. Earlene Plater /dyspepsia due to uncontrolled GERD, & NSAID gastritis/duodenitis.  Marland Kitchen ESOPHAGOGASTRODUODENOSCOPY (EGD) WITH PROPOFOL N/A 02/23/2017   Procedure: ESOPHAGOGASTRODUODENOSCOPY (EGD) WITH PROPOFOL;  Surgeon: Danie Binder, MD;  [facility];  Service: Endoscopy;  Laterality: N/A;  8:15am  . Multiple cyst removal     coccyx, left ring finger, pelvic area & throat/vocal cord and pilonidal cyst  . RADIOLOGY WITH ANESTHESIA  03/23/2012   Procedure: RADIOLOGY WITH ANESTHESIA;  Surgeon: Rob Hickman, MD;  Location: [place];  Service: Radiology;  Laterality: N/A;  . TOTAL VAGINAL HYSTERECTOMY  FAMILY HISTORY: Family History  Problem Relation Age of Onset  . Diabetes Mother   . COPD Mother   . Heart disease Father   . Cervical cancer Sister   . Colon cancer Neg Hx   . Colon polyps Neg Hx     SOCIAL HISTORY: Social History   Socioeconomic History  . Marital status: Married    Spouse name: Sherren Mocha  . Number of children: 3  . Years of education: 62  . Highest education level: Not on file  Occupational History  . Occupation: unemployed    Fish farm manager: UNEMPLOYED  Social Needs  . Financial resource strain: Not on file  . Food insecurity:    Worry: Not on file    Inability: Not on file  . Transportation needs:    Medical: Not on file    Non-medical: Not on file  Tobacco Use  . Smoking status: Current Every Day Smoker    Packs/day: 1.00    Years: 25.00    Pack years: 25.00    Types: Cigarettes  . Smokeless tobacco: Never Used  Substance and Sexual Activity  . Alcohol use: Yes    Alcohol/week: 14.0 - 28.0  standard drinks    Types: 14 - 28 Cans of beer per week    Comment: drinks 2-4 beers a day; occassional  . Drug use: No  . Sexual activity: Yes    Partners: Male    Birth control/protection: Surgical    Comment: spouse  Lifestyle  . Physical activity:    Days per week: Not on file    Minutes per session: Not on file  . Stress: Not on file  Relationships  . Social connections:    Talks on phone: Not on file    Gets together: Not on file    Attends religious service: Not on file    Active member of club or organization: Not on file    Attends meetings of clubs or organizations: Not on file    Relationship status: Not on file  . Intimate partner violence:    Fear of current or ex partner: Not on file    Emotionally abused: Not on file    Physically abused: Not on file    Forced sexual activity: Not on file  Other Topics Concern  . Not on file  Social History Narrative   Lives with spouse and kids   Caffeine use:      PHYSICAL EXAM  There were no vitals filed for this visit. There is no height or weight on file to calculate BMI.  Generalized: Well developed, in no acute distress  Head: normocephalic and atraumatic,. Oropharynx benign  Neck: Supple, no carotid bruits  Cardiac: Regular rate rhythm, no murmur  Musculoskeletal: No deformity   Neurological examination   Mentation: Alert oriented to time, place, history taking. Attention span and concentration appropriate. Recent and remote memory intact.  Follows all commands speech and language fluent.   Cranial nerve II-XII: Fundoscopic exam reveals sharp disc margins.Pupils were equal round reactive to light extraocular movements were full, visual field were full on confrontational test. Facial sensation and strength were normal. hearing was intact to finger rubbing bilaterally. Uvula tongue midline. head turning and shoulder shrug were normal and symmetric.Tongue protrusion into cheek strength was normal. Motor: normal  bulk and tone, full strength in the BUE, BLE,  Sensory: normal and symmetric to light touch, pinprick, and  Vibration, in the upper and lower extremities, no evidence of extinction Coordination: finger-nose-finger, heel-to-shin  bilaterally, no dysmetria, no tremor Reflexes: Brachioradialis 2/2, biceps 2/2, triceps 2/2, patellar 2/2, Achilles 2/2, plantar responses were flexor bilaterally. Gait and Station: Rising up from seated position without assistance, normal stance,  moderate stride, good arm swing, smooth turning, able to perform tiptoe, and heel walking without difficulty. Tandem gait is steady  DIAGNOSTIC DATA (LABS, IMAGING, TESTING) - I reviewed patient records, labs, notes, testing and imaging myself where available.  Lab Results  Component Value Date   WBC 12.1 (H) 12/08/2017   HGB 12.4 12/08/2017   HCT 36.5 12/08/2017   MCV 92.4 12/08/2017   PLT 358 12/08/2017      Component Value Date/Time   NA 139 12/08/2017 1052   K 3.5 12/08/2017 1052   CL 106 12/08/2017 1052   CO2 24 12/08/2017 1052   GLUCOSE 105 (H) 12/08/2017 1052   BUN 6 12/08/2017 1052   BUN 8 05/18/2017   CREATININE 0.61 12/08/2017 1052   CALCIUM 8.9 12/08/2017 1052   PROT 7.6 12/08/2017 1052   ALBUMIN 3.9 12/08/2017 1052   AST 21 12/08/2017 1052   ALT 12 12/08/2017 1052   ALKPHOS 72 12/08/2017 1052   BILITOT 0.3 12/08/2017 1052   GFRNONAA >60 12/08/2017 1052   GFRAA >60 12/08/2017 1052    Lab Results  Component Value Date   HGBA1C 5.3 12/28/2016   Lab Results  Component Value Date   JEHUDJSH70 263 02/05/2016   Lab Results  Component Value Date   TSH 0.32 (L) 12/06/2017      ASSESSMENT AND PLAN  47 y.o. year old female here to follow-up for 1Common migraine headache 2.  Cerebrovascular disease, bilateral carotid artery occlusion 3.  TIA events CT angiogram 03/03/17  appears to show occlusion of both carotid arteries, this is a known issue.  The posterior circulation appears to be  unremarkable.  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    PLAN: Stressed the importance of management of risk factors to prevent further stroke Continue Plavix and aspirin for secondary stroke prevention/TIA Maintain strict control of hypertension with blood pressure goal below 130/90, today's reading 110/73 Cholesterol with LDL cholesterol less than 70, followed by primary care,  continue statin drug Crestor Headaches are stable at present Exercise by walking, 30 min daily Stop smoking  eat healthy diet with whole grains,  fresh fruits and vegetables Follow-up with primary care for stroke risk factor modification, maintain blood pressure goal less than 785 systolic, diabetes with Y8F below 7, lipids with LDL below 70 F/U in 6 months I spent 25 minutes in total face to face time with the patient more than 50% of which was spent counseling and coordination of care, reviewing test results reviewing medications and discussing and reviewing the diagnosis of stroke/TIA and management of risk factors Dennie Bible, [facility], [facility], APRN  [facility] [address] [place], [place] [address] [phone]

## 2018-01-19 ENCOUNTER — Ambulatory Visit: Payer: BLUE CROSS/BLUE SHIELD | Admitting: Nurse Practitioner

## 2018-01-20 ENCOUNTER — Encounter: Payer: Self-pay | Admitting: Nurse Practitioner

## 2018-01-27 ENCOUNTER — Other Ambulatory Visit: Payer: Self-pay | Admitting: Gastroenterology

## 2018-02-28 ENCOUNTER — Other Ambulatory Visit: Payer: Self-pay | Admitting: "Endocrinology

## 2018-03-17 ENCOUNTER — Other Ambulatory Visit: Payer: Self-pay

## 2018-03-17 MED ORDER — LEVOTHYROXINE SODIUM 50 MCG PO TABS: 50.0000 ug | ORAL_TABLET | Freq: Every day | ORAL | 1 refills | Status: DC

## 2018-05-27 ENCOUNTER — Other Ambulatory Visit: Payer: Self-pay | Admitting: Nurse Practitioner

## 2018-06-03 ENCOUNTER — Other Ambulatory Visit: Payer: Self-pay | Admitting: "Endocrinology

## 2018-06-06 LAB — SPECIMEN STATUS

## 2018-06-07 LAB — SPECIMEN STATUS REPORT

## 2018-06-09 LAB — CORTISOL-AM, BLOOD: CORTISOL - AM: 13.2 ug/dL (ref 6.2–19.4)

## 2018-06-09 LAB — PROLACTIN: Prolactin: 0.3 ng/mL — ABNORMAL LOW (ref 4.8–23.3)

## 2018-06-09 LAB — TSH: TSH: 1.12 u[IU]/mL (ref 0.450–4.500)

## 2018-06-09 LAB — T4, FREE: Free T4: 1.23 ng/dL (ref 0.82–1.77)

## 2018-06-09 LAB — SPECIMEN STATUS REPORT

## 2018-06-13 ENCOUNTER — Ambulatory Visit (INDEPENDENT_AMBULATORY_CARE_PROVIDER_SITE_OTHER): Payer: BLUE CROSS/BLUE SHIELD | Admitting: "Endocrinology

## 2018-06-13 ENCOUNTER — Encounter: Payer: Self-pay | Admitting: "Endocrinology

## 2018-06-13 VITALS — BP 125/85 | HR 80 | Resp 12 | Ht 62.0 in | Wt 106.8 lb

## 2018-06-13 DIAGNOSIS — E221 Hyperprolactinemia: Secondary | ICD-10-CM

## 2018-06-13 DIAGNOSIS — E038 Other specified hypothyroidism: Secondary | ICD-10-CM

## 2018-06-13 MED ORDER — LEVOTHYROXINE SODIUM 50 MCG PO TABS: 50.0000 ug | ORAL_TABLET | Freq: Every day | ORAL | 1 refills | Status: DC

## 2018-06-13 NOTE — Progress Notes (Signed)
Endocrinology follow-up note   Subjective:    Patient ID: [name], female    DOB: 1971-03-27, PCP Barry Dienes, NP   Past Medical History:  Diagnosis Date  . Bleeding ulcer   . Bronchitis    hx of  . Carotid artery disease ([place])    bilateral internal carotid artery occlusion by duplex ultrasound  . Cervical cancer (HCC)    cervical  . Chronic headaches   . Common migraine with intractable migraine 02/22/2017  . GERD (gastroesophageal reflux disease)   . Hyperlipidemia   . Hypothyroidism   . Pituitary tumor   . PUD (peptic ulcer disease)    NSAIDS   . Stenosis of right carotid artery   . Stroke ([place])    mini-stroke; memory deficits, slight  . TIA (transient ischemic attack)    Past Surgical History:  Procedure Laterality Date  . ABDOMINAL HYSTERECTOMY    . BIOPSY  02/23/2017   Procedure: BIOPSY;  Surgeon: Danie Binder, MD;  [facility];  Service: Endoscopy;;  duodenal bx's  . BLADDER SUSPENSION    . ESOPHAGOGASTRODUODENOSCOPY  02/2010   Dr. Barney Drain. Multiple duodenal ulcers, gastric erosions (bx negative for H.Pylori)  . ESOPHAGOGASTRODUODENOSCOPY N/A 06/19/2013   SLF: 1. Earlene Plater /dyspepsia due to uncontrolled GERD, & NSAID gastritis/duodenitis.  Marland Kitchen ESOPHAGOGASTRODUODENOSCOPY (EGD) WITH PROPOFOL N/A 02/23/2017   Procedure: ESOPHAGOGASTRODUODENOSCOPY (EGD) WITH PROPOFOL;  Surgeon: Danie Binder, MD;  [facility];  Service: Endoscopy;  Laterality: N/A;  8:15am  . Multiple cyst removal     coccyx, left ring finger, pelvic area & throat/vocal cord and pilonidal cyst  . RADIOLOGY WITH ANESTHESIA  03/23/2012   Procedure: RADIOLOGY WITH ANESTHESIA;  Surgeon: Rob Hickman, MD;  Location: [facility];  Service: Radiology;  Laterality: N/A;  . TOTAL VAGINAL HYSTERECTOMY     Social History   Socioeconomic History  . Marital status: Married    Spouse name: [name]  . Number of children: 3  . Years of education: 69  . Highest education level:  Not on file  Occupational History  . Occupation: unemployed    Fish farm manager: UNEMPLOYED  Social Needs  . Financial resource strain: Not on file  . Food insecurity:    Worry: Not on file    Inability: Not on file  . Transportation needs:    Medical: Not on file    Non-medical: Not on file  Tobacco Use  . Smoking status: Current Every Day Smoker    Packs/day: 1.00    Years: 25.00    Pack years: 25.00    Types: Cigarettes  . Smokeless tobacco: Never Used  Substance and Sexual Activity  . Alcohol use: Yes    Alcohol/week: 14.0 - 28.0 standard drinks    Types: 14 - 28 Cans of beer per week    Comment: drinks 2-4 beers a day; occassional  . Drug use: No  . Sexual activity: Yes    Partners: Male    Birth control/protection: Surgical    Comment: spouse  Lifestyle  . Physical activity:    Days per week: Not on file    Minutes per session: Not on file  . Stress: Not on file  Relationships  . Social connections:    Talks on phone: Not on file    Gets together: Not on file    Attends religious service: Not on file    Active member of club or organization: Not on file    Attends meetings  of clubs or organizations: Not on file    Relationship status: Not on file  Other Topics Concern  . Not on file  Social History Narrative   Lives with spouse and kids   Caffeine use:    Outpatient Encounter Medications as of 06/13/2018  Medication Sig  . aspirin EC 81 MG tablet Take 81 mg by mouth daily.  . cabergoline (DOSTINEX) 0.5 MG tablet TAKE 1/2 TABLET BY MOUTH WEEKLY  . clopidogrel (PLAVIX) 75 MG tablet Take 1 tablet (75 mg total) by mouth daily.  Marland Kitchen estradiol (ESTRACE) 2 MG tablet TAKE 1 TABLET BY MOUTH ONCE DAILY.  Marland Kitchen estradiol (ESTRACE) 2 MG tablet TAKE 1 TABLET BY MOUTH ONCE DAILY.  Marland Kitchen Icosapent Ethyl (VASCEPA) 1 G CAPS Take 2 g by mouth 2 (two) times daily.   . lansoprazole (PREVACID) 30 MG capsule TAKE 1 CAPSULE IN THE MORNING (1 HOUR BEFORE BREAKFAST)  . levothyroxine (SYNTHROID,  LEVOTHROID) 50 MCG tablet Take 1 tablet (50 mcg total) by mouth daily.  . Multiple Vitamin (MULTIVITAMIN) tablet Take 1 tablet by mouth daily.  . naproxen sodium (ALEVE) 220 MG tablet Take 220 mg by mouth daily as needed (pain).  . ranitidine (ZANTAC) 300 MG tablet TAKE ONE TABLET BY MOUTH AT BEDTIME.  . rosuvastatin (CRESTOR) 20 MG tablet Take 20 mg by mouth daily.  . [DISCONTINUED] cabergoline (DOSTINEX) 0.5 MG tablet TAKE 1/2 TABLET BY MOUTH TWICE A WEEK.  . [DISCONTINUED] levothyroxine (SYNTHROID, LEVOTHROID) 50 MCG tablet Take 1 tablet (50 mcg total) by mouth daily.   No facility-administered encounter medications on file as of 06/13/2018.    ALLERGIES: Allergies  Allergen Reactions  . Pantoprazole Sodium Itching and Rash  . Penicillins Itching and Rash    Has patient had a PCN reaction causing immediate rash, facial/tongue/throat swelling, SOB or lightheadedness with hypotension: Yes Has patient had a PCN reaction causing severe rash involving mucus membranes or skin necrosis: No Has patient had a PCN reaction that required hospitalization: No Has patient had a PCN reaction occurring within the last 10 years: No If all of the above answers are "NO", then may proceed with Cephalosporin use.   . Sulfa Antibiotics Itching and Rash  . Codeine Nausea And Vomiting  . Demerol [Meperidine]     Red streaks on arm after coadministration of phenergan and demerol at time of EGD in 2012. Tolerated phenergan with subsequent EGD.    VACCINATION STATUS:  There is no immunization history on file for this patient.  HPI 48 year old female with medical history as above.   She is here today to follow-up for her hypothyroidism, hyperprolactinemia /pituitary microadenoma, and history of adrenal insufficiency. -She continues to feel better.  She mains on low-dose cabergoline 0.25 mg once a week.  She is compliant with her levothyroxine 50 mcg p.o. every morning for hypothyroidism.  She reports  compliance to her medications.  She has no new complaints today.  She denies nausea, vomiting.  Denies palpitations, tremors, hot flashes.   -  she denies any recent hospitalization for adrenal crisis. - She has history of leukocytosis/ thrombocytosis, bilateral carotid artery stenosis complicated by cerebrovascular accident.   Review of Systems  Constitutional: + Light build-progressively losing  weight, - fatigue, +subjective  hypothermia Eyes: no blurry vision, no xerophthalmia ENT: no sore throat, no nodules palpated in throat, no dysphagia/odynophagia, no hoarseness Musculoskeletal: no muscle/joint aches Skin:  - rashes Neurological: no tremors, no numbness, no tingling, no dizziness Psychiatric: no depression, no anxiety  Objective:  BP 125/85   Pulse 80   Resp 12   Ht 5\' 2"  (1.575 m)   Wt 106 lb 12.8 oz (48.4 kg)   SpO2 100% Comment: room air  BMI 19.53 kg/m   Wt Readings from Last 3 Encounters:  06/13/18 106 lb 12.8 oz (48.4 kg)  12/15/17 103 lb 11.2 oz (47 kg)  12/09/17 103 lb (46.7 kg)    Physical Exam  Constitutional:  + light build, not in acute distress, normal state of mind Eyes: PERRLA, EOMI, no exophthalmos ENT:   Moist mucous membranes,  no thyromegaly, no cervical lymphadenopathy Musculoskeletal: no gross deformities, strength intact in all four extremities Skin: moist,    normal skin color, +no active rashes Neurological: no tremor with outstretched hands   CMP     Component Value Date/Time   NA 139 12/08/2017 1052   K 3.5 12/08/2017 1052   CL 106 12/08/2017 1052   CO2 24 12/08/2017 1052   GLUCOSE 105 (H) 12/08/2017 1052   BUN 6 12/08/2017 1052   BUN 8 05/18/2017   CREATININE 0.61 12/08/2017 1052   CALCIUM 8.9 12/08/2017 1052   PROT 7.6 12/08/2017 1052   ALBUMIN 3.9 12/08/2017 1052   AST 21 12/08/2017 1052   ALT 12 12/08/2017 1052   ALKPHOS 72 12/08/2017 1052   BILITOT 0.3 12/08/2017 1052   GFRNONAA >60 12/08/2017 1052   GFRAA >60  12/08/2017 1052      Results for SHANTELLA, BLUBAUGH (MRN [id number]) as of 06/13/2018 11:31  Ref. Range 06/03/2018 00:00  Cortisol - AM Latest Ref Range: 6.2 - 19.4 ug/dL 13.2  Prolactin Latest Ref Range: 4.8 - 23.3 ng/mL 0.3 (L)  TSH Latest Ref Range: 0.450 - 4.500 uIU/mL 1.120  T4,Free(Direct) Latest Ref Range: 0.82 - 1.77 ng/dL 1.23    Labs from 02/05/2016 showed TSH 1.68, free T4 0.49   Labs from 12/21/2016: TSH 1.7, free T4 0.79 (normal 0.8-1 277), sodium 140, potassium 5.4, leukocytosis at 13, normal lipid panel.  Assessment & Plan:   1. Hypothyroidism 2.  History of adrenal insufficiency ([place]) 3. Hyperprolactinemia ([place]) 4. 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 She denied any interval hospitalizations or adrenal crisis.    -Her most recent a.m. cortisol is reassuring at 13.2.  She will not require adrenal intervention at this time.  she will need a.m. cortisol at least every year for reevaluation. - Regarding her history of hyperprolactinemia related to history microadenoma (7 mm pituitary adenoma based on MRI in 2012). - Her previsit prolactin level is 0.3 , last visit was <1.0 .  She had normal prolactin for the last 2 years.  She will be tried off of cabergoline at this time with plan to repeat prolactin in 6 months. Her 2014 pituitary/sella MRI was significant for 7 mm pituitary microadenoma.  She will not require any surveillance MRI of sella/pituitary this time.   -Her previsit thyroid function tests are consistent with appropriate replacement with levothyroxine 50 mcg p.o. every morning.    - We discussed about the correct intake of her thyroid hormone, on empty stomach at fasting, with water, separated by at least 30 minutes from breakfast and other  medications,  and separated by more than 4 hours from calcium, iron, multivitamins, acid reflux medications (PPIs). -  Patient is made aware of the fact that thyroid hormone replacement is needed for life, dose to be adjusted by periodic monitoring of thyroid function tests.  - Due to her body habitus being light build and propensity for multiple autoimmune dysfunction including premature surgical menopause, she will need a screening bone density test for osteoporosis .  This test would be scheduled to be ordered in [facility] as soon as possible.  - I advised patient to maintain close follow up with Barry Dienes, NP for primary care needs.  Follow up plan: Return in about 6 months (around 12/14/2018) for Follow up with Pre-visit Labs.  Glade Lloyd, MD Phone: [phone]  Fax: [phone]  This note was partially dictated with voice recognition software. Similar sounding words can be transcribed inadequately or may not  be corrected upon review.  06/13/2018, 11:48 AM

## 2018-06-17 ENCOUNTER — Inpatient Hospital Stay (HOSPITAL_COMMUNITY): Payer: BLUE CROSS/BLUE SHIELD | Attending: Hematology

## 2018-06-17 ENCOUNTER — Other Ambulatory Visit: Payer: Self-pay

## 2018-06-17 DIAGNOSIS — Z8049 Family history of malignant neoplasm of other genital organs: Secondary | ICD-10-CM | POA: Insufficient documentation

## 2018-06-17 DIAGNOSIS — D72829 Elevated white blood cell count, unspecified: Secondary | ICD-10-CM | POA: Insufficient documentation

## 2018-06-17 DIAGNOSIS — F17210 Nicotine dependence, cigarettes, uncomplicated: Secondary | ICD-10-CM | POA: Insufficient documentation

## 2018-06-17 DIAGNOSIS — R918 Other nonspecific abnormal finding of lung field: Secondary | ICD-10-CM | POA: Insufficient documentation

## 2018-06-17 DIAGNOSIS — Z8541 Personal history of malignant neoplasm of cervix uteri: Secondary | ICD-10-CM | POA: Diagnosis not present

## 2018-06-17 DIAGNOSIS — D72828 Other elevated white blood cell count: Secondary | ICD-10-CM

## 2018-06-17 DIAGNOSIS — D352 Benign neoplasm of pituitary gland: Secondary | ICD-10-CM | POA: Insufficient documentation

## 2018-06-17 DIAGNOSIS — E2740 Unspecified adrenocortical insufficiency: Secondary | ICD-10-CM | POA: Diagnosis not present

## 2018-06-17 LAB — CBC WITH DIFFERENTIAL/PLATELET
Abs Immature Granulocytes: 0.02 10*3/uL (ref 0.00–0.07)
Basophils Absolute: 0.1 10*3/uL (ref 0.0–0.1)
Basophils Relative: 1 %
EOS ABS: 0.4 10*3/uL (ref 0.0–0.5)
Eosinophils Relative: 3 %
HCT: 42.9 % (ref 36.0–46.0)
Hemoglobin: 13.3 g/dL (ref 12.0–15.0)
Immature Granulocytes: 0 %
Lymphocytes Relative: 44 %
Lymphs Abs: 5.4 10*3/uL — ABNORMAL HIGH (ref 0.7–4.0)
MCH: 29 pg (ref 26.0–34.0)
MCHC: 31 g/dL (ref 30.0–36.0)
MCV: 93.7 fL (ref 80.0–100.0)
Monocytes Absolute: 0.7 10*3/uL (ref 0.1–1.0)
Monocytes Relative: 6 %
Neutro Abs: 5.6 10*3/uL (ref 1.7–7.7)
Neutrophils Relative %: 46 %
Platelets: 375 10*3/uL (ref 150–400)
RBC: 4.58 MIL/uL (ref 3.87–5.11)
RDW: 12.3 % (ref 11.5–15.5)
WBC: 12.1 10*3/uL — ABNORMAL HIGH (ref 4.0–10.5)
nRBC: 0 % (ref 0.0–0.2)

## 2018-06-17 LAB — COMPREHENSIVE METABOLIC PANEL
ALT: 14 U/L (ref 0–44)
AST: 20 U/L (ref 15–41)
Albumin: 4.1 g/dL (ref 3.5–5.0)
Alkaline Phosphatase: 76 U/L (ref 38–126)
Anion gap: 7 (ref 5–15)
BUN: 8 mg/dL (ref 6–20)
CO2: 28 mmol/L (ref 22–32)
Calcium: 9 mg/dL (ref 8.9–10.3)
Chloride: 107 mmol/L (ref 98–111)
Creatinine, Ser: 0.63 mg/dL (ref 0.44–1.00)
GFR calc Af Amer: >60 mL/min (ref >60)
GFR calc non Af Amer: >60 mL/min (ref >60)
Glucose, Bld: 87 mg/dL (ref 70–99)
POTASSIUM: 4.6 mmol/L (ref 3.5–5.1)
Sodium: 142 mmol/L (ref 135–145)
TOTAL PROTEIN: 7.8 g/dL (ref 6.5–8.1)
Total Bilirubin: 0.3 mg/dL (ref 0.3–1.2)

## 2018-06-17 LAB — LACTATE DEHYDROGENASE: LDH: 138 U/L (ref 98–192)

## 2018-06-23 LAB — BCR-ABL1, CML/ALL, PCR, QUANT

## 2018-06-24 ENCOUNTER — Encounter (HOSPITAL_COMMUNITY): Payer: Self-pay | Admitting: Hematology

## 2018-06-24 ENCOUNTER — Other Ambulatory Visit: Payer: Self-pay

## 2018-06-24 ENCOUNTER — Inpatient Hospital Stay (HOSPITAL_BASED_OUTPATIENT_CLINIC_OR_DEPARTMENT_OTHER): Payer: BLUE CROSS/BLUE SHIELD | Admitting: Hematology

## 2018-06-24 VITALS — BP 114/73 | HR 83 | Temp 97.5°F | Resp 17 | Wt 107.9 lb

## 2018-06-24 DIAGNOSIS — R918 Other nonspecific abnormal finding of lung field: Secondary | ICD-10-CM | POA: Diagnosis not present

## 2018-06-24 DIAGNOSIS — D72829 Elevated white blood cell count, unspecified: Secondary | ICD-10-CM | POA: Diagnosis not present

## 2018-06-24 DIAGNOSIS — E2740 Unspecified adrenocortical insufficiency: Secondary | ICD-10-CM | POA: Diagnosis not present

## 2018-06-24 DIAGNOSIS — Z8541 Personal history of malignant neoplasm of cervix uteri: Secondary | ICD-10-CM

## 2018-06-24 DIAGNOSIS — Z8049 Family history of malignant neoplasm of other genital organs: Secondary | ICD-10-CM

## 2018-06-24 DIAGNOSIS — D352 Benign neoplasm of pituitary gland: Secondary | ICD-10-CM | POA: Diagnosis not present

## 2018-06-24 DIAGNOSIS — F17210 Nicotine dependence, cigarettes, uncomplicated: Secondary | ICD-10-CM

## 2018-06-24 NOTE — Patient Instructions (Addendum)
[place] at [facility] Discharge Instructions  You were seen today by Dr. Delton Coombes. He went over your recent lab results. He would like you to have a CT of your chest in 6 months before your follow up appointment.  He will see you back in 6 months for labs and follow up.    Thank you for choosing [place] at [facility] to provide your oncology and hematology care.  To afford each patient quality time with our provider, please arrive at least 15 minutes before your scheduled appointment time.   If you have a lab appointment with the [facility] please come in thru the  Main Entrance and check in at the main information desk  You need to re-schedule your appointment should you arrive 10 or more minutes late.  We strive to give you quality time with our providers, and arriving late affects you and other patients whose appointments are after yours.  Also, if you no show three or more times for appointments you may be dismissed from the clinic at the providers discretion.     Again, thank you for choosing [facility].  Our hope is that these requests will decrease the amount of time that you wait before being seen by our physicians.       _____________________________________________________________  Should you have questions after your visit to [facility], please contact our office at [phone] between the hours of 8:00 a.m. and 4:30 p.m.  Voicemails left after 4:00 p.m. will not be returned until the following business day.  For prescription refill requests, have your pharmacy contact our office and allow 72 hours.    Cancer Center Support Programs:   > Cancer Support Group  2nd Tuesday of the month 1pm-2pm, Journey Room

## 2018-06-24 NOTE — Progress Notes (Signed)
[name], [place] [address]   CLINIC:  Medical Oncology/Hematology  PCP:  Barry Dienes, NP No address on file None   REASON FOR VISIT:  Follow-up for Leukocytosis, Pituitary adenoma/prolactinoma with adrenal insufficiency, Pulmonary nodule   INTERVAL HISTORY:  [name] 48 y.o. female returns for routine follow-up. She is here today by herself. She states that she has noticed recent weight gain. She states that she still smokes. Denies any nausea, vomiting, or diarrhea. Denies any new pains. Had not noticed any recent bleeding such as epistaxis, hematuria or hematochezia. Denies recent chest pain on exertion, shortness of breath on minimal exertion, pre-syncopal episodes, or palpitations. Denies any numbness or tingling in hands or feet. Denies any recent fevers, infections, or recent hospitalizations. Patient reports appetite at 100% and energy level at 25%.  REVIEW OF SYSTEMS:  Review of Systems  Constitutional: Positive for fatigue.     PAST MEDICAL/SURGICAL HISTORY:  Past Medical History:  Diagnosis Date  . Bleeding ulcer   . Bronchitis    hx of  . Carotid artery disease ([place])    bilateral internal carotid artery occlusion by duplex ultrasound  . Cervical cancer ([facility])    cervical  . Chronic headaches   . Common migraine with intractable migraine 02/22/2017  . GERD (gastroesophageal reflux disease)   . Hyperlipidemia   . Hypothyroidism   . Pituitary tumor   . PUD (peptic ulcer disease)    NSAIDS   . Stenosis of right carotid artery   . Stroke ([place])    mini-stroke; memory deficits, slight  . TIA (transient ischemic attack)    Past Surgical History:  Procedure Laterality Date  . ABDOMINAL HYSTERECTOMY    . BIOPSY  02/23/2017   Procedure: BIOPSY;  Surgeon: Danie Binder, MD;  [facility];  Service: Endoscopy;;  duodenal bx's  . BLADDER SUSPENSION    . ESOPHAGOGASTRODUODENOSCOPY  02/2010   Dr. Barney Drain.  Multiple duodenal ulcers, gastric erosions (bx negative for H.Pylori)  . ESOPHAGOGASTRODUODENOSCOPY N/A 06/19/2013   SLF: 1. Earlene Plater /dyspepsia due to uncontrolled GERD, & NSAID gastritis/duodenitis.  Marland Kitchen ESOPHAGOGASTRODUODENOSCOPY (EGD) WITH PROPOFOL N/A 02/23/2017   Procedure: ESOPHAGOGASTRODUODENOSCOPY (EGD) WITH PROPOFOL;  Surgeon: Danie Binder, MD;  Location: [facility];  Service: Endoscopy;  Laterality: N/A;  8:15am  . Multiple cyst removal     coccyx, left ring finger, pelvic area & throat/vocal cord and pilonidal cyst  . RADIOLOGY WITH ANESTHESIA  03/23/2012   Procedure: RADIOLOGY WITH ANESTHESIA;  Surgeon: Rob Hickman, MD;  Location: [facility];  Service: Radiology;  Laterality: N/A;  . TOTAL VAGINAL HYSTERECTOMY       SOCIAL HISTORY:  Social History   Socioeconomic History  . Marital status: Married    Spouse name: Sherren Mocha  . Number of children: 3  . Years of education: 1  . Highest education level: Not on file  Occupational History  . Occupation: unemployed    Fish farm manager: UNEMPLOYED  Social Needs  . Financial resource strain: Not on file  . Food insecurity:    Worry: Not on file    Inability: Not on file  . Transportation needs:    Medical: Not on file    Non-medical: Not on file  Tobacco Use  . Smoking status: Current Every Day Smoker    Packs/day: 1.00    Years: 25.00    Pack years: 25.00    Types: Cigarettes  . Smokeless tobacco: Never Used  Substance  and Sexual Activity  . Alcohol use: Yes    Alcohol/week: 14.0 - 28.0 standard drinks    Types: 14 - 28 Cans of beer per week    Comment: drinks 2-4 beers a day; occassional  . Drug use: No  . Sexual activity: Yes    Partners: Male    Birth control/protection: Surgical    Comment: spouse  Lifestyle  . Physical activity:    Days per week: Not on file    Minutes per session: Not on file  . Stress: Not on file  Relationships  . Social connections:    Talks on phone: Not on file    Gets together: Not on  file    Attends religious service: Not on file    Active member of club or organization: Not on file    Attends meetings of clubs or organizations: Not on file    Relationship status: Not on file  . Intimate partner violence:    Fear of current or ex partner: Not on file    Emotionally abused: Not on file    Physically abused: Not on file    Forced sexual activity: Not on file  Other Topics Concern  . Not on file  Social History Narrative   Lives with spouse and kids   Caffeine use:     FAMILY HISTORY:  Family History  Problem Relation Age of Onset  . Diabetes Mother   . COPD Mother   . Heart disease Father   . Cervical cancer Sister   . Colon cancer Neg Hx   . Colon polyps Neg Hx     CURRENT MEDICATIONS:  Outpatient Encounter Medications as of 06/24/2018  Medication Sig  . aspirin EC 81 MG tablet Take 81 mg by mouth daily.  . clopidogrel (PLAVIX) 75 MG tablet Take 1 tablet (75 mg total) by mouth daily.  Marland Kitchen estradiol (ESTRACE) 2 MG tablet TAKE 1 TABLET BY MOUTH ONCE DAILY.  Marland Kitchen Icosapent Ethyl (VASCEPA) 1 G CAPS Take 2 g by mouth 2 (two) times daily.   . lansoprazole (PREVACID) 30 MG capsule TAKE 1 CAPSULE IN THE MORNING (1 HOUR BEFORE BREAKFAST)  . levothyroxine (SYNTHROID, LEVOTHROID) 50 MCG tablet Take 1 tablet (50 mcg total) by mouth daily.  . Multiple Vitamin (MULTIVITAMIN) tablet Take 1 tablet by mouth daily.  . naproxen sodium (ALEVE) 220 MG tablet Take 220 mg by mouth daily as needed (pain).  . ranitidine (ZANTAC) 300 MG tablet TAKE ONE TABLET BY MOUTH AT BEDTIME.  . rosuvastatin (CRESTOR) 20 MG tablet Take 20 mg by mouth daily.  . [DISCONTINUED] cabergoline (DOSTINEX) 0.5 MG tablet TAKE 1/2 TABLET BY MOUTH WEEKLY  . [DISCONTINUED] estradiol (ESTRACE) 2 MG tablet TAKE 1 TABLET BY MOUTH ONCE DAILY.   No facility-administered encounter medications on file as of 06/24/2018.     ALLERGIES:  Allergies  Allergen Reactions  . Pantoprazole Sodium Itching and Rash  .  Penicillins Itching and Rash    Has patient had a PCN reaction causing immediate rash, facial/tongue/throat swelling, SOB or lightheadedness with hypotension: Yes Has patient had a PCN reaction causing severe rash involving mucus membranes or skin necrosis: No Has patient had a PCN reaction that required hospitalization: No Has patient had a PCN reaction occurring within the last 10 years: No If all of the above answers are "NO", then may proceed with Cephalosporin use.   . Sulfa Antibiotics Itching and Rash  . Codeine Nausea And Vomiting  . Demerol [Meperidine]  Red streaks on arm after coadministration of phenergan and demerol at time of EGD in 2012. Tolerated phenergan with subsequent EGD.     PHYSICAL EXAM:  ECOG Performance status: 1  Vitals:   06/24/18 1434  BP: 114/73  Pulse: 83  Resp: 17  Temp: (!) 97.5 F (36.4 C)  SpO2: 100%   Filed Weights   06/24/18 1434  Weight: 107 lb 14.4 oz (48.9 kg)    Physical Exam Constitutional:      Appearance: Normal appearance.  Cardiovascular:     Rate and Rhythm: Normal rate and regular rhythm.  Pulmonary:     Effort: Pulmonary effort is normal.     Breath sounds: Normal breath sounds.  Abdominal:     General: Bowel sounds are normal. There is no distension.     Palpations: Abdomen is soft.  Musculoskeletal:        General: No swelling.  Skin:    General: Skin is warm.  Neurological:     General: No focal deficit present.     Mental Status: She is alert and oriented to person, place, and time.  Psychiatric:        Mood and Affect: Mood normal.        Behavior: Behavior normal.      LABORATORY DATA:  I have reviewed the labs as listed.  CBC    Component Value Date/Time   WBC 12.1 (H) 06/17/2018 1109   RBC 4.58 06/17/2018 1109   HGB 13.3 06/17/2018 1109   HGB WILL FOLLOW 06/03/2018 0000   HCT 42.9 06/17/2018 1109   HCT WILL FOLLOW 06/03/2018 0000   HCT 43 07/16/2010 1706   PLT 375 06/17/2018 1109   PLT  WILL FOLLOW 06/03/2018 0000   MCV 93.7 06/17/2018 1109   MCV WILL FOLLOW 06/03/2018 0000   MCH 29.0 06/17/2018 1109   MCHC 31.0 06/17/2018 1109   RDW 12.3 06/17/2018 1109   RDW WILL FOLLOW 06/03/2018 0000   LYMPHSABS 5.4 (H) 06/17/2018 1109   LYMPHSABS WILL FOLLOW 06/03/2018 0000   MONOABS 0.7 06/17/2018 1109   EOSABS 0.4 06/17/2018 1109   EOSABS WILL FOLLOW 06/03/2018 0000   BASOSABS 0.1 06/17/2018 1109   BASOSABS WILL FOLLOW 06/03/2018 0000   CMP Latest Ref Rng & Units 06/17/2018 12/08/2017 07/02/2017  Glucose 70 - 99 mg/dL 87 105(H) 96  BUN 6 - 20 mg/dL 8 6 11   Creatinine 0.44 - 1.00 mg/dL 0.63 0.61 0.60  Sodium 135 - 145 mmol/L 142 139 140  Potassium 3.5 - 5.1 mmol/L 4.6 3.5 3.5  Chloride 98 - 111 mmol/L 107 106 108  CO2 22 - 32 mmol/L 28 24 22   Calcium 8.9 - 10.3 mg/dL 9.0 8.9 9.3  Total Protein 6.5 - 8.1 g/dL 7.8 7.6 7.5  Total Bilirubin 0.3 - 1.2 mg/dL 0.3 0.3 0.5  Alkaline Phos 38 - 126 U/L 76 72 72  AST 15 - 41 U/L 20 21 17   ALT 0 - 44 U/L 14 12 10(L)       DIAGNOSTIC IMAGING:  I have independently reviewed the scans and discussed with the patient.   I have reviewed Venita Lick LPN's note and agree with the documentation.  I personally performed a face-to-face visit, made revisions and my assessment and plan is as follows.    ASSESSMENT & PLAN:   Leukocytosis 1.  Leukocytosis: -She had leukocytosis since 2012. -Testing for Jak 2 V6 100F was negative.  BCR/ABL by PCR was initially very low positive.  Subsequent  tests were negative. - Bone marrow biopsy on 08/04/2017 shows a slightly hypercellular marrow with trilineage hematopoiesis.  46, 8 6.  FISH panel was negative for BCR/ABL. - Denies any B symptoms.  Denies any recent or recurrent infections. -Today her CBC shows white count of 12.1.  There is slight increase in lymphocytosis for the last 10 months.  If they continue to be high, will consider a flow cytometry. -I will see her back in 6 months for follow-up  with repeat labs.  2.  Smoking history: -She smoked 1 pack/day for at least 25 years. -CT scan of the chest in 2014 showed scattered lung nodules. - I have recommended a low-dose CT without contrast of the chest prior to next visit in 6 months.      Orders placed this encounter:  Orders Placed This Encounter  Procedures  . CT CHEST NODULE FOLLOW UP LOW DOSE WO CM  . CBC with Differential/Platelet  . Comprehensive metabolic panel  . Lactate dehydrogenase      Derek Jack, MD [place] [phone]

## 2018-06-24 NOTE — Assessment & Plan Note (Signed)
1.  Leukocytosis: -She had leukocytosis since 2012. -Testing for Jak 2 V6 18F was negative.  BCR/ABL by PCR was initially very low positive.  Subsequent tests were negative. - Bone marrow biopsy on 08/04/2017 shows a slightly hypercellular marrow with trilineage hematopoiesis.  46, 8 6.  FISH panel was negative for BCR/ABL. - Denies any B symptoms.  Denies any recent or recurrent infections. -Today her CBC shows white count of 12.1.  There is slight increase in lymphocytosis for the last 10 months.  If they continue to be high, will consider a flow cytometry. -I will see her back in 6 months for follow-up with repeat labs.  2.  Smoking history: -She smoked 1 pack/day for at least 25 years. -CT scan of the chest in 2014 showed scattered lung nodules. - I have recommended a low-dose CT without contrast of the chest prior to next visit in 6 months.

## 2018-06-27 ENCOUNTER — Other Ambulatory Visit: Payer: Self-pay | Admitting: Gastroenterology

## 2018-08-16 ENCOUNTER — Other Ambulatory Visit: Payer: Self-pay | Admitting: "Endocrinology

## 2018-08-28 IMAGING — CT CT ANGIO NECK
1 of 18 series · 2 of 33 positions shown · IV contrast (iopamidol)
Comparison: Head CT 09/12/2012

Brain MRI 05/13/2012

CLINICAL DATA: Headaches. History of stroke, right carotid stenosis
and pituitary tumor.

EXAM:
CT ANGIOGRAPHY HEAD AND NECK
TECHNIQUE: Multidetector CT imaging of the head and neck was performed using
the standard protocol during bolus administration of intravenous
contrast. Multiplanar CT image reconstructions and MIPs were
obtained to evaluate the vascular anatomy. Carotid stenosis
measurements (when applicable) are obtained utilizing NASCET
criteria, using the distal internal carotid diameter as the
denominator.
CONTRAST:  80mL [id number]-EZH IOPAMIDOL ([id number]-EZH) INJECTION 76%

[Series 12: ax thin · axial · 0.30mm/px · z∈[+1274,+1341]mm · 2 of 201 slices shown]
[im 67/201  soft-tissue]
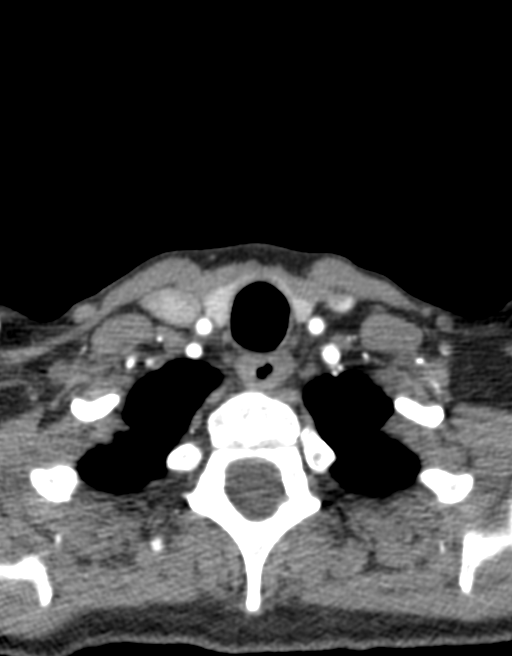
[im 134/201  bone]
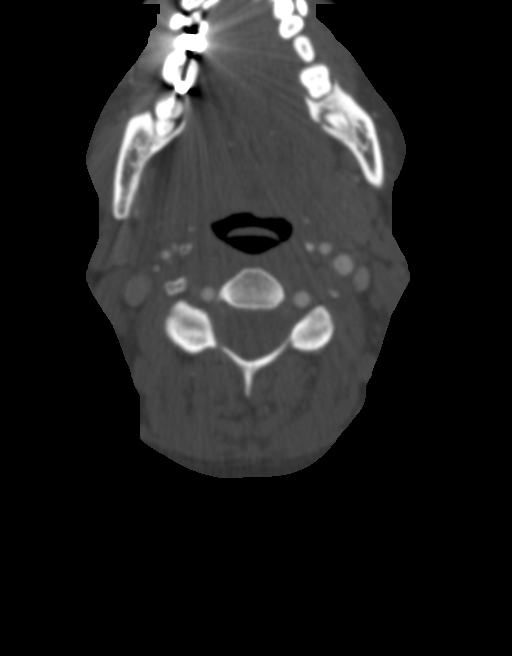

[2 of 33 positions shown; findings below may reference images not displayed]

FINDINGS: CT HEAD FINDINGS

Brain: No mass lesion, intraparenchymal hemorrhage or extra-axial
collection. No evidence of acute cortical infarct. Brain parenchyma
and CSF-containing spaces are normal for age.

Vascular: No hyperdense vessel or unexpected calcification.

Skull: Normal visualized skull base, calvarium and extracranial soft
tissues.

Sinuses/Orbits: No sinus fluid levels or advanced mucosal
thickening. No mastoid effusion. Normal orbits.

CTA NECK FINDINGS

Aortic arch: There is no calcific atherosclerosis of the aortic
arch. There is no aneurysm, dissection or hemodynamically
significant stenosis of the visualized ascending aorta and aortic
arch. Conventional 3 vessel aortic branching pattern. The visualized
proximal subclavian arteries are normal.

Right carotid system: The origin of the right common carotid artery
is widely patent. The right common carotid artery is normal to the
bifurcation. Just distal to the bifurcation, there is severe
narrowing of the right internal carotid artery, which remains
narrowed along the remainder of its course.

Left carotid system: The left common carotid artery origin is widely
patent. Small amount of atherosclerotic calcification within the
midportion of the left common carotid artery. The left internal
carotid artery is completely occluded at its origin.

Vertebral arteries: The vertebral system is codominant. Both
vertebral artery origins are normal. Both vertebral arteries are
normal to their confluence with the basilar artery.

Skeleton: There is no bony spinal canal stenosis. No lytic or
blastic lesions.

Other neck: The nasopharynx is clear. The oropharynx and hypopharynx
are normal. The epiglottis is normal. The supraglottic larynx,
glottis and subglottic larynx are normal. No retropharyngeal
collection. The parapharyngeal spaces are preserved. The parotid and
submandibular glands are normal. No sialolithiasis or salivary
ductal dilatation. The thyroid gland is normal. There is no cervical
lymphadenopathy.

Upper chest: No pneumothorax or pleural effusion. No nodules or
masses.

Review of the MIP images confirms the above findings

CTA HEAD FINDINGS

Anterior circulation:

--Intracranial internal carotid arteries: There is minimal
opacification of the right internal carotid artery at the skullbase,
completely occluded at the clinoid segment. The left internal
carotid artery remains completely occluded.

--Anterior cerebral arteries: Congenitally absent left A1 segment.
The right A1 segment in the anterior communicating artery are
normal. Both A2 segments are normal.

--Middle cerebral arteries: There is collateral filling of both
middle cerebral arteries. Both arteries remain patent with normal
distal branching.

--Posterior communicating arteries: Present bilaterally.

Posterior circulation:

--Posterior cerebral arteries: Normal.

--Superior cerebellar arteries: Normal.

--Basilar artery: Normal.

--Anterior inferior cerebellar arteries: Normal.

--Posterior inferior cerebellar arteries: Normal.

Venous sinuses: As permitted by contrast timing, patent.

Anatomic variants: None

Delayed phase: No parenchymal contrast enhancement.

Review of the MIP images confirms the above findings
IMPRESSION: 1. Severe stenosis along the entire length of the right internal
carotid artery, as noted on prior angiographic studies, with the
intracranial right ICA the coming occluded at the clinoid segment.
Persistent complete occlusion of the left internal carotid artery.
2. There is moya-moya physiology of the intracranial anterior
circulation with the middle and anterior cerebral arteries filling
via collateral pathways.
3. Normal vertebral arteries and posterior circulation.
4. No acute intracranial abnormality.

## 2018-11-22 ENCOUNTER — Other Ambulatory Visit: Payer: Self-pay | Admitting: Obstetrics & Gynecology

## 2018-11-22 ENCOUNTER — Other Ambulatory Visit: Payer: Self-pay | Admitting: Gastroenterology

## 2018-12-06 LAB — TSH: TSH: 0.83 (ref <=5.90)

## 2018-12-06 LAB — HEMOGLOBIN A1C: Hemoglobin A1C: 5.5

## 2018-12-14 ENCOUNTER — Encounter: Payer: Self-pay | Admitting: "Endocrinology

## 2018-12-14 ENCOUNTER — Other Ambulatory Visit: Payer: Self-pay

## 2018-12-14 ENCOUNTER — Other Ambulatory Visit: Payer: Self-pay | Admitting: "Endocrinology

## 2018-12-14 ENCOUNTER — Ambulatory Visit (INDEPENDENT_AMBULATORY_CARE_PROVIDER_SITE_OTHER): Payer: BC Managed Care – PPO | Admitting: "Endocrinology

## 2018-12-14 DIAGNOSIS — E221 Hyperprolactinemia: Secondary | ICD-10-CM | POA: Diagnosis not present

## 2018-12-14 DIAGNOSIS — D352 Benign neoplasm of pituitary gland: Secondary | ICD-10-CM

## 2018-12-14 DIAGNOSIS — E038 Other specified hypothyroidism: Secondary | ICD-10-CM | POA: Diagnosis not present

## 2018-12-14 MED ORDER — CABERGOLINE 0.5 MG PO TABS: 0.2500 mg | ORAL_TABLET | ORAL | 2 refills | Status: DC

## 2018-12-14 MED ORDER — LEVOTHYROXINE SODIUM 50 MCG PO TABS: 50.0000 ug | ORAL_TABLET | Freq: Every day | ORAL | 1 refills | Status: DC

## 2018-12-14 NOTE — Progress Notes (Signed)
12/14/2018                                Endocrinology Telehealth Visit Follow up Note -During COVID -19 Pandemic  I connected with [name] on 12/14/2018   by telephone and verified that I am speaking with the correct person using two identifiers. [name], 07-16-1970. she has verbally consented to this visit. All issues noted in this document were discussed and addressed. The format was not optimal for physical exam.   Subjective:    Patient ID: [name], female    DOB: [birth year], PCP Barry Dienes, NP   Past Medical History:  Diagnosis Date  . Bleeding ulcer   . Bronchitis    hx of  . Carotid artery disease ([place])    bilateral internal carotid artery occlusion by duplex ultrasound  . Cervical cancer (HCC)    cervical  . Chronic headaches   . Common migraine with intractable migraine 02/22/2017  . GERD (gastroesophageal reflux disease)   . Hyperlipidemia   . Hypothyroidism   . Pituitary tumor   . PUD (peptic ulcer disease)    NSAIDS   . Stenosis of right carotid artery   . Stroke ([place])    mini-stroke; memory deficits, slight  . TIA (transient ischemic attack)    Past Surgical History:  Procedure Laterality Date  . ABDOMINAL HYSTERECTOMY    . BIOPSY  02/23/2017   Procedure: BIOPSY;  Surgeon: Danie Binder, MD;  [facility];  Service: Endoscopy;;  duodenal bx's  . BLADDER SUSPENSION    . ESOPHAGOGASTRODUODENOSCOPY  02/2010   Dr. Barney Drain. Multiple duodenal ulcers, gastric erosions (bx negative for H.Pylori)  . ESOPHAGOGASTRODUODENOSCOPY N/A 06/19/2013   SLF: 1. Earlene Plater /dyspepsia due to uncontrolled GERD, & NSAID gastritis/duodenitis.  Marland Kitchen ESOPHAGOGASTRODUODENOSCOPY (EGD) WITH PROPOFOL N/A 02/23/2017   Procedure: ESOPHAGOGASTRODUODENOSCOPY (EGD) WITH PROPOFOL;  Surgeon: Danie Binder, MD;  [facility];  Service: Endoscopy;  Laterality: N/A;  8:15am  . Multiple cyst removal     coccyx, left ring finger, pelvic area & throat/vocal cord and  pilonidal cyst  . RADIOLOGY WITH ANESTHESIA  03/23/2012   Procedure: RADIOLOGY WITH ANESTHESIA;  Surgeon: Rob Hickman, MD;  Location: [place];  Service: Radiology;  Laterality: N/A;  . TOTAL VAGINAL HYSTERECTOMY     Social History   Socioeconomic History  . Marital status: Married    Spouse name: Sherren Mocha  . Number of children: 3  . Years of education: 22  . Highest education level: Not on file  Occupational History  . Occupation: unemployed    Fish farm manager: UNEMPLOYED  Social Needs  . Financial resource strain: Not on file  . Food insecurity    Worry: Not on file    Inability: Not on file  . Transportation needs    Medical: Not on file    Non-medical: Not on file  Tobacco Use  . Smoking status: Current Every Day Smoker    Packs/day: 1.00    Years: 25.00    Pack years: 25.00    Types: Cigarettes  . Smokeless tobacco: Never Used  Substance and Sexual Activity  . Alcohol use: Yes    Alcohol/week: 14.0 - 28.0 standard drinks    Types: 14 - 28 Cans of beer per week    Comment: drinks 2-4 beers a day; occassional  . Drug use: No  . Sexual activity: Yes  Partners: Male    Birth control/protection: Surgical    Comment: spouse  Lifestyle  . Physical activity    Days per week: Not on file    Minutes per session: Not on file  . Stress: Not on file  Relationships  . Social Herbalist on phone: Not on file    Gets together: Not on file    Attends religious service: Not on file    Active member of club or organization: Not on file    Attends meetings of clubs or organizations: Not on file    Relationship status: Not on file  Other Topics Concern  . Not on file  Social History Narrative   Lives with spouse and kids   Caffeine use:    Outpatient Encounter Medications as of 12/14/2018  Medication Sig  . aspirin EC 81 MG tablet Take 81 mg by mouth daily.  Derrill Memo ON 12/15/2018] cabergoline (DOSTINEX) 0.5 MG tablet Take 0.5 tablets (0.25 mg total) by mouth 2 (two)  times a week.  . clopidogrel (PLAVIX) 75 MG tablet Take 1 tablet (75 mg total) by mouth daily.  Marland Kitchen estradiol (ESTRACE) 2 MG tablet TAKE 1 TABLET BY MOUTH ONCE DAILY.  Marland Kitchen Icosapent Ethyl (VASCEPA) 1 G CAPS Take 2 g by mouth 2 (two) times daily.   . lansoprazole (PREVACID) 30 MG capsule TAKE 1 CAPSULE IN THE MORNING (1 HOUR BEFORE BREAKFAST)  . levothyroxine (SYNTHROID) 50 MCG tablet Take 1 tablet (50 mcg total) by mouth daily.  . Multiple Vitamin (MULTIVITAMIN) tablet Take 1 tablet by mouth daily.  . naproxen sodium (ALEVE) 220 MG tablet Take 220 mg by mouth daily as needed (pain).  . ranitidine (ZANTAC) 300 MG tablet TAKE ONE TABLET BY MOUTH AT BEDTIME.  . rosuvastatin (CRESTOR) 20 MG tablet Take 20 mg by mouth daily.  . [DISCONTINUED] levothyroxine (SYNTHROID) 50 MCG tablet TAKE 1 TABLET BY MOUTH ONCE DAILY.   No facility-administered encounter medications on file as of 12/14/2018.    ALLERGIES: Allergies  Allergen Reactions  . Pantoprazole Sodium Itching and Rash  . Penicillins Itching and Rash    Has patient had a PCN reaction causing immediate rash, facial/tongue/throat swelling, SOB or lightheadedness with hypotension: Yes Has patient had a PCN reaction causing severe rash involving mucus membranes or skin necrosis: No Has patient had a PCN reaction that required hospitalization: No Has patient had a PCN reaction occurring within the last 10 years: No If all of the above answers are "NO", then may proceed with Cephalosporin use.   . Sulfa Antibiotics Itching and Rash  . Codeine Nausea And Vomiting  . Demerol [Meperidine]     Red streaks on arm after coadministration of phenergan and demerol at time of EGD in 2012. Tolerated phenergan with subsequent EGD.    VACCINATION STATUS:  There is no immunization history on file for this patient.  HPI 48 year old female with medical history as above.   She is being engaged in telehealth via telephone for follow-up of hypothyroidism,  hyperprolactinemia/pituitary microadenoma, and history of adrenal insufficiency. -She was taken off of cabergoline during her last visit due to low normal prolactin level for more than 2 years.  Her previsit labs show increase of prolactin to 37.6 and she feels that her breasts are getting fuller and slightly tender, no galactorrhea.      She is compliant with her levothyroxine 50 mcg p.o. every morning for hypothyroidism.       She denies nausea, vomiting.  Denies palpitations, tremors, hot flashes.   -She remains off of steroids, she denies any recent hospitalization for adrenal crisis. - She has history of leukocytosis/ thrombocytosis, bilateral carotid artery stenosis complicated by cerebrovascular accident.   Review of Systems  Limited as above. Objective:    There were no vitals taken for this visit.  Wt Readings from Last 3 Encounters:  06/24/18 107 lb 14.4 oz (48.9 kg)  06/13/18 106 lb 12.8 oz (48.4 kg)  12/15/17 103 lb 11.2 oz (47 kg)    Physical Exam CMP     Component Value Date/Time   NA 142 06/17/2018 1109   K 4.6 06/17/2018 1109   CL 107 06/17/2018 1109   CO2 28 06/17/2018 1109   GLUCOSE 87 06/17/2018 1109   BUN 8 06/17/2018 1109   BUN 8 05/18/2017   CREATININE 0.63 06/17/2018 1109   CALCIUM 9.0 06/17/2018 1109   PROT 7.8 06/17/2018 1109   ALBUMIN 4.1 06/17/2018 1109   AST 20 06/17/2018 1109   ALT 14 06/17/2018 1109   ALKPHOS 76 06/17/2018 1109   BILITOT 0.3 06/17/2018 1109   GFRNONAA >60 06/17/2018 1109   GFRAA >60 06/17/2018 1109       Labs from 02/05/2016 showed TSH 1.68, free T4 0.49   Labs from 12/21/2016: TSH 1.7, free T4 0.79 (normal 0.8-1 277), sodium 140, potassium 5.4, leukocytosis at 13, normal lipid panel.  Labs from December 06, 2018: A1c 5.5%, TSH 0.83, prolactin increased 37.6  Assessment & Plan:   1. Hypothyroidism 2.  History of adrenal insufficiency ([place]) 3. Hyperprolactinemia ([place]) 4. Pituitary microadenoma ([place])   - She has  long and complicated medical history including what appears to be steroid induced adrenal insufficiency which required up to 7.5 mg of prednisone daily for at least 6 years, until she decided to stop prednisone in February 2018. She denied any interval hospitalizations or adrenal crisis.    -Her most recent a.m. cortisol is reassuring at 13.2.  She will not require adrenal intervention at this time.  she will need a.m. cortisol at least every year for reevaluation.  - Regarding her history of hyperprolactinemia   -Her previsit labs show increase of her prolactin to 37.6 from 0.3, she will be reconsidered for treatment with cabergoline.  She agrees with plan to start globulin 0.25 mg twice weekly with plan to measure in 4 months.    Her hyperglycemia is likely related to history microadenoma (7 mm pituitary adenoma based on MRI in 2012).  Her 2014 pituitary/sella MRI was significant for 7 mm pituitary microadenoma.  She will not require any surveillance MRI of sella/pituitary this time.  Regarding her hypothyroidism: -Her previsit thyroid function tests are consistent with appropriate replacement with levothyroxine currently at 50 mcg p.o. daily before breakfast.    She is advised to continue.    - We discussed about the correct intake of her thyroid hormone, on empty stomach at fasting, with water, separated by at least 30 minutes from breakfast and other medications,  and separated by more than 4 hours from calcium, iron, multivitamins, acid reflux medications (PPIs). -Patient is made aware of the fact that thyroid hormone replacement is needed for life, dose to be adjusted by periodic monitoring of thyroid function tests.   - Due to her body habitus being light build and propensity for multiple autoimmune dysfunction including premature surgical menopause, she will need a screening bone density test for osteoporosis .  She mentioned that she did have bone density at facility  in [place] and  would like to get her next DEXA there is we are still doing it, if not will be done at [facility].     - I advised patient to maintain close follow up with Barry Dienes, NP for primary care needs.    Time for this visit: 25 minutes. [name]  participated in the discussions, expressed understanding, and voiced agreement with the above plans.  All questions were answered to her satisfaction. she is encouraged to contact clinic should she have any questions or concerns prior to her return visit.  Follow up plan: Return in about 4 months (around 04/15/2019) for Follow up with Pre-visit Labs.  Glade Lloyd, MD Phone: [phone]  Fax: [phone]  This note was partially dictated with voice recognition software. Similar sounding words can be transcribed inadequately or may not  be corrected upon review.  12/14/2018, 11:35 AM

## 2018-12-20 ENCOUNTER — Other Ambulatory Visit: Payer: Self-pay

## 2018-12-20 ENCOUNTER — Emergency Department (HOSPITAL_COMMUNITY): Payer: BC Managed Care – PPO

## 2018-12-20 ENCOUNTER — Inpatient Hospital Stay (HOSPITAL_COMMUNITY)
Admission: EM | Admit: 2018-12-20 | Discharge: 2018-12-27 | DRG: 193 | Disposition: A | Discharge status: Home / Self Care | Payer: BC Managed Care – PPO | Attending: Internal Medicine | Admitting: Internal Medicine

## 2018-12-20 ENCOUNTER — Encounter (HOSPITAL_COMMUNITY): Payer: Self-pay | Admitting: Emergency Medicine

## 2018-12-20 DIAGNOSIS — J440 Chronic obstructive pulmonary disease with acute lower respiratory infection: Secondary | ICD-10-CM | POA: Diagnosis present

## 2018-12-20 DIAGNOSIS — I639 Cerebral infarction, unspecified: Secondary | ICD-10-CM | POA: Diagnosis present

## 2018-12-20 DIAGNOSIS — E872 Acidosis: Secondary | ICD-10-CM | POA: Diagnosis not present

## 2018-12-20 DIAGNOSIS — F17210 Nicotine dependence, cigarettes, uncomplicated: Secondary | ICD-10-CM | POA: Diagnosis present

## 2018-12-20 DIAGNOSIS — Z7982 Long term (current) use of aspirin: Secondary | ICD-10-CM

## 2018-12-20 DIAGNOSIS — Z79899 Other long term (current) drug therapy: Secondary | ICD-10-CM

## 2018-12-20 DIAGNOSIS — Z8049 Family history of malignant neoplasm of other genital organs: Secondary | ICD-10-CM

## 2018-12-20 DIAGNOSIS — J9811 Atelectasis: Secondary | ICD-10-CM | POA: Diagnosis present

## 2018-12-20 DIAGNOSIS — E782 Mixed hyperlipidemia: Secondary | ICD-10-CM | POA: Diagnosis not present

## 2018-12-20 DIAGNOSIS — J189 Pneumonia, unspecified organism: Principal | ICD-10-CM | POA: Diagnosis present

## 2018-12-20 DIAGNOSIS — J181 Lobar pneumonia, unspecified organism: Secondary | ICD-10-CM | POA: Diagnosis not present

## 2018-12-20 DIAGNOSIS — E785 Hyperlipidemia, unspecified: Secondary | ICD-10-CM | POA: Diagnosis present

## 2018-12-20 DIAGNOSIS — Z79890 Hormone replacement therapy (postmenopausal): Secondary | ICD-10-CM

## 2018-12-20 DIAGNOSIS — Z791 Long term (current) use of non-steroidal anti-inflammatories (NSAID): Secondary | ICD-10-CM

## 2018-12-20 DIAGNOSIS — Z882 Allergy status to sulfonamides status: Secondary | ICD-10-CM

## 2018-12-20 DIAGNOSIS — G4700 Insomnia, unspecified: Secondary | ICD-10-CM | POA: Diagnosis not present

## 2018-12-20 DIAGNOSIS — I6523 Occlusion and stenosis of bilateral carotid arteries: Secondary | ICD-10-CM | POA: Diagnosis not present

## 2018-12-20 DIAGNOSIS — J9601 Acute respiratory failure with hypoxia: Secondary | ICD-10-CM | POA: Diagnosis present

## 2018-12-20 DIAGNOSIS — Z833 Family history of diabetes mellitus: Secondary | ICD-10-CM

## 2018-12-20 DIAGNOSIS — E876 Hypokalemia: Secondary | ICD-10-CM | POA: Diagnosis present

## 2018-12-20 DIAGNOSIS — F17200 Nicotine dependence, unspecified, uncomplicated: Secondary | ICD-10-CM | POA: Diagnosis present

## 2018-12-20 DIAGNOSIS — F419 Anxiety disorder, unspecified: Secondary | ICD-10-CM | POA: Diagnosis present

## 2018-12-20 DIAGNOSIS — Z20828 Contact with and (suspected) exposure to other viral communicable diseases: Secondary | ICD-10-CM | POA: Diagnosis present

## 2018-12-20 DIAGNOSIS — Z888 Allergy status to other drugs, medicaments and biological substances status: Secondary | ICD-10-CM

## 2018-12-20 DIAGNOSIS — Z885 Allergy status to narcotic agent status: Secondary | ICD-10-CM

## 2018-12-20 DIAGNOSIS — Z825 Family history of asthma and other chronic lower respiratory diseases: Secondary | ICD-10-CM

## 2018-12-20 DIAGNOSIS — I69311 Memory deficit following cerebral infarction: Secondary | ICD-10-CM

## 2018-12-20 DIAGNOSIS — Z880 Allergy status to penicillin: Secondary | ICD-10-CM

## 2018-12-20 DIAGNOSIS — M791 Myalgia, unspecified site: Secondary | ICD-10-CM | POA: Diagnosis not present

## 2018-12-20 DIAGNOSIS — Z90710 Acquired absence of both cervix and uterus: Secondary | ICD-10-CM

## 2018-12-20 DIAGNOSIS — K219 Gastro-esophageal reflux disease without esophagitis: Secondary | ICD-10-CM | POA: Diagnosis not present

## 2018-12-20 DIAGNOSIS — Z808 Family history of malignant neoplasm of other organs or systems: Secondary | ICD-10-CM

## 2018-12-20 DIAGNOSIS — Z8541 Personal history of malignant neoplasm of cervix uteri: Secondary | ICD-10-CM

## 2018-12-20 DIAGNOSIS — I779 Disorder of arteries and arterioles, unspecified: Secondary | ICD-10-CM | POA: Diagnosis present

## 2018-12-20 DIAGNOSIS — Z8711 Personal history of peptic ulcer disease: Secondary | ICD-10-CM

## 2018-12-20 DIAGNOSIS — Z8249 Family history of ischemic heart disease and other diseases of the circulatory system: Secondary | ICD-10-CM

## 2018-12-20 DIAGNOSIS — Z7902 Long term (current) use of antithrombotics/antiplatelets: Secondary | ICD-10-CM

## 2018-12-20 DIAGNOSIS — Z720 Tobacco use: Secondary | ICD-10-CM | POA: Diagnosis present

## 2018-12-20 DIAGNOSIS — E039 Hypothyroidism, unspecified: Secondary | ICD-10-CM | POA: Diagnosis present

## 2018-12-20 LAB — COMPREHENSIVE METABOLIC PANEL
ALT: 10 U/L (ref 0–44)
AST: 14 U/L — ABNORMAL LOW (ref 15–41)
Albumin: 3 g/dL — ABNORMAL LOW (ref 3.5–5.0)
Alkaline Phosphatase: 92 U/L (ref 38–126)
Anion gap: 11 (ref 5–15)
BUN: <5 mg/dL — ABNORMAL LOW (ref 6–20)
CO2: 27 mmol/L (ref 22–32)
Calcium: 8.5 mg/dL — ABNORMAL LOW (ref 8.9–10.3)
Chloride: 98 mmol/L (ref 98–111)
Creatinine, Ser: 0.53 mg/dL (ref 0.44–1.00)
GFR calc Af Amer: >60 mL/min (ref >60)
GFR calc non Af Amer: >60 mL/min (ref >60)
Glucose, Bld: 112 mg/dL — ABNORMAL HIGH (ref 70–99)
Potassium: 3.9 mmol/L (ref 3.5–5.1)
Sodium: 136 mmol/L (ref 135–145)
Total Bilirubin: 0.6 mg/dL (ref 0.3–1.2)
Total Protein: 7.5 g/dL (ref 6.5–8.1)

## 2018-12-20 LAB — CBC WITH DIFFERENTIAL/PLATELET
Abs Immature Granulocytes: 0.09 10*3/uL — ABNORMAL HIGH (ref 0.00–0.07)
Basophils Absolute: 0.1 10*3/uL (ref 0.0–0.1)
Basophils Relative: 0 %
Eosinophils Absolute: 0 10*3/uL (ref 0.0–0.5)
Eosinophils Relative: 0 %
HCT: 34.4 % — ABNORMAL LOW (ref 36.0–46.0)
Hemoglobin: 10.8 g/dL — ABNORMAL LOW (ref 12.0–15.0)
Immature Granulocytes: 0 %
Lymphocytes Relative: 9 %
Lymphs Abs: 1.8 10*3/uL (ref 0.7–4.0)
MCH: 29.8 pg (ref 26.0–34.0)
MCHC: 31.4 g/dL (ref 30.0–36.0)
MCV: 94.8 fL (ref 80.0–100.0)
Monocytes Absolute: 0.7 10*3/uL (ref 0.1–1.0)
Monocytes Relative: 3 %
Neutro Abs: 17.9 10*3/uL — ABNORMAL HIGH (ref 1.7–7.7)
Neutrophils Relative %: 88 %
Platelets: 663 10*3/uL — ABNORMAL HIGH (ref 150–400)
RBC: 3.63 MIL/uL — ABNORMAL LOW (ref 3.87–5.11)
RDW: 12 % (ref 11.5–15.5)
WBC: 20.5 10*3/uL — ABNORMAL HIGH (ref 4.0–10.5)
nRBC: 0.1 % (ref 0.0–0.2)

## 2018-12-20 LAB — SARS CORONAVIRUS 2 BY RT PCR (HOSPITAL ORDER, PERFORMED IN ~~LOC~~ HOSPITAL LAB): SARS Coronavirus 2: NEGATIVE

## 2018-12-20 LAB — MAGNESIUM: Magnesium: 1.4 mg/dL — ABNORMAL LOW (ref 1.7–2.4)

## 2018-12-20 MED ORDER — ACETAMINOPHEN 325 MG PO TABS
650.0000 mg | ORAL_TABLET | Freq: Four times a day (QID) | ORAL | Status: DC | PRN
  Administered 2018-12-26 – 2018-12-27 (×2): 650 mg via ORAL
  Filled 2018-12-20 (×3): qty 2

## 2018-12-20 MED ORDER — ENOXAPARIN SODIUM 40 MG/0.4ML ~~LOC~~ SOLN
40.0000 mg | SUBCUTANEOUS | Status: DC
  Administered 2018-12-20 – 2018-12-26 (×7): 40 mg via SUBCUTANEOUS
  Filled 2018-12-20 (×7): qty 0.4

## 2018-12-20 MED ORDER — LEVOTHYROXINE SODIUM 50 MCG PO TABS
50.0000 ug | ORAL_TABLET | Freq: Every day | ORAL | Status: DC
  Administered 2018-12-21 – 2018-12-27 (×7): 50 ug via ORAL
  Filled 2018-12-20 (×8): qty 1

## 2018-12-20 MED ORDER — HYDROMORPHONE HCL 1 MG/ML IJ SOLN
INTRAMUSCULAR | Status: AC
  Administered 2018-12-20: 11:00:00 1 mg via INTRAVENOUS
  Filled 2018-12-20: qty 1

## 2018-12-20 MED ORDER — HYDROMORPHONE HCL 1 MG/ML IJ SOLN
1.0000 mg | Freq: Once | INTRAMUSCULAR | Status: AC
  Administered 2018-12-20: 1 mg via INTRAVENOUS
  Filled 2018-12-20: qty 1

## 2018-12-20 MED ORDER — SODIUM CHLORIDE 0.9 % IV SOLN
INTRAVENOUS | Status: AC
  Administered 2018-12-20: 15:00:00 via INTRAVENOUS

## 2018-12-20 MED ORDER — KETOROLAC TROMETHAMINE 30 MG/ML IJ SOLN
30.0000 mg | Freq: Four times a day (QID) | INTRAMUSCULAR | Status: AC | PRN
  Administered 2018-12-20: 19:00:00 30 mg via INTRAVENOUS
  Filled 2018-12-20 (×3): qty 1

## 2018-12-20 MED ORDER — ENSURE ENLIVE PO LIQD
237.0000 mL | Freq: Two times a day (BID) | ORAL | Status: DC
  Administered 2018-12-22 – 2018-12-26 (×8): 237 mL via ORAL

## 2018-12-20 MED ORDER — OMEGA-3-ACID ETHYL ESTERS 1 G PO CAPS
2.0000 g | ORAL_CAPSULE | Freq: Two times a day (BID) | ORAL | Status: DC
  Administered 2018-12-20: 23:00:00 2 g via ORAL
  Filled 2018-12-20 (×2): qty 2

## 2018-12-20 MED ORDER — ALUM & MAG HYDROXIDE-SIMETH 200-200-20 MG/5ML PO SUSP
30.0000 mL | ORAL | Status: DC | PRN
  Administered 2018-12-25: 30 mL via ORAL
  Filled 2018-12-20 (×3): qty 30

## 2018-12-20 MED ORDER — FAMOTIDINE 20 MG PO TABS
20.0000 mg | ORAL_TABLET | Freq: Two times a day (BID) | ORAL | Status: DC
  Administered 2018-12-20 – 2018-12-27 (×14): 20 mg via ORAL
  Filled 2018-12-20 (×14): qty 1

## 2018-12-20 MED ORDER — ONDANSETRON HCL 4 MG/2ML IJ SOLN
4.0000 mg | Freq: Once | INTRAMUSCULAR | Status: AC
  Administered 2018-12-20: 4 mg via INTRAVENOUS
  Filled 2018-12-20: qty 2

## 2018-12-20 MED ORDER — ICOSAPENT ETHYL 1 G PO CAPS: 2.0000 g | ORAL_CAPSULE | Freq: Two times a day (BID) | ORAL | Status: DC

## 2018-12-20 MED ORDER — IPRATROPIUM-ALBUTEROL 0.5-2.5 (3) MG/3ML IN SOLN: 3.0000 mL | Freq: Four times a day (QID) | RESPIRATORY_TRACT | Status: DC | PRN

## 2018-12-20 MED ORDER — CLOPIDOGREL BISULFATE 75 MG PO TABS
75.0000 mg | ORAL_TABLET | Freq: Every day | ORAL | Status: DC
  Administered 2018-12-21 – 2018-12-27 (×7): 75 mg via ORAL
  Filled 2018-12-20 (×9): qty 1

## 2018-12-20 MED ORDER — LEVOFLOXACIN IN D5W 500 MG/100ML IV SOLN: 500.0000 mg | Freq: Once | INTRAVENOUS | Status: DC

## 2018-12-20 MED ORDER — HYDROMORPHONE HCL 1 MG/ML IJ SOLN
1.0000 mg | INTRAMUSCULAR | Status: DC | PRN
  Administered 2018-12-20: 1 mg via INTRAVENOUS
  Filled 2018-12-20 (×3): qty 1

## 2018-12-20 MED ORDER — ASPIRIN EC 81 MG PO TBEC
81.0000 mg | DELAYED_RELEASE_TABLET | Freq: Every day | ORAL | Status: DC
  Administered 2018-12-21 – 2018-12-27 (×7): 81 mg via ORAL
  Filled 2018-12-20 (×8): qty 1

## 2018-12-20 MED ORDER — HYDROMORPHONE HCL 1 MG/ML IJ SOLN
1.0000 mg | Freq: Once | INTRAMUSCULAR | Status: AC
  Administered 2018-12-20: 11:00:00 1 mg via INTRAVENOUS
  Filled 2018-12-20: qty 1

## 2018-12-20 MED ORDER — LEVOFLOXACIN IN D5W 750 MG/150ML IV SOLN
750.0000 mg | INTRAVENOUS | Status: DC
  Administered 2018-12-20: 750 mg via INTRAVENOUS
  Filled 2018-12-20: qty 150

## 2018-12-20 MED ORDER — ADULT MULTIVITAMIN W/MINERALS CH
1.0000 | ORAL_TABLET | Freq: Every day | ORAL | Status: DC
  Administered 2018-12-21 – 2018-12-27 (×7): 1 via ORAL
  Filled 2018-12-20 (×8): qty 1

## 2018-12-20 MED ORDER — ROSUVASTATIN CALCIUM 20 MG PO TABS: 20.0000 mg | ORAL_TABLET | Freq: Every day | ORAL | Status: DC

## 2018-12-20 MED ORDER — ESTRADIOL 1 MG PO TABS
2.0000 mg | ORAL_TABLET | Freq: Every day | ORAL | Status: DC
  Administered 2018-12-20 – 2018-12-26 (×7): 2 mg via ORAL
  Filled 2018-12-20 (×7): qty 2
  Filled 2018-12-20: qty 1

## 2018-12-20 NOTE — ED Provider Notes (Signed)
[facility] EMERGENCY DEPARTMENT Provider Note   CSN: [id number] Arrival date & time: 12/20/18  0941     History   Chief Complaint Chief Complaint  Patient presents with  . Muscle Pain    HPI [name] is a 48 y.o. female.     Patient complains of chest discomfort on the right side.  For couple days.  The history is provided by the patient. No language interpreter was used.  Chest Pain Pain location:  Substernal area and R chest Pain quality: aching   Pain radiates to:  Does not radiate Pain severity:  Moderate Onset quality:  Sudden Timing:  Constant Progression:  Worsening Chronicity:  New Context: not breathing   Relieved by:  Nothing Worsened by:  Nothing Ineffective treatments:  None tried Associated symptoms: no abdominal pain, no back pain, no cough, no fatigue and no headache     Past Medical History:  Diagnosis Date  . Bleeding ulcer   . Bronchitis    hx of  . Carotid artery disease ([place])    bilateral internal carotid artery occlusion by duplex ultrasound  . Cervical cancer (HCC)    cervical  . Chronic headaches   . Common migraine with intractable migraine 02/22/2017  . GERD (gastroesophageal reflux disease)   . Hyperlipidemia   . Hypothyroidism   . Pituitary tumor   . PUD (peptic ulcer disease)    NSAIDS   . Stenosis of right carotid artery   . Stroke ([place])    mini-stroke; memory deficits, slight  . TIA (transient ischemic attack)     Patient Active Problem List   Diagnosis Date Noted  . Leukocytosis 06/24/2018  . Lung nodules 06/24/2018  . Headache 07/15/2017  . TIA (transient ischemic attack) 07/15/2017  . Bloating   . Early satiety   . Common migraine with intractable migraine 02/22/2017  . GERD (gastroesophageal reflux disease) 01/18/2017  . Hyperthyroidism 06/15/2016  . Other specified hypothyroidism 06/15/2016  . Adrenal insufficiency ([place]) 06/15/2016  . Hyperprolactinemia ([place]) 06/15/2016  . Pituitary microadenoma ([place])  06/15/2016  . Dyspepsia 05/31/2013  . Hyperlipidemia 09/15/2012  . Carotid artery disease ([place])   . PUD (peptic ulcer disease) 07/16/2010    Past Surgical History:  Procedure Laterality Date  . ABDOMINAL HYSTERECTOMY    . BIOPSY  02/23/2017   Procedure: BIOPSY;  Surgeon: Danie Binder, MD;  [facility];  Service: Endoscopy;;  duodenal bx's  . BLADDER SUSPENSION    . ESOPHAGOGASTRODUODENOSCOPY  02/2010   Dr. Barney Drain. Multiple duodenal ulcers, gastric erosions (bx negative for H.Pylori)  . ESOPHAGOGASTRODUODENOSCOPY N/A 06/19/2013   SLF: 1. Earlene Plater /dyspepsia due to uncontrolled GERD, & NSAID gastritis/duodenitis.  Marland Kitchen ESOPHAGOGASTRODUODENOSCOPY (EGD) WITH PROPOFOL N/A 02/23/2017   Procedure: ESOPHAGOGASTRODUODENOSCOPY (EGD) WITH PROPOFOL;  Surgeon: Danie Binder, MD;  [facility];  Service: Endoscopy;  Laterality: N/A;  8:15am  . Multiple cyst removal     coccyx, left ring finger, pelvic area & throat/vocal cord and pilonidal cyst  . RADIOLOGY WITH ANESTHESIA  03/23/2012   Procedure: RADIOLOGY WITH ANESTHESIA;  Surgeon: Rob Hickman, MD;  Location: [place];  Service: Radiology;  Laterality: N/A;  . TOTAL VAGINAL HYSTERECTOMY       OB History   No obstetric history on file.      Home Medications    Prior to Admission medications   Medication Sig Start Date End Date Taking? Authorizing Provider  aspirin EC 81 MG tablet Take 81 mg by  mouth daily.   Yes [provider]  cabergoline (DOSTINEX) 0.5 MG tablet Take 0.5 tablets (0.25 mg total) by mouth 2 (two) times a week. 12/15/18  Yes Cassandria Anger, MD  clopidogrel (PLAVIX) 75 MG tablet Take 1 tablet (75 mg total) by mouth daily. 06/19/13  Yes Fields, Marga Melnick, MD  estradiol (ESTRACE) 2 MG tablet TAKE 1 TABLET BY MOUTH ONCE DAILY. 11/22/18  Yes Florian Buff, MD  Icosapent Ethyl (VASCEPA) 1 G CAPS Take 2 g by mouth 2 (two) times daily.    Yes [provider]  lansoprazole  (PREVACID) 30 MG capsule TAKE 1 CAPSULE IN THE MORNING (1 HOUR BEFORE BREAKFAST) 11/23/18  Yes Annitta Needs, NP  levothyroxine (SYNTHROID) 50 MCG tablet Take 1 tablet (50 mcg total) by mouth daily. 12/14/18  Yes Cassandria Anger, MD  Multiple Vitamin (MULTIVITAMIN) tablet Take 1 tablet by mouth daily.   Yes [provider]  rosuvastatin (CRESTOR) 20 MG tablet Take 20 mg by mouth daily.   Yes [provider]  naproxen sodium (ALEVE) 220 MG tablet Take 220 mg by mouth daily as needed (pain).    [provider]  ranitidine (ZANTAC) 300 MG tablet TAKE ONE TABLET BY MOUTH AT BEDTIME. Patient not taking: Reported on 12/20/2018 06/29/18   Annitta Needs, NP    Family History Family History  Problem Relation Age of Onset  . Diabetes Mother   . COPD Mother   . Heart disease Father   . Cervical cancer Sister   . Colon cancer Neg Hx   . Colon polyps Neg Hx     Social History Social History   Tobacco Use  . Smoking status: Current Every Day Smoker    Packs/day: 1.00    Years: 25.00    Pack years: 25.00    Types: Cigarettes  . Smokeless tobacco: Never Used  Substance Use Topics  . Alcohol use: Yes    Comment: once a week  . Drug use: No     Allergies   Pantoprazole sodium, Penicillins, Sulfa antibiotics, Codeine, and Demerol [meperidine]   Review of Systems Review of Systems  Constitutional: Negative for appetite change and fatigue.  HENT: Negative for congestion, ear discharge and sinus pressure.   Eyes: Negative for discharge.  Respiratory: Negative for cough.   Cardiovascular: Positive for chest pain.  Gastrointestinal: Negative for abdominal pain and diarrhea.  Genitourinary: Negative for frequency and hematuria.  Musculoskeletal: Negative for back pain.  Skin: Negative for rash.  Neurological: Negative for seizures and headaches.  Psychiatric/Behavioral: Negative for hallucinations.     Physical Exam Updated Vital Signs BP 113/75   Pulse  99   Temp (!) 100.6 F (38.1 C) (Oral)   Resp 19   Ht 5\' 2"  (1.575 m)   Wt 49.9 kg   SpO2 92%   BMI 20.12 kg/m   Physical Exam Vitals signs and nursing note reviewed.  Constitutional:      Appearance: She is well-developed.  HENT:     Head: Normocephalic.     Nose: Nose normal.  Eyes:     General: No scleral icterus.    Conjunctiva/sclera: Conjunctivae normal.  Neck:     Musculoskeletal: Neck supple.     Thyroid: No thyromegaly.  Cardiovascular:     Rate and Rhythm: Normal rate and regular rhythm.     Heart sounds: No murmur. No friction rub. No gallop.   Pulmonary:     Breath sounds: No stridor. No wheezing  or rales.  Chest:     Chest wall: No tenderness.  Abdominal:     General: There is no distension.     Tenderness: There is no abdominal tenderness. There is no rebound.  Musculoskeletal: Normal range of motion.  Lymphadenopathy:     Cervical: No cervical adenopathy.  Skin:    Findings: No erythema or rash.  Neurological:     Mental Status: She is oriented to person, place, and time.     Motor: No abnormal muscle tone.     Coordination: Coordination normal.  Psychiatric:        Behavior: Behavior normal.      ED Treatments / Results  Labs (all labs ordered are listed, but only abnormal results are displayed) Labs Reviewed  CBC WITH DIFFERENTIAL/PLATELET - Abnormal; Notable for the following components:      Result Value   WBC 20.5 (*)    RBC 3.63 (*)    Hemoglobin 10.8 (*)    HCT 34.4 (*)    Platelets 663 (*)    Neutro Abs 17.9 (*)    Abs Immature Granulocytes 0.09 (*)    All other components within normal limits  COMPREHENSIVE METABOLIC PANEL - Abnormal; Notable for the following components:   Glucose, Bld 112 (*)    BUN <5 (*)    Calcium 8.5 (*)    Albumin 3.0 (*)    AST 14 (*)    All other components within normal limits  SARS CORONAVIRUS 2 (HOSPITAL ORDER, [place] [facility])  CULTURE, BLOOD (ROUTINE X 2)  CULTURE, BLOOD  (ROUTINE X 2)    EKG None  Radiology Dg Ribs Unilateral W/chest Right  Result Date: 12/20/2018 CLINICAL DATA:  Chest pain EXAM: RIGHT RIBS AND CHEST - 3+ VIEW COMPARISON:  Chest radiograph March 22, 2012 FINDINGS: Frontal chest as well as oblique and cone-down rib images obtained. There is extensive consolidation in the right lower lung region. Lungs elsewhere are clear. Heart size and pulmonary vascularity are normal. No adenopathy. No rib fracture is evident. No destructive bone lesions. No pneumothorax or pleural effusion. IMPRESSION: Airspace opacity in the right lower lung region. Suspect pneumonia. An underlying mass is possible. Lungs elsewhere are clear. No bone lesion. No fracture. Followup PA and lateral radiographs recommended in 3-4 weeks following trial of antibiotic therapy to ensure resolution and exclude underlying malignancy. Electronically Signed   By: Lowella Grip III M.D.   On: 12/20/2018 11:06   Ct Chest Wo Contrast  Result Date: 12/20/2018 CLINICAL DATA:  Abnormal chest x-ray with cough for several days EXAM: CT CHEST WITHOUT CONTRAST TECHNIQUE: Multidetector CT imaging of the chest was performed following the standard protocol without IV contrast. COMPARISON:  Rib series from earlier in the same day. FINDINGS: Cardiovascular: Somewhat limited due to lack of IV contrast. Atherosclerotic calcifications are noted although no aneurysmal dilatation is seen. No cardiac enlargement is seen. No pericardial effusion is noted. Mediastinum/Nodes: The esophagus is within normal limits. They were are mediastinal nodes identified. A 10 mm short axis node is noted in the precarinal region with a 16 mm short axis node in the subcarinal region. Lack of IV contrast limits evaluation of the hilum somewhat although a 17 mm short axis node is noted in the right infrahilar region best seen on image number 80 of series 2. There are likely more right hilar nodes present although evaluation is  somewhat difficult. Lungs/Pleura: The lungs are well aerated bilaterally. Bridging the minor fissure on  the right there is a prominent soft tissue mass which measures approximately 5.2 x 3.9 cm in greatest dimension. Focal area of cavitation is suggested best seen on image number 69 of series 4. Some surrounding prominence of the interlobular septa is noted as well as some wedge-shaped infiltrate with occluded bronchi. Small right-sided pleural effusion is noted as well. The left lung is clear. Upper Abdomen: Visualized upper abdomen demonstrates the adrenal glands to be within normal limits. No other abnormality in the upper abdomen is seen. Musculoskeletal: Bony structures show mild degenerative change of the thoracic spine. No bony destruction is seen. IMPRESSION: Masslike density in the right lung bridging the minor fissure with some associated peripheral inflammatory changes. Associated small effusion is present. Mediastinal and hilar adenopathy likely reactive in nature is noted. These changes likely represent aggressive pneumonia given the short history and lack of associated ancillary findings to suggest neoplasm. Appropriate therapy is recommended and a follow-up contrast enhanced CT in 3-4 weeks is recommended to assess for resolution. Aortic Atherosclerosis (ICD10-I70.0). Electronically Signed   By: Inez Catalina M.D.   On: 12/20/2018 12:10    Procedures Procedures (including critical care time)  Medications Ordered in ED Medications  levofloxacin (LEVAQUIN) IVPB 500 mg (has no administration in time range)  HYDROmorphone (DILAUDID) injection 1 mg (1 mg Intravenous Given 12/20/18 1049)  ondansetron (ZOFRAN) injection 4 mg (4 mg Intravenous Given 12/20/18 1039)  HYDROmorphone (DILAUDID) injection 1 mg (1 mg Intravenous Given 12/20/18 1246)  ondansetron (ZOFRAN) injection 4 mg (4 mg Intravenous Given 12/20/18 1246)     Initial Impression / Assessment and Plan / ED Course  I have reviewed the triage  vital signs and the nursing notes.  Pertinent labs & imaging results that were available during my care of the patient were reviewed by me and considered in my medical decision making (see chart for details).   CT scan shows pneumonia.  Patient also has leukocytosis.  She is hemodynamically stable and will be admitted to medicine for pneumonia      Final Clinical Impressions(s) / ED Diagnoses   Final diagnoses:  Community acquired pneumonia of right middle lobe of lung [facility])    ED Discharge Orders    None       Milton Ferguson, MD 12/20/18 1344

## 2018-12-20 NOTE — ED Triage Notes (Signed)
Pt c/o of "pulled muscle" on her right side and right chest starting at 600 am this am.   States " Ive been coughing a lot"

## 2018-12-20 NOTE — ED Notes (Signed)
ED Provider at bedside. 

## 2018-12-20 NOTE — ED Notes (Signed)
Pt crying, upset saying "I cant breathe"  I informed pt that her oxygen was hooked up, told pt to breath slowly and try to calm down.  Pt's oxygen came up to 94%.

## 2018-12-20 NOTE — H&P (Signed)
History and Physical    [name] [id number] DOB: [birth year] DOA: 12/20/2018  Referring MD/NP/PA: Dr. Roderic Palau PCP: Sander Radon, NP  Patient coming from: Home.  Chief Complaint: Right-sided chest pain, cough and shortness of breath.  HPI: [name] is a 48 y.o. female with past medical history significant for gastroesophageal reflux disease, hyperlipidemia, tobacco abuse, hypothyroidism, carotid artery disease and previous stroke (without significant residual deficit); who presented to the emergency department secondary to right-sided chest discomfort, shortness of breath and coughing spells.  Patient reports significant nonproductive cough over the last 3 days and on the day of admission reports difficulty catching her breath and feeling short of breath.  Patient denies any specific chest pain, no nausea, no vomiting, no headaches, no blurred vision, no focal weakness, no dysuria, no hematuria, no melena, no sick contacts or any other complaints. In the ED chest x-ray and CT chest demonstrated right lower lobe pneumonia; patient with WBCs in the 20,000 range) chronically with leukocytosis in the 12-16 K range), patient found to be febrile and experiencing tachypnea with significant right-sided chest discomfort.  Cultures were taken, IV fluids initiated and IV antibiotics given.  [facility] has been called on the patient for further evaluation and management.  Past Medical/Surgical History: Past Medical History:  Diagnosis Date  . Bleeding ulcer   . Bronchitis    hx of  . 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 Cervical cancer ([facility])    cervical  . Chronic headaches   . Common migraine with intractable migraine 02/22/2017  . GERD (gastroesophageal reflux disease)   . Hyperlipidemia   . Hypothyroidism   . Pituitary tumor   . PUD (peptic ulcer disease)    NSAIDS   . Stenosis of right carotid artery   . Stroke ([place])    mini-stroke; memory deficits, slight  . TIA (transient ischemic attack)     Past Surgical History:  Procedure Laterality Date  . ABDOMINAL HYSTERECTOMY    . BIOPSY  02/23/2017   Procedure: BIOPSY;  Surgeon: Danie Binder, MD;  [facility];  Service: Endoscopy;;  duodenal bx's  . BLADDER SUSPENSION    . ESOPHAGOGASTRODUODENOSCOPY  02/2010   Dr. Barney Drain. Multiple duodenal ulcers, gastric erosions (bx negative for H.Pylori)  . ESOPHAGOGASTRODUODENOSCOPY N/A 06/19/2013   SLF: 1. Earlene Plater /dyspepsia due to uncontrolled GERD, & NSAID gastritis/duodenitis.  Marland Kitchen ESOPHAGOGASTRODUODENOSCOPY (EGD) WITH PROPOFOL N/A 02/23/2017   Procedure: ESOPHAGOGASTRODUODENOSCOPY (EGD) WITH PROPOFOL;  Surgeon: Danie Binder, MD;  Location: [facility];  Service: Endoscopy;  Laterality: N/A;  8:15am  . Multiple cyst removal     coccyx, left ring finger, pelvic area & throat/vocal cord and pilonidal cyst  . RADIOLOGY WITH ANESTHESIA  03/23/2012   Procedure: RADIOLOGY WITH ANESTHESIA;  Surgeon: Rob Hickman, MD;  Location: [facility];  Service: Radiology;  Laterality: N/A;  . TOTAL VAGINAL HYSTERECTOMY      Social History:  reports that she has been smoking cigarettes. She has a 25.00 pack-year smoking history. She has never used smokeless tobacco. She reports current alcohol use. She reports that she does not use drugs.  Allergies: Allergies  Allergen Reactions  . Pantoprazole Sodium Itching and Rash  . Penicillins Itching and Rash    Has patient had a PCN reaction causing immediate rash, facial/tongue/throat swelling, SOB or lightheadedness with hypotension: Yes Has patient had a PCN reaction causing severe rash involving mucus membranes or  skin necrosis: No Has patient had a PCN reaction that required hospitalization: No Has patient had a PCN reaction occurring within the last 10 years: No If all of the above answers are "NO", then may proceed with Cephalosporin use.   . Sulfa  Antibiotics Itching and Rash  . Codeine Nausea And Vomiting  . Demerol [Meperidine]     Red streaks on arm after coadministration of phenergan and demerol at time of EGD in 2012. Tolerated phenergan with subsequent EGD.    Family History:  Family History  Problem Relation Age of Onset  . Diabetes Mother   . COPD Mother   . Heart disease Father   . Cervical cancer Sister   . Colon cancer Neg Hx   . Colon polyps Neg Hx     Prior to Admission medications   Medication Sig Start Date End Date Taking? Authorizing Provider  aspirin EC 81 MG tablet Take 81 mg by mouth daily.   Yes [provider]  cabergoline (DOSTINEX) 0.5 MG tablet Take 0.5 tablets (0.25 mg total) by mouth 2 (two) times a week. 12/15/18  Yes Cassandria Anger, MD  clopidogrel (PLAVIX) 75 MG tablet Take 1 tablet (75 mg total) by mouth daily. 06/19/13  Yes Fields, Marga Melnick, MD  estradiol (ESTRACE) 2 MG tablet TAKE 1 TABLET BY MOUTH ONCE DAILY. 11/22/18  Yes Florian Buff, MD  Icosapent Ethyl (VASCEPA) 1 G CAPS Take 2 g by mouth 2 (two) times daily.    Yes [provider]  lansoprazole (PREVACID) 30 MG capsule TAKE 1 CAPSULE IN THE MORNING (1 HOUR BEFORE BREAKFAST) 11/23/18  Yes Annitta Needs, NP  levothyroxine (SYNTHROID) 50 MCG tablet Take 1 tablet (50 mcg total) by mouth daily. 12/14/18  Yes Cassandria Anger, MD  Multiple Vitamin (MULTIVITAMIN) tablet Take 1 tablet by mouth daily.   Yes [provider]  rosuvastatin (CRESTOR) 20 MG tablet Take 20 mg by mouth daily.   Yes [provider]  naproxen sodium (ALEVE) 220 MG tablet Take 220 mg by mouth daily as needed (pain).    [provider]  ranitidine (ZANTAC) 300 MG tablet TAKE ONE TABLET BY MOUTH AT BEDTIME. Patient not taking: Reported on 12/20/2018 06/29/18   Annitta Needs, NP    Review of Systems:  Negative except as otherwise mentioned in HPI.  Physical Exam: Vitals:   12/20/18 1230 12/20/18 1232 12/20/18 1245 12/20/18  1300  BP: (!) 119/100   113/75  Pulse:  (!) 118 (!) 114 99  Resp: (!) 21 (!) 29 (!) 35 19  Temp:      TempSrc:      SpO2:  93% 90% 92%  Weight:      Height:        Constitutional: Warm to touch, mild distress secondary to right-sided chest discomfort and difficulty taking deep breath.  No nausea, no vomiting. Eyes: PERRL, lids and conjunctivae normal ENMT: Mucous membranes are moist. Posterior pharynx clear of any exudate or lesions.   Neck: normal, supple, no masses, no thyromegaly, no JVD. Respiratory: Fair air movement bilaterally, no wheezing, positive tachypnea, diffuse rhonchi (right more than left).  No using accessory muscles.  Clear to auscultation bilaterally, no wheezing, no crackles. Normal respiratory effort. No accessory muscle use.  Cardiovascular: Regular rate and rhythm, no murmurs / rubs / gallops. No extremity edema. 2+ pedal pulses. No carotid bruits.  Abdomen: no tenderness, no masses palpated. No hepatosplenomegaly. Bowel sounds positive.  Musculoskeletal: no clubbing /  cyanosis. No joint deformity upper and lower extremities. Good ROM, no contractures. Normal muscle tone.  Skin: no rashes, lesions, ulcers. No induration Neurologic: CN 2-12 grossly intact. Sensation intact, DTR normal. Strength 5/5 in all 4.  Psychiatric: Normal judgment and insight. Alert and oriented x 3. Normal mood.    Labs on Admission: I have personally reviewed the following labs and imaging studies  CBC: Recent Labs  Lab 12/20/18 1117  WBC 20.5*  NEUTROABS 17.9*  HGB 10.8*  HCT 34.4*  MCV 94.8  PLT 0000000*   Basic Metabolic Panel: Recent Labs  Lab 12/20/18 1117  NA 136  K 3.9  CL 98  CO2 27  GLUCOSE 112*  BUN <5*  CREATININE 0.53  CALCIUM 8.5*   GFR: Estimated Creatinine Clearance: 67.7 mL/min (by C-G formula based on SCr of 0.53 mg/dL).   Liver Function Tests: Recent Labs  Lab 12/20/18 1117  AST 14*  ALT 10  ALKPHOS 92  BILITOT 0.6  PROT 7.5  ALBUMIN 3.0*    Urine analysis:    Component Value Date/Time   COLORURINE YELLOW 03/07/2010 2049   APPEARANCEUR CLEAR 03/07/2010 2049   LABSPEC >1.030 (H) 03/07/2010 2049   PHURINE 5.5 03/07/2010 2049   GLUCOSEU NEGATIVE 03/07/2010 2049   HGBUR TRACE (A) 03/07/2010 2049   BILIRUBINUR SMALL (A) 03/07/2010 2049   KETONESUR 15 (A) 03/07/2010 2049   PROTEINUR NEGATIVE 03/07/2010 2049   UROBILINOGEN 0.2 03/07/2010 2049   NITRITE NEGATIVE 03/07/2010 2049   LEUKOCYTESUR NEGATIVE 03/07/2010 2049    Recent Results (from the past 240 hour(s))  SARS Coronavirus 2 [facility] order, Performed in [facility] hospital lab) Nasopharyngeal Nasopharyngeal Swab     Status: None   Collection Time: 12/20/18 10:18 AM   Specimen: Nasopharyngeal Swab  Result Value Ref Range Status   SARS Coronavirus 2 NEGATIVE NEGATIVE Final    Comment: (NOTE) If result is NEGATIVE SARS-CoV-2 target nucleic acids are NOT DETECTED. The SARS-CoV-2 RNA is generally detectable in upper and lower  respiratory specimens during the acute phase of infection. The lowest  concentration of SARS-CoV-2 viral copies this assay can detect is 250  copies / mL. A negative result does not preclude SARS-CoV-2 infection  and should not be used as the sole basis for treatment or other  patient management decisions.  A negative result may occur with  improper specimen collection / handling, submission of specimen other  than nasopharyngeal swab, presence of viral mutation(s) within the  areas targeted by this assay, and inadequate number of viral copies  (<250 copies / mL). A negative result must be combined with clinical  observations, patient history, and epidemiological information. If result is POSITIVE SARS-CoV-2 target nucleic acids are DETECTED. The SARS-CoV-2 RNA is generally detectable in upper and lower  respiratory specimens dur ing the acute phase of infection.  Positive  results are indicative of active infection with SARS-CoV-2.  Clinical   correlation with patient history and other diagnostic information is  necessary to determine patient infection status.  Positive results do  not rule out bacterial infection or co-infection with other viruses. If result is PRESUMPTIVE POSTIVE SARS-CoV-2 nucleic acids MAY BE PRESENT.   A presumptive positive result was obtained on the submitted specimen  and confirmed on repeat testing.  While 2019 novel coronavirus  (SARS-CoV-2) nucleic acids may be present in the submitted sample  additional confirmatory testing may be necessary for epidemiological  and / or clinical management purposes  to differentiate between  SARS-CoV-2 and other  Sarbecovirus currently known to infect humans.  If clinically indicated additional testing with an alternate test  methodology [phone]) is advised. The SARS-CoV-2 RNA is generally  detectable in upper and lower respiratory sp ecimens during the acute  phase of infection. The expected result is Negative. Fact Sheet for Patients:  StrictlyIdeas.no Fact Sheet for Healthcare Providers: BankingDealers.co.za This test is not yet approved or cleared by the [place] FDA and has been authorized for detection and/or diagnosis of SARS-CoV-2 by FDA under an Emergency Use Authorization (EUA).  This EUA will remain in effect (meaning this test can be used) for the duration of the COVID-19 declaration under Section 564(b)(1) of the Act, 21 U.S.C. section 360bbb-3(b)(1), unless the authorization is terminated or revoked sooner. Performed at [facility], [address] [place], [place] [address]      Radiological Exams on Admission: Dg Ribs Unilateral W/chest Right  Result Date: 12/20/2018 CLINICAL DATA:  Chest pain EXAM: RIGHT RIBS AND CHEST - 3+ VIEW COMPARISON:  Chest radiograph March 22, 2012 FINDINGS: Frontal chest as well as oblique and cone-down rib images obtained. There is extensive consolidation in the  right lower lung region. Lungs elsewhere are clear. Heart size and pulmonary vascularity are normal. No adenopathy. No rib fracture is evident. No destructive bone lesions. No pneumothorax or pleural effusion. IMPRESSION: Airspace opacity in the right lower lung region. Suspect pneumonia. An underlying mass is possible. Lungs elsewhere are clear. No bone lesion. No fracture. Followup PA and lateral radiographs recommended in 3-4 weeks following trial of antibiotic therapy to ensure resolution and exclude underlying malignancy. Electronically Signed   By: Lowella Grip III M.D.   On: 12/20/2018 11:06   Ct Chest Wo Contrast  Result Date: 12/20/2018 CLINICAL DATA:  Abnormal chest x-ray with cough for several days EXAM: CT CHEST WITHOUT CONTRAST TECHNIQUE: Multidetector CT imaging of the chest was performed following the standard protocol without IV contrast. COMPARISON:  Rib series from earlier in the same day. FINDINGS: Cardiovascular: Somewhat limited due to lack of IV contrast. Atherosclerotic calcifications are noted although no aneurysmal dilatation is seen. No cardiac enlargement is seen. No pericardial effusion is noted. Mediastinum/Nodes: The esophagus is within normal limits. They were are mediastinal nodes identified. A 10 mm short axis node is noted in the precarinal region with a 16 mm short axis node in the subcarinal region. Lack of IV contrast limits evaluation of the hilum somewhat although a 17 mm short axis node is noted in the right infrahilar region best seen on image number 80 of series 2. There are likely more right hilar nodes present although evaluation is somewhat difficult. Lungs/Pleura: The lungs are well aerated bilaterally. Bridging the minor fissure on the right there is a prominent soft tissue mass which measures approximately 5.2 x 3.9 cm in greatest dimension. Focal area of cavitation is suggested best seen on image number 69 of series 4. Some surrounding prominence of the  interlobular septa is noted as well as some wedge-shaped infiltrate with occluded bronchi. Small right-sided pleural effusion is noted as well. The left lung is clear. Upper Abdomen: Visualized upper abdomen demonstrates the adrenal glands to be within normal limits. No other abnormality in the upper abdomen is seen. Musculoskeletal: Bony structures show mild degenerative change of the thoracic spine. No bony destruction is seen. IMPRESSION: Masslike density in the right lung bridging the minor fissure with some associated peripheral inflammatory changes. Associated small effusion is present. Mediastinal and hilar adenopathy likely reactive in nature is  noted. These changes likely represent aggressive pneumonia given the short history and lack of associated ancillary findings to suggest neoplasm. Appropriate therapy is recommended and a follow-up contrast enhanced CT in 3-4 weeks is recommended to assess for resolution. Aortic Atherosclerosis (ICD10-I70.0). Electronically Signed   By: Inez Catalina M.D.   On: 12/20/2018 12:10    EKG: none   Assessment/Plan 1-chest pain/shortness of breath secondary to RLL pneumonia ([place]) -Admit as observation to [place] -Follow blood cultures, urine culture, strep antigen and Legionella antigen in urine and follow sputum cultures. -IV antibiotics using Levaquin (base on allergies profile) -Flutter valve/incentive spirometer and the use of DuoNeb -provide IVF's, PRN antipyretics and analgesics -follow clinical response -Provide PRN oxygen supplementation and wean off to room air as tolerated.  2-hyperlipidemia -Continue Lovaza and statins. -Checking lipid panel.  3-hypothyroidism -Continue Synthroid. -Normal TSH recently checked  4-GERD -will use Pepcid  5-hx of stroke/carotid artery disease -continue aspirin, plavix and statins  -no acute neurologic Deficit reported.  6-tobacco abuse -cessation counseling provided -started on nicotine patch    DVT  prophylaxis: lovenox Code Status: Full Code Family Communication: no family at bedside Disposition Plan: anticipate discharge back home once medically stable Consults called: none  Admission status: observation, LOS < 2 midnights, med-surg bed.    Time Spent: 70 minutes.  Barton Dubois MD [facility] Pager [phone]   12/20/2018, 1:57 PM

## 2018-12-21 DIAGNOSIS — J189 Pneumonia, unspecified organism: Secondary | ICD-10-CM | POA: Diagnosis present

## 2018-12-21 DIAGNOSIS — Z880 Allergy status to penicillin: Secondary | ICD-10-CM | POA: Diagnosis not present

## 2018-12-21 DIAGNOSIS — J9601 Acute respiratory failure with hypoxia: Secondary | ICD-10-CM | POA: Diagnosis present

## 2018-12-21 DIAGNOSIS — Z7982 Long term (current) use of aspirin: Secondary | ICD-10-CM | POA: Diagnosis not present

## 2018-12-21 DIAGNOSIS — Z882 Allergy status to sulfonamides status: Secondary | ICD-10-CM | POA: Diagnosis not present

## 2018-12-21 DIAGNOSIS — J9811 Atelectasis: Secondary | ICD-10-CM | POA: Diagnosis present

## 2018-12-21 DIAGNOSIS — E039 Hypothyroidism, unspecified: Secondary | ICD-10-CM | POA: Diagnosis present

## 2018-12-21 DIAGNOSIS — F17210 Nicotine dependence, cigarettes, uncomplicated: Secondary | ICD-10-CM | POA: Diagnosis present

## 2018-12-21 DIAGNOSIS — K219 Gastro-esophageal reflux disease without esophagitis: Secondary | ICD-10-CM | POA: Diagnosis present

## 2018-12-21 DIAGNOSIS — E785 Hyperlipidemia, unspecified: Secondary | ICD-10-CM | POA: Diagnosis present

## 2018-12-21 DIAGNOSIS — E876 Hypokalemia: Secondary | ICD-10-CM | POA: Diagnosis present

## 2018-12-21 DIAGNOSIS — Z20828 Contact with and (suspected) exposure to other viral communicable diseases: Secondary | ICD-10-CM | POA: Diagnosis present

## 2018-12-21 DIAGNOSIS — Z8541 Personal history of malignant neoplasm of cervix uteri: Secondary | ICD-10-CM | POA: Diagnosis not present

## 2018-12-21 DIAGNOSIS — I69311 Memory deficit following cerebral infarction: Secondary | ICD-10-CM | POA: Diagnosis not present

## 2018-12-21 DIAGNOSIS — Z90710 Acquired absence of both cervix and uterus: Secondary | ICD-10-CM | POA: Diagnosis not present

## 2018-12-21 DIAGNOSIS — J181 Lobar pneumonia, unspecified organism: Secondary | ICD-10-CM | POA: Diagnosis not present

## 2018-12-21 DIAGNOSIS — Z888 Allergy status to other drugs, medicaments and biological substances status: Secondary | ICD-10-CM | POA: Diagnosis not present

## 2018-12-21 DIAGNOSIS — G4700 Insomnia, unspecified: Secondary | ICD-10-CM | POA: Diagnosis not present

## 2018-12-21 DIAGNOSIS — Z8711 Personal history of peptic ulcer disease: Secondary | ICD-10-CM | POA: Diagnosis not present

## 2018-12-21 DIAGNOSIS — Z885 Allergy status to narcotic agent status: Secondary | ICD-10-CM | POA: Diagnosis not present

## 2018-12-21 DIAGNOSIS — F419 Anxiety disorder, unspecified: Secondary | ICD-10-CM | POA: Diagnosis present

## 2018-12-21 DIAGNOSIS — J440 Chronic obstructive pulmonary disease with acute lower respiratory infection: Secondary | ICD-10-CM | POA: Diagnosis present

## 2018-12-21 DIAGNOSIS — E872 Acidosis: Secondary | ICD-10-CM | POA: Diagnosis not present

## 2018-12-21 DIAGNOSIS — M791 Myalgia, unspecified site: Secondary | ICD-10-CM | POA: Diagnosis present

## 2018-12-21 DIAGNOSIS — I6523 Occlusion and stenosis of bilateral carotid arteries: Secondary | ICD-10-CM | POA: Diagnosis present

## 2018-12-21 LAB — LACTIC ACID, PLASMA: Lactic Acid, Venous: 0.9 mmol/L (ref 0.5–1.9)

## 2018-12-21 LAB — BASIC METABOLIC PANEL
Anion gap: 9 (ref 5–15)
BUN: 8 mg/dL (ref 6–20)
CO2: 26 mmol/L (ref 22–32)
Calcium: 7.6 mg/dL — ABNORMAL LOW (ref 8.9–10.3)
Chloride: 97 mmol/L — ABNORMAL LOW (ref 98–111)
Creatinine, Ser: 0.62 mg/dL (ref 0.44–1.00)
GFR calc Af Amer: >60 mL/min (ref >60)
GFR calc non Af Amer: >60 mL/min (ref >60)
Glucose, Bld: 85 mg/dL (ref 70–99)
Potassium: 3.4 mmol/L — ABNORMAL LOW (ref 3.5–5.1)
Sodium: 132 mmol/L — ABNORMAL LOW (ref 135–145)

## 2018-12-21 LAB — CBC
HCT: 30 % — ABNORMAL LOW (ref 36.0–46.0)
Hemoglobin: 9.6 g/dL — ABNORMAL LOW (ref 12.0–15.0)
MCH: 30.2 pg (ref 26.0–34.0)
MCHC: 32 g/dL (ref 30.0–36.0)
MCV: 94.3 fL (ref 80.0–100.0)
Platelets: 634 10*3/uL — ABNORMAL HIGH (ref 150–400)
RBC: 3.18 MIL/uL — ABNORMAL LOW (ref 3.87–5.11)
RDW: 11.9 % (ref 11.5–15.5)
WBC: 27.2 10*3/uL — ABNORMAL HIGH (ref 4.0–10.5)
nRBC: 0 % (ref 0.0–0.2)

## 2018-12-21 LAB — LIPID PANEL
Cholesterol: 70 mg/dL (ref 0–200)
HDL: 29 mg/dL — ABNORMAL LOW (ref >40)
LDL Cholesterol: 28 mg/dL (ref 0–99)
Total CHOL/HDL Ratio: 2.4 RATIO
Triglycerides: 63 mg/dL (ref <150)
VLDL: 13 mg/dL (ref 0–40)

## 2018-12-21 LAB — HIV ANTIBODY (ROUTINE TESTING W REFLEX): HIV Screen 4th Generation wRfx: NONREACTIVE

## 2018-12-21 LAB — MRSA PCR SCREENING: MRSA by PCR: NEGATIVE

## 2018-12-21 LAB — STREP PNEUMONIAE URINARY ANTIGEN: Strep Pneumo Urinary Antigen: NEGATIVE

## 2018-12-21 MED ORDER — POTASSIUM CHLORIDE CRYS ER 20 MEQ PO TBCR
40.0000 meq | EXTENDED_RELEASE_TABLET | Freq: Two times a day (BID) | ORAL | Status: AC
  Administered 2018-12-21 (×2): 40 meq via ORAL
  Filled 2018-12-21 (×2): qty 2

## 2018-12-21 MED ORDER — METHYLPREDNISOLONE SODIUM SUCC 40 MG IJ SOLR
40.0000 mg | Freq: Two times a day (BID) | INTRAMUSCULAR | Status: DC
  Administered 2018-12-21 – 2018-12-23 (×6): 40 mg via INTRAVENOUS
  Filled 2018-12-21 (×8): qty 1

## 2018-12-21 MED ORDER — CABERGOLINE 0.5 MG PO TABS: 0.2500 mg | ORAL_TABLET | ORAL | Status: DC

## 2018-12-21 MED ORDER — LIDOCAINE 5 % EX PTCH
1.0000 | MEDICATED_PATCH | CUTANEOUS | Status: DC
  Administered 2018-12-21 – 2018-12-27 (×7): 1 via TRANSDERMAL
  Filled 2018-12-21 (×7): qty 1

## 2018-12-21 MED ORDER — SODIUM CHLORIDE 0.9 % IV SOLN: 2.0000 g | Freq: Three times a day (TID) | INTRAVENOUS | Status: DC

## 2018-12-21 MED ORDER — MAGNESIUM SULFATE 2 GM/50ML IV SOLN
2.0000 g | Freq: Once | INTRAVENOUS | Status: AC
  Administered 2018-12-21: 2 g via INTRAVENOUS
  Filled 2018-12-21: qty 50

## 2018-12-21 MED ORDER — METRONIDAZOLE IN NACL 5-0.79 MG/ML-% IV SOLN
500.0000 mg | Freq: Three times a day (TID) | INTRAVENOUS | Status: DC
  Administered 2018-12-21 – 2018-12-25 (×12): 500 mg via INTRAVENOUS
  Filled 2018-12-21 (×13): qty 100

## 2018-12-21 MED ORDER — SODIUM CHLORIDE 0.9 % IV SOLN
2.0000 g | Freq: Three times a day (TID) | INTRAVENOUS | Status: DC
  Administered 2018-12-21 – 2018-12-27 (×18): 2 g via INTRAVENOUS
  Filled 2018-12-21 (×17): qty 2

## 2018-12-21 MED ORDER — HYDROMORPHONE HCL 1 MG/ML IJ SOLN
0.5000 mg | INTRAMUSCULAR | Status: DC | PRN
  Administered 2018-12-22 – 2018-12-26 (×10): 0.5 mg via INTRAVENOUS
  Filled 2018-12-21 (×12): qty 0.5

## 2018-12-21 NOTE — Progress Notes (Signed)
PROGRESS NOTE    AAMIA UPTEGROVE  [id number] DOB: 1971/01/21 DOA: 12/20/2018 PCP: Sander Radon, NP   Brief Narrative:  Per HPI: [name] is a 48 y.o. female with past medical history significant for gastroesophageal reflux disease, hyperlipidemia, tobacco abuse, hypothyroidism, carotid artery disease and previous stroke (without significant residual deficit); who presented to the emergency department secondary to right-sided chest discomfort, shortness of breath and coughing spells.  Patient reports significant nonproductive cough over the last 3 days and on the day of admission reports difficulty catching her breath and feeling short of breath.  Patient denies any specific chest pain, no nausea, no vomiting, no headaches, no blurred vision, no focal weakness, no dysuria, no hematuria, no melena, no sick contacts or any other complaints. In the ED chest x-ray and CT chest demonstrated right lower lobe pneumonia; patient with WBCs in the 20,000 range) chronically with leukocytosis in the 12-16 K range), patient found to be febrile and experiencing tachypnea with significant right-sided chest discomfort.  Cultures were taken, IV fluids initiated and IV antibiotics given.  TRH has been called on the patient for further evaluation and management.  9/9: Patient noted to have significant right-sided chest pain that has been persistent since admission.  She does not appear to be responding well to IV Levaquin and will switch to IV Zosyn today after discussion with pulmonology.  We will also add IV steroids to assist with symptoms.  Will likely require thoracentesis in the outpatient setting.  Continue pain medications.   Assessment & Plan:   Principal Problem:   RLL pneumonia ([place]) Active Problems:   Carotid artery disease ([place])   Hyperlipidemia   GERD (gastroesophageal reflux disease)   Stroke ([facility])   Tobacco abuse   1-acute hypoxemic respiratory failure secondary to RLL pneumonia with  associated abscess -Switch from IV Levaquin to IV Zosyn today and consider addition of vancomycin based on MRSA PCR swab -Follow blood cultures, urine culture, strep antigen and Legionella antigen in urine and follow sputum cultures. -IV Solu-Medrol twice daily added on for pleuritic chest pain -Flutter valve/incentive spirometer and the use of DuoNeb -provide IVF's, PRN antipyretics and analgesics -follow clinical response -Will require thoracentesis inpatient versus outpatient based on therapeutic response -Provide PRN oxygen supplementation and wean off to room air as tolerated.  2-hyperlipidemia -Hold Lovaza and statins with LDL noted to be 28  3-hypothyroidism -Continue Synthroid. -Normal TSH recently checked  4-GERD -will use Pepcid  5-hx of stroke/carotid artery disease -continue aspirin, plavix and statins  -no acute neurologic Deficit reported.  6-tobacco abuse with likely early COPD -cessation counseling provided -Patient declining nicotine patch  7-hypokalemia/hypomagnesemia -Replete and reevaluate in a.m.   DVT prophylaxis:Lovenox Code Status: Full Family Communication: None at bedside Disposition Plan: Continue current treatment and monitor closely. Will need inpatient care due to ongoing need for IVF and IV antibiotics.   Consultants:   None  Procedures:   None  Antimicrobials:  Anti-infectives (From admission, onward)   Start     Dose/Rate Route Frequency Ordered Stop   12/20/18 1353  levofloxacin (LEVAQUIN) IVPB 750 mg     750 mg 100 mL/hr over 90 Minutes Intravenous Every 24 hours 12/20/18 1353 12/25/18 1359   12/20/18 1330  levofloxacin (LEVAQUIN) IVPB 500 mg  Status:  Discontinued     500 mg 100 mL/hr over 60 Minutes Intravenous  Once 12/20/18 1318 12/20/18 1358       Subjective: Patient seen and evaluated today with ongoing right-sided chest  wall pain that is fairly significant.  She states that any amount of breathing or coughing  seems to worsen her pain.  Objective: Vitals:   12/20/18 1959 12/20/18 2120 12/20/18 2200 12/21/18 0506  BP:  94/67 (!) 110/98 (!) 85/56  Pulse:  79 80 71  Resp:  (!) 28 20 (!) 22  Temp:  (!) 97.5 F (36.4 C) 98.3 F (36.8 C) 98 F (36.7 C)  TempSrc:  Oral  Oral  SpO2: 98% 94% 95% 93%  Weight:      Height:        Intake/Output Summary (Last 24 hours) at 12/21/2018 0752 Last data filed at 12/21/2018 0644 Gross per 24 hour  Intake 271.67 ml  Output 200 ml  Net 71.67 ml   Filed Weights   12/20/18 0957 12/20/18 1441  Weight: 49.9 kg 46.2 kg    Examination:  General exam: Appears calm and comfortable  Respiratory system: Clear to auscultation. Respiratory effort normal.  Currently on 4 L nasal cannula. Cardiovascular system: S1 & S2 heard, RRR. No JVD, murmurs, rubs, gallops or clicks. No pedal edema. Gastrointestinal system: Abdomen is nondistended, soft and nontender. No organomegaly or masses felt. Normal bowel sounds heard. Central nervous system: Alert and oriented. No focal neurological deficits. Extremities: Symmetric 5 x 5 power. Skin: No rashes, lesions or ulcers Psychiatry: Judgement and insight appear normal. Mood & affect appropriate.     Data Reviewed: I have personally reviewed following labs and imaging studies  CBC: Recent Labs  Lab 12/20/18 1117 12/21/18 0517  WBC 20.5* 27.2*  NEUTROABS 17.9*  --   HGB 10.8* 9.6*  HCT 34.4* 30.0*  MCV 94.8 94.3  PLT 663* [id number]*   Basic Metabolic Panel: Recent Labs  Lab 12/20/18 1117 12/21/18 0517  NA 136 132*  K 3.9 3.4*  CL 98 97*  CO2 27 26  GLUCOSE 112* 85  BUN <5* 8  CREATININE 0.53 0.62  CALCIUM 8.5* 7.6*  MG 1.4*  --    GFR: Estimated Creatinine Clearance: 62.7 mL/min (by C-G formula based on SCr of 0.62 mg/dL). Liver Function Tests: Recent Labs  Lab 12/20/18 1117  AST 14*  ALT 10  ALKPHOS 92  BILITOT 0.6  PROT 7.5  ALBUMIN 3.0*   No results for input(s): LIPASE, AMYLASE in the last 168  hours. No results for input(s): AMMONIA in the last 168 hours. Coagulation Profile: No results for input(s): INR, PROTIME in the last 168 hours. Cardiac Enzymes: No results for input(s): CKTOTAL, CKMB, CKMBINDEX, TROPONINI in the last 168 hours. BNP (last 3 results) No results for input(s): PROBNP in the last 8760 hours. HbA1C: No results for input(s): HGBA1C in the last 72 hours. CBG: No results for input(s): GLUCAP in the last 168 hours. Lipid Profile: Recent Labs    12/21/18 0517  CHOL 70  HDL 29*  LDLCALC 28  TRIG 63  CHOLHDL 2.4   Thyroid Function Tests: No results for input(s): TSH, T4TOTAL, FREET4, T3FREE, THYROIDAB in the last 72 hours. Anemia Panel: No results for input(s): VITAMINB12, FOLATE, FERRITIN, TIBC, IRON, RETICCTPCT in the last 72 hours. Sepsis Labs: No results for input(s): PROCALCITON, LATICACIDVEN in the last 168 hours.  Recent Results (from the past 240 hour(s))  SARS Coronavirus 2 [facility] order, Performed in [facility] hospital lab) Nasopharyngeal Nasopharyngeal Swab     Status: None   Collection Time: 12/20/18 10:18 AM   Specimen: Nasopharyngeal Swab  Result Value Ref Range Status   SARS Coronavirus 2 NEGATIVE NEGATIVE  Final    Comment: (NOTE) If result is NEGATIVE SARS-CoV-2 target nucleic acids are NOT DETECTED. The SARS-CoV-2 RNA is generally detectable in upper and lower  respiratory specimens during the acute phase of infection. The lowest  concentration of SARS-CoV-2 viral copies this assay can detect is 250  copies / mL. A negative result does not preclude SARS-CoV-2 infection  and should not be used as the sole basis for treatment or other  patient management decisions.  A negative result may occur with  improper specimen collection / handling, submission of specimen other  than nasopharyngeal swab, presence of viral mutation(s) within the  areas targeted by this assay, and inadequate number of viral copies  (<250 copies / mL). A  negative result must be combined with clinical  observations, patient history, and epidemiological information. If result is POSITIVE SARS-CoV-2 target nucleic acids are DETECTED. The SARS-CoV-2 RNA is generally detectable in upper and lower  respiratory specimens dur ing the acute phase of infection.  Positive  results are indicative of active infection with SARS-CoV-2.  Clinical  correlation with patient history and other diagnostic information is  necessary to determine patient infection status.  Positive results do  not rule out bacterial infection or co-infection with other viruses. If result is PRESUMPTIVE POSTIVE SARS-CoV-2 nucleic acids MAY BE PRESENT.   A presumptive positive result was obtained on the submitted specimen  and confirmed on repeat testing.  While 2019 novel coronavirus  (SARS-CoV-2) nucleic acids may be present in the submitted sample  additional confirmatory testing may be necessary for epidemiological  and / or clinical management purposes  to differentiate between  SARS-CoV-2 and other Sarbecovirus currently known to infect humans.  If clinically indicated additional testing with an alternate test  methodology [id number]) is advised. The SARS-CoV-2 RNA is generally  detectable in upper and lower respiratory sp ecimens during the acute  phase of infection. The expected result is Negative. Fact Sheet for Patients:  StrictlyIdeas.no Fact Sheet for Healthcare Providers: BankingDealers.co.za This test is not yet approved or cleared by the [place] FDA and has been authorized for detection and/or diagnosis of SARS-CoV-2 by FDA under an Emergency Use Authorization (EUA).  This EUA will remain in effect (meaning this test can be used) for the duration of the COVID-19 declaration under Section 564(b)(1) of the Act, 21 U.S.C. section 360bbb-3(b)(1), unless the authorization is terminated or revoked sooner. Performed  at [facility], [address], [place], [place] [address]   Blood culture (routine x 2)     Status: None (Preliminary result)   Collection Time: 12/20/18  2:08 PM   Specimen: BLOOD RIGHT HAND  Result Value Ref Range Status   Specimen Description BLOOD RIGHT HAND  Final   Special Requests   Final    BOTTLES DRAWN AEROBIC AND ANAEROBIC Blood Culture adequate volume   Culture   Final    NO GROWTH < 24 HOURS Performed at [facility], [address] [place], [place] [address]    Report Status PENDING  Incomplete  Blood culture (routine x 2)     Status: None (Preliminary result)   Collection Time: 12/20/18  2:09 PM   Specimen: BLOOD RIGHT HAND  Result Value Ref Range Status   Specimen Description BLOOD RIGHT HAND  Final   Special Requests   Final    BOTTLES DRAWN AEROBIC AND ANAEROBIC Blood Culture adequate volume   Culture   Final    NO GROWTH < 24 HOURS Performed at [facility], [address], [place], [place] [address]    Report Status PENDING  Incomplete         Radiology Studies: Dg Ribs Unilateral W/chest Right  Result Date: 12/20/2018 CLINICAL DATA:  Chest pain EXAM: RIGHT RIBS AND CHEST - 3+ VIEW COMPARISON:  Chest radiograph March 22, 2012 FINDINGS: Frontal chest as well as oblique and cone-down rib images obtained. There is extensive consolidation in the right lower lung region. Lungs elsewhere are clear. Heart size and pulmonary vascularity are normal. No adenopathy. No rib fracture is evident. No destructive bone lesions. No pneumothorax or pleural effusion. IMPRESSION: Airspace opacity in the right lower lung region. Suspect pneumonia. An underlying mass is possible. Lungs elsewhere are clear. No bone lesion. No fracture. Followup PA and lateral radiographs recommended in 3-4 weeks following trial of antibiotic therapy to ensure resolution and exclude underlying malignancy. Electronically Signed   By: Lowella Grip III M.D.   On: 12/20/2018 11:06   Ct Chest Wo  Contrast  Result Date: 12/20/2018 CLINICAL DATA:  Abnormal chest x-ray with cough for several days EXAM: CT CHEST WITHOUT CONTRAST TECHNIQUE: Multidetector CT imaging of the chest was performed following the standard protocol without IV contrast. COMPARISON:  Rib series from earlier in the same day. FINDINGS: Cardiovascular: Somewhat limited due to lack of IV contrast. Atherosclerotic calcifications are noted although no aneurysmal dilatation is seen. No cardiac enlargement is seen. No pericardial effusion is noted. Mediastinum/Nodes: The esophagus is within normal limits. They were are mediastinal nodes identified. A 10 mm short axis node is noted in the precarinal region with a 16 mm short axis node in the subcarinal region. Lack of IV contrast limits evaluation of the hilum somewhat although a 17 mm short axis node is noted in the right infrahilar region best seen on image number 80 of series 2. There are likely more right hilar nodes present although evaluation is somewhat difficult. Lungs/Pleura: The lungs are well aerated bilaterally. Bridging the minor fissure on the right there is a prominent soft tissue mass which measures approximately 5.2 x 3.9 cm in greatest dimension. Focal area of cavitation is suggested best seen on image number 69 of series 4. Some surrounding prominence of the interlobular septa is noted as well as some wedge-shaped infiltrate with occluded bronchi. Small right-sided pleural effusion is noted as well. The left lung is clear. Upper Abdomen: Visualized upper abdomen demonstrates the adrenal glands to be within normal limits. No other abnormality in the upper abdomen is seen. Musculoskeletal: Bony structures show mild degenerative change of the thoracic spine. No bony destruction is seen. IMPRESSION: Masslike density in the right lung bridging the minor fissure with some associated peripheral inflammatory changes. Associated small effusion is present. Mediastinal and hilar adenopathy  likely reactive in nature is noted. These changes likely represent aggressive pneumonia given the short history and lack of associated ancillary findings to suggest neoplasm. Appropriate therapy is recommended and a follow-up contrast enhanced CT in 3-4 weeks is recommended to assess for resolution. Aortic Atherosclerosis (ICD10-I70.0). Electronically Signed   By: Inez Catalina M.D.   On: 12/20/2018 12:10        Scheduled Meds:  aspirin EC  81 mg Oral Daily   clopidogrel  75 mg Oral Daily   enoxaparin (LOVENOX) injection  40 mg Subcutaneous Q24H   estradiol  2 mg Oral QHS   famotidine  20 mg Oral BID   feeding supplement (ENSURE ENLIVE)  237 mL Oral BID BM   levothyroxine  50 mcg Oral Daily   multivitamin with minerals  1 tablet Oral Daily   omega-3 acid ethyl esters  2 g Oral BID   potassium chloride  40 mEq Oral BID   rosuvastatin  20 mg Oral q1800   Continuous Infusions:  sodium chloride 100 mL/hr at 12/20/18 1524   levofloxacin (LEVAQUIN) IV 750 mg (12/20/18 1525)   magnesium sulfate bolus IVPB       LOS: 0 days    Time spent: 30 minutes    Shakala Marlatt Darleen Crocker, DO [facility] Pager [phone]  If 7PM-7AM, please contact night-coverage [email or web address] Password [email or web address] 12/21/2018, 7:52 AM

## 2018-12-21 NOTE — Progress Notes (Signed)
Patient sent home medications home with her husband this afternoon. Pharmacy requested she bring in home supply of cabergaline since it is not on hospital pharmacy formulary. Discussed with patient, reminded her that medication will need to be given to nursing to send to the pharmacy for use until discharge home. Verbalized understanding and states her husband can bring in tomorrow. Notified Donna Christen Coffee, PharmD this afternoon.

## 2018-12-21 NOTE — Progress Notes (Signed)
Initial Nutrition Assessment  DOCUMENTATION CODES:   Not applicable  INTERVENTION:  -Ensure Enlive po BID, each supplement provides 350 kcal and 20 grams of protein -MVI   NUTRITION DIAGNOSIS:   Increased nutrient needs related to acute illness as evidenced by estimated needs.   GOAL:   Patient will meet greater than or equal to 90% of their needs   MONITOR:   PO intake, Weight trends, Supplement acceptance, Labs, I & O's  REASON FOR ASSESSMENT:   Malnutrition Screening Tool    ASSESSMENT:  RD working remotely.  48 year old female with past medical history significant of CAD, cervical cancer, chronic headaches, GERD, HLD, hypothyroidism, pituitary tumor, TIA, who presented to ED for evaluation of coughing spells, SOB, and rt sided chest discomfort over the past 3 days. Chest X-ray and CT demonstrated right lower lobe pneumonia.  Per chart review, pt with persistent right sided chest pain since admission. Likely will require outpt thoracentesis.  Eating 25-100% of meals at this time. Ensure ordered BID. Will continue to monitor for po at meal time as well as supplement acceptance.  Current wt 46.2 kg (101.6 lb) UBW 48.9 kg - 49.9 kg over the past year Weight history reviewed: Noted 6 lb wt loss over the past 6 months (insignificant for time frame)  Medications reviewed and include: cabergoline, plavix, lovenox, estrace, pepcid, synthroid, methylprednisolone, MVI, potassium chloride 40 mEq, maxipime, flagyl  Labs: Na 132 K 3.4 WBC 27.2 Hemoglobin 9.6  NUTRITION - FOCUSED PHYSICAL EXAM: Unable to complete at this time; RD working remotely.  Diet Order:   Diet Order            Diet Heart Room service appropriate? Yes; Fluid consistency: Thin  Diet effective now              EDUCATION NEEDS:   No education needs have been identified at this time  Skin:  Skin Assessment: Reviewed RN Assessment  Last BM:  9/7  Height:   Ht Readings from Last 1  Encounters:  12/20/18 5\' 2"  (1.575 m)    Weight:   Wt Readings from Last 1 Encounters:  12/20/18 46.2 kg    Ideal Body Weight:  50 kg  BMI:  Body mass index is 18.63 kg/m.  Estimated Nutritional Needs:   Kcal:  1400-1600  Protein:  70-80  Fluid:  >1.4L/day  Lajuan Lines, RD, [place] [phone] After Hours/Weekend Pager: [phone]

## 2018-12-21 NOTE — Progress Notes (Signed)
Pharmacy Antibiotic Note  [name] is a 48 y.o. female admitted on 12/20/2018 with pneumonia and abscess.  Pharmacy has been consulted for Cefepime dosing.  Plan: Cefepime 2000 mg IV every 8 hours. Flagyl 500 mg IV every 8 hours. Monitor labs, c/s, and patient improvement.  Height: 5\' 2"  (157.5 cm) Weight: 101 lb 13.6 oz (46.2 kg) IBW/kg (Calculated) : 50.1  Temp (24hrs), Avg:98.8 F (37.1 C), Min:97.5 F (36.4 C), Max:101.3 F (38.5 C)  Recent Labs  Lab 12/20/18 1117 12/21/18 0517  WBC 20.5* 27.2*  CREATININE 0.53 0.62    Estimated Creatinine Clearance: 62.7 mL/min (by C-G formula based on SCr of 0.62 mg/dL).    Allergies  Allergen Reactions  . Pantoprazole Sodium Itching and Rash  . Penicillins Itching and Rash    Has patient had a PCN reaction causing immediate rash, facial/tongue/throat swelling, SOB or lightheadedness with hypotension: Yes Has patient had a PCN reaction causing severe rash involving mucus membranes or skin necrosis: No Has patient had a PCN reaction that required hospitalization: No Has patient had a PCN reaction occurring within the last 10 years: No If all of the above answers are "NO", then may proceed with Cephalosporin use.   . Sulfa Antibiotics Itching and Rash  . Codeine Nausea And Vomiting  . Demerol [Meperidine]     Red streaks on arm after coadministration of phenergan and demerol at time of EGD in 2012. Tolerated phenergan with subsequent EGD.    Antimicrobials this admission: Cefepime 9/9 >>  Flagyl 9/9 >>  Levaquin 9/8 >> 9/9  Dose adjustments this admission: N/A  Microbiology results: 9/8 BCx: ngtd  9/8 Sputum: pending  9/9 MRSA PCR: pending  Thank you for allowing pharmacy to be a part of this patient's care.  Ramond Craver 12/21/2018 10:07 AM

## 2018-12-21 NOTE — Progress Notes (Signed)
Patient had O2 off and stated "the doctor asked me to try without wearing it." O2 sats 86% on r/a. Assisted back to bed after using restroom and reapplied O2 at 4 lpm. O2 sats 93%. BP 90/66. Patient c/o right side chest wall pain with coughing, deep breathing and activity. Requested dilaudid for pain.reports toradol has not been helping pain. Discussed with Dr. Manuella Ghazi. Stated would d/c dilaudid and order lidoderm patch instead due to low BP. Also requested nursing wean O2 today. Discussed with patient, lidoderm patch applied as ordered. Verbalized understanding. O2 sats 95% on 3 lpm. Donavan Foil, RN

## 2018-12-22 LAB — CBC WITH DIFFERENTIAL/PLATELET
Abs Immature Granulocytes: 0.6 10*3/uL — ABNORMAL HIGH (ref 0.00–0.07)
Basophils Absolute: 0.1 10*3/uL (ref 0.0–0.1)
Basophils Relative: 0 %
Eosinophils Absolute: 0 10*3/uL (ref 0.0–0.5)
Eosinophils Relative: 0 %
HCT: 32 % — ABNORMAL LOW (ref 36.0–46.0)
Hemoglobin: 10.1 g/dL — ABNORMAL LOW (ref 12.0–15.0)
Immature Granulocytes: 2 %
Lymphocytes Relative: 4 %
Lymphs Abs: 1.6 10*3/uL (ref 0.7–4.0)
MCH: 30.3 pg (ref 26.0–34.0)
MCHC: 31.6 g/dL (ref 30.0–36.0)
MCV: 96.1 fL (ref 80.0–100.0)
Monocytes Absolute: 1.1 10*3/uL — ABNORMAL HIGH (ref 0.1–1.0)
Monocytes Relative: 3 %
Neutro Abs: 33.8 10*3/uL — ABNORMAL HIGH (ref 1.7–7.7)
Neutrophils Relative %: 91 %
Platelets: 699 10*3/uL — ABNORMAL HIGH (ref 150–400)
RBC: 3.33 MIL/uL — ABNORMAL LOW (ref 3.87–5.11)
RDW: 12.3 % (ref 11.5–15.5)
WBC: 37.1 10*3/uL — ABNORMAL HIGH (ref 4.0–10.5)
nRBC: 0 % (ref 0.0–0.2)

## 2018-12-22 LAB — EXPECTORATED SPUTUM ASSESSMENT W GRAM STAIN, RFLX TO RESP C

## 2018-12-22 LAB — BASIC METABOLIC PANEL
Anion gap: 9 (ref 5–15)
BUN: 8 mg/dL (ref 6–20)
CO2: 22 mmol/L (ref 22–32)
Calcium: 8.5 mg/dL — ABNORMAL LOW (ref 8.9–10.3)
Chloride: 107 mmol/L (ref 98–111)
Creatinine, Ser: 0.5 mg/dL (ref 0.44–1.00)
GFR calc Af Amer: >60 mL/min (ref >60)
GFR calc non Af Amer: >60 mL/min (ref >60)
Glucose, Bld: 174 mg/dL — ABNORMAL HIGH (ref 70–99)
Potassium: 4.4 mmol/L (ref 3.5–5.1)
Sodium: 138 mmol/L (ref 135–145)

## 2018-12-22 LAB — MAGNESIUM: Magnesium: 2 mg/dL (ref 1.7–2.4)

## 2018-12-22 LAB — LACTIC ACID, PLASMA: Lactic Acid, Venous: 2.3 mmol/L (ref 0.5–1.9)

## 2018-12-22 MED ORDER — LACTATED RINGERS IV SOLN
INTRAVENOUS | Status: DC
  Administered 2018-12-22 – 2018-12-23 (×2): via INTRAVENOUS

## 2018-12-22 MED ORDER — LIDOCAINE 5 % EX PTCH
1.0000 | MEDICATED_PATCH | Freq: Once | CUTANEOUS | Status: AC
  Administered 2018-12-22: 1 via TRANSDERMAL
  Filled 2018-12-22: qty 1

## 2018-12-22 MED ORDER — LACTATED RINGERS IV SOLN: INTRAVENOUS | Status: DC

## 2018-12-22 MED ORDER — LEVOFLOXACIN IN D5W 500 MG/100ML IV SOLN
500.0000 mg | INTRAVENOUS | Status: DC
  Administered 2018-12-22 – 2018-12-23 (×2): 500 mg via INTRAVENOUS
  Filled 2018-12-22 (×3): qty 100

## 2018-12-22 MED ORDER — GUAIFENESIN ER 600 MG PO TB12
1200.0000 mg | ORAL_TABLET | Freq: Two times a day (BID) | ORAL | Status: DC
  Administered 2018-12-22 – 2018-12-27 (×11): 1200 mg via ORAL
  Filled 2018-12-22 (×11): qty 2

## 2018-12-22 MED ORDER — CABERGOLINE 0.5 MG PO TABS
0.2500 mg | ORAL_TABLET | ORAL | Status: DC
  Administered 2018-12-22 – 2018-12-26 (×3): 0.25 mg via ORAL
  Filled 2018-12-22: qty 1

## 2018-12-22 MED ORDER — LACTATED RINGERS IV BOLUS
1500.0000 mL | Freq: Once | INTRAVENOUS | Status: AC
  Administered 2018-12-22: 1500 mL via INTRAVENOUS

## 2018-12-22 NOTE — Progress Notes (Signed)
CRITICAL VALUE ALERT  Critical Value:  Lactic 2.3  Date & Time Notied:  7:45 AM   Provider Notified: shah  Orders Received/Actions taken: see mar

## 2018-12-22 NOTE — Progress Notes (Signed)
PROGRESS NOTE    [name]  [id number] DOB: [birth year] DOA: 12/20/2018 PCP: Sander Radon, NP   Brief Narrative:  Per HPI: [name] a 48 y.o.femalewith past medical history significant for gastroesophageal reflux disease, hyperlipidemia, tobacco abuse, hypothyroidism,carotid artery disease and previous stroke (without significant residual deficit);who presented to the emergency department secondary to right-sided chest discomfort, shortness of breath and coughing spells. Patient reports significant nonproductive cough over the last 3 days and on the day of admission reports difficulty catching her breath and feeling short of breath. Patient denies any specific chest pain, no nausea, no vomiting, no headaches, no blurred vision, no focal weakness, no dysuria, no hematuria, no melena, no sick contacts or any other complaints. In the ED chest x-ray and CT chest demonstrated right lower lobe pneumonia;patient with WBCs in the 20,000 range) chronically with leukocytosis in the 12-16Krange),patient found to be febrile and experiencing tachypnea with significant right-sided chest discomfort. Cultures were taken, IV fluids initiated and IV antibiotics given. [facility] has been called on the patient for further evaluation and management.  9/9: Patient noted to have significant right-sided chest pain that has been persistent since admission.  She does not appear to be responding well to IV Levaquin and will switch to IV Zosyn today after discussion with pulmonology.  We will also add IV steroids to assist with symptoms.  Will likely require thoracentesis in the outpatient setting.  Continue pain medications.  9/10: Worsening leukocytosis as well as lactic acidosis noted this morning.  Clinically patient appears stable.  Fluid bolus initiated for lactic acidosis today and will monitor blood pressures and repeat lactic acid a.m.  Pulmonology consulted with recommendations appreciated to  include reinitiation of Levaquin until Legionella antigen returns.   Assessment & Plan:   Principal Problem:   RLL pneumonia ([place]) Active Problems:   Carotid artery disease ([place])   Hyperlipidemia   GERD (gastroesophageal reflux disease)   Stroke ([facility]CC)   Tobacco abuse   Acute hypoxemic respiratory failure (HCC)   1-acute hypoxemic respiratory failure secondary toRLL pneumonia with associated abscess and worsening lactic acidosis -Continue IV cefepime, Flagyl, and Levaquin added 9/10 by pulmonology for Legionella coverage with urine antigen pending. -Follow blood cultures, urine culture, strep antigen and Legionella antigen in urine and follow sputum cultures. -IV Solu-Medrol twice daily added on for pleuritic chest pain on 9/9-likely contributing to worsening leukocytosis at this point -Flutter valve/incentive spirometer and the use of DuoNeb -IV fluid bolus and aggressive rate for lactic acidosis which will reevaluate in a.m. -follow clinical response -Will require thoracentesis inpatient versus outpatient based on therapeutic response -Provide PRN oxygen supplementation and wean off to room air as tolerated. -Continue pain management with Lidoderm patch.  2-hyperlipidemia -Hold Lovaza and statins with LDL noted to be 28  3-hypothyroidism -Continue Synthroid. -Normal TSH recently checked  4-GERD -will use Pepcid  5-hx of stroke/carotid artery disease -continue aspirin, plavix and statins  -no acute neurologic Deficit reported.  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   DVT prophylaxis:Lovenox Code Status: Full Family Communication: None at bedside Disposition Plan: Continue current treatment and monitor closely. Will need inpatient care due to ongoing need for IVF and IV antibiotics.  Consultants:   Pulmonology   Procedures:   None  Antimicrobials:  Anti-infectives (From admission, onward)   Start     Dose/Rate Route Frequency Ordered Stop   12/22/18 0930  levofloxacin (LEVAQUIN)  IVPB 500 mg     500 mg 100 mL/hr over 60 Minutes Intravenous Every 24 hours 12/22/18 0829     12/21/18 1300  ceFEPIme (MAXIPIME) 2 g in sodium chloride 0.9 % 100 mL IVPB     2 g 200 mL/hr over 30 Minutes Intravenous Every 8 hours 12/21/18 1254     12/21/18 1000  ceFEPIme (MAXIPIME) 2 g in sodium chloride 0.9 % 100 mL IVPB  Status:  Discontinued     2 g 200 mL/hr over 30 Minutes Intravenous Every 8 hours 12/21/18 0953 12/21/18 1254   12/21/18 1000  metroNIDAZOLE (FLAGYL) IVPB 500 mg     500 mg 100 mL/hr over 60 Minutes Intravenous Every 8 hours 12/21/18 0953     12/20/18 1353  levofloxacin (LEVAQUIN) IVPB 750 mg  Status:  Discontinued     750 mg 100 mL/hr over 90 Minutes Intravenous Every 24 hours 12/20/18 1353 12/21/18 0810   12/20/18 1330  levofloxacin (LEVAQUIN) IVPB 500 mg  Status:  Discontinued     500 mg 100 mL/hr over 60 Minutes Intravenous  Once 12/20/18 1318 12/20/18 1358       Subjective: Patient seen and evaluated today with improvement to right chest wall pain noted this morning.  She continues to have persistent, nonproductive cough, however.  She is eager to get up and ambulate.  No fever noted in the last 24 hours.  Objective: Vitals:   12/21/18 1954 12/21/18 2141 12/22/18 0235 12/22/18 0527  BP:  96/63 106/65 92/61  Pulse:  81 74 70  Resp:  (!) 22  20  Temp:  98 F (36.7 C) 98.3 F (36.8 C) 97.7 F (36.5 C)  TempSrc:  Oral Oral Oral  SpO2: 92% 93% 92% 92%  Weight:      Height:        Intake/Output Summary (Last 24 hours) at 12/22/2018 1100 Last data filed at 12/22/2018 0900 Gross per 24 hour  Intake 1100 ml  Output 3500 ml  Net -2400 ml   Filed Weights   12/20/18 0957 12/20/18 1441  Weight: 49.9 kg 46.2 kg    Examination:  General exam: Appears calm and comfortable   Respiratory system: Clear to auscultation. Respiratory effort normal.  Currently on 4 L nasal cannula. Cardiovascular system: S1 & S2 heard, RRR. No JVD, murmurs, rubs, gallops or clicks. No pedal edema. Gastrointestinal system: Abdomen is nondistended, soft and nontender. No organomegaly or masses felt. Normal bowel sounds heard. Central nervous system: Alert and oriented. No focal neurological deficits. Extremities: Symmetric 5 x 5 power. Skin: No rashes, lesions or ulcers Psychiatry: Judgement and insight appear normal. Mood & affect appropriate.     Data Reviewed: I have personally reviewed following labs and imaging studies  CBC: Recent Labs  Lab 12/20/18 1117 12/21/18 0517 12/22/18 0614  WBC 20.5* 27.2* 37.1*  NEUTROABS 17.9*  --  33.8*  HGB 10.8* 9.6* 10.1*  HCT 34.4* 30.0* 32.0*  MCV 94.8 94.3 96.1  PLT 663* 634* [id number]*   Basic Metabolic Panel: Recent Labs  Lab 12/20/18 1117 12/21/18 0517 12/22/18 0614  NA 136 132* 138  K 3.9 3.4* 4.4  CL 98 97* 107  CO2 27 26 22   GLUCOSE 112* 85 174*  BUN <5* 8 8  CREATININE 0.53 0.62 0.50  CALCIUM 8.5* 7.6* 8.5*  MG 1.4*  --  2.0   GFR: Estimated Creatinine Clearance: 62.7 mL/min (by C-G formula based on SCr of 0.5 mg/dL). Liver Function Tests: Recent Labs  Lab  12/20/18 1117  AST 14*  ALT 10  ALKPHOS 92  BILITOT 0.6  PROT 7.5  ALBUMIN 3.0*   No results for input(s): LIPASE, AMYLASE in the last 168 hours. No results for input(s): AMMONIA in the last 168 hours. Coagulation Profile: No results for input(s): INR, PROTIME in the last 168 hours. Cardiac Enzymes: No results for input(s): CKTOTAL, CKMB, CKMBINDEX, TROPONINI in the last 168 hours. BNP (last 3 results) No results for input(s): PROBNP in the last 8760 hours. HbA1C: No results for input(s): HGBA1C in the last 72 hours. CBG: No results for input(s): GLUCAP in the last 168 hours. Lipid Profile: Recent Labs    12/21/18 0517  CHOL 70  HDL 29*  LDLCALC  28  TRIG 63  CHOLHDL 2.4   Thyroid Function Tests: No results for input(s): TSH, T4TOTAL, FREET4, T3FREE, THYROIDAB in the last 72 hours. Anemia Panel: No results for input(s): VITAMINB12, FOLATE, FERRITIN, TIBC, IRON, RETICCTPCT in the last 72 hours. Sepsis Labs: Recent Labs  Lab 12/21/18 1546 12/22/18 0614  LATICACIDVEN 0.9 2.3*    Recent Results (from the past 240 hour(s))  SARS Coronavirus 2 [facility] order, Performed in [facility] hospital lab) Nasopharyngeal Nasopharyngeal Swab     Status: None   Collection Time: 12/20/18 10:18 AM   Specimen: Nasopharyngeal Swab  Result Value Ref Range Status   SARS Coronavirus 2 NEGATIVE NEGATIVE Final    Comment: (NOTE) If result is NEGATIVE SARS-CoV-2 target nucleic acids are NOT DETECTED. The SARS-CoV-2 RNA is generally detectable in upper and lower  respiratory specimens during the acute phase of infection. The lowest  concentration of SARS-CoV-2 viral copies this assay can detect is 250  copies / mL. A negative result does not preclude SARS-CoV-2 infection  and should not be used as the sole basis for treatment or other  patient management decisions.  A negative result may occur with  improper specimen collection / handling, submission of specimen other  than nasopharyngeal swab, presence of viral mutation(s) within the  areas targeted by this assay, and inadequate number of viral copies  (<250 copies / mL). A negative result must be combined with clinical  observations, patient history, and epidemiological information. If result is POSITIVE SARS-CoV-2 target nucleic acids are DETECTED. The SARS-CoV-2 RNA is generally detectable in upper and lower  respiratory specimens dur ing the acute phase of infection.  Positive  results are indicative of active infection with SARS-CoV-2.  Clinical  correlation with patient history and other diagnostic information is  necessary to determine patient infection status.  Positive results do   not rule out bacterial infection or co-infection with other viruses. If result is PRESUMPTIVE POSTIVE SARS-CoV-2 nucleic acids MAY BE PRESENT.   A presumptive positive result was obtained on the submitted specimen  and confirmed on repeat testing.  While 2019 novel coronavirus  (SARS-CoV-2) nucleic acids may be present in the submitted sample  additional confirmatory testing may be necessary for epidemiological  and / or clinical management purposes  to differentiate between  SARS-CoV-2 and other Sarbecovirus currently known to infect humans.  If clinically indicated additional testing with an alternate test  methodology [phone]) is advised. The SARS-CoV-2 RNA is generally  detectable in upper and lower respiratory sp ecimens during the acute  phase of infection. The expected result is Negative. Fact Sheet for Patients:  StrictlyIdeas.no Fact Sheet for Healthcare Providers: BankingDealers.co.za This test is not yet approved or cleared by the [place] FDA and has been authorized for detection  and/or diagnosis of SARS-CoV-2 by FDA under an Emergency Use Authorization (EUA).  This EUA will remain in effect (meaning this test can be used) for the duration of the COVID-19 declaration under Section 564(b)(1) of the Act, 21 U.S.C. section 360bbb-3(b)(1), unless the authorization is terminated or revoked sooner. Performed at [facility], [address] [place], [place] [address]   Blood culture (routine x 2)     Status: None (Preliminary result)   Collection Time: 12/20/18  2:08 PM   Specimen: BLOOD RIGHT HAND  Result Value Ref Range Status   Specimen Description BLOOD RIGHT HAND  Final   Special Requests   Final    BOTTLES DRAWN AEROBIC AND ANAEROBIC Blood Culture adequate volume   Culture   Final    NO GROWTH 2 DAYS Performed at [facility], [address] [place], [place] [address]    Report Status PENDING  Incomplete  Blood  culture (routine x 2)     Status: None (Preliminary result)   Collection Time: 12/20/18  2:09 PM   Specimen: BLOOD RIGHT HAND  Result Value Ref Range Status   Specimen Description BLOOD RIGHT HAND  Final   Special Requests   Final    BOTTLES DRAWN AEROBIC AND ANAEROBIC Blood Culture adequate volume   Culture   Final    NO GROWTH 2 DAYS Performed at [facility], [address], [place], [place] [address]    Report Status PENDING  Incomplete  MRSA PCR Screening     Status: None   Collection Time: 12/21/18  8:10 AM   Specimen: Nasal Mucosa; Nasopharyngeal  Result Value Ref Range Status   MRSA by PCR NEGATIVE NEGATIVE Final    Comment:        The GeneXpert MRSA Assay (FDA approved for NASAL specimens only), is one component of a comprehensive MRSA colonization surveillance program. It is not intended to diagnose MRSA infection nor to guide or monitor treatment for MRSA infections. Performed at [facility], [address] [place],  [address]   Sputum culture     Status: None   Collection Time: 12/22/18 12:36 AM   Specimen: Expectorated Sputum  Result Value Ref Range Status   Specimen Description EXPECTORATED SPUTUM  Final   Special Requests NONE  Final   Sputum evaluation   Final    THIS SPECIMEN IS ACCEPTABLE FOR SPUTUM CULTURE Performed at [facility], [address], [place],  [address]    Report Status 12/22/2018 FINAL  Final         Radiology Studies: Ct Chest Wo Contrast  Result Date: 12/20/2018 CLINICAL DATA:  Abnormal chest x-ray with cough for several days EXAM: CT CHEST WITHOUT CONTRAST TECHNIQUE: Multidetector CT imaging of the chest was performed following the standard protocol without IV contrast. COMPARISON:  Rib series from earlier in the same day. FINDINGS: Cardiovascular: Somewhat limited due to lack of IV contrast. Atherosclerotic calcifications are noted although no aneurysmal dilatation is seen. No cardiac enlargement is seen. No  pericardial effusion is noted. Mediastinum/Nodes: The esophagus is within normal limits. They were are mediastinal nodes identified. A 10 mm short axis node is noted in the precarinal region with a 16 mm short axis node in the subcarinal region. Lack of IV contrast limits evaluation of the hilum somewhat although a 17 mm short axis node is noted in the right infrahilar region best seen on image number 80 of series 2. There are likely more right hilar nodes present although evaluation is somewhat  difficult. Lungs/Pleura: The lungs are well aerated bilaterally. Bridging the minor fissure on the right there is a prominent soft tissue mass which measures approximately 5.2 x 3.9 cm in greatest dimension. Focal area of cavitation is suggested best seen on image number 69 of series 4. Some surrounding prominence of the interlobular septa is noted as well as some wedge-shaped infiltrate with occluded bronchi. Small right-sided pleural effusion is noted as well. The left lung is clear. Upper Abdomen: Visualized upper abdomen demonstrates the adrenal glands to be within normal limits. No other abnormality in the upper abdomen is seen. Musculoskeletal: Bony structures show mild degenerative change of the thoracic spine. No bony destruction is seen. IMPRESSION: Masslike density in the right lung bridging the minor fissure with some associated peripheral inflammatory changes. Associated small effusion is present. Mediastinal and hilar adenopathy likely reactive in nature is noted. These changes likely represent aggressive pneumonia given the short history and lack of associated ancillary findings to suggest neoplasm. Appropriate therapy is recommended and a follow-up contrast enhanced CT in 3-4 weeks is recommended to assess for resolution. Aortic Atherosclerosis (ICD10-I70.0). Electronically Signed   By: Inez Catalina M.D.   On: 12/20/2018 12:10        Scheduled Meds: . aspirin EC  81 mg Oral Daily  . cabergoline  0.25  mg Oral 2 times weekly  . clopidogrel  75 mg Oral Daily  . enoxaparin (LOVENOX) injection  40 mg Subcutaneous Q24H  . estradiol  2 mg Oral QHS  . famotidine  20 mg Oral BID  . feeding supplement (ENSURE ENLIVE)  237 mL Oral BID BM  . guaiFENesin  1,200 mg Oral BID  . levothyroxine  50 mcg Oral Daily  . lidocaine  1 patch Transdermal Q24H  . methylPREDNISolone (SOLU-MEDROL) injection  40 mg Intravenous Q12H  . multivitamin with minerals  1 tablet Oral Daily   Continuous Infusions: . ceFEPime (MAXIPIME) IV 2 g (12/22/18 0533)  . lactated ringers    . lactated ringers 75 mL/hr at 12/22/18 1050  . levofloxacin (LEVAQUIN) IV    . metronidazole 500 mg (12/22/18 0029)     LOS: 1 day    Time spent: 30 minutes    Quayshawn Nin Darleen Crocker, DO [facility] Pager [phone]  If 7PM-7AM, please contact night-coverage [email or web address] Password [email or web address] 12/22/2018, 11:00 AM

## 2018-12-22 NOTE — Consult Note (Signed)
Consult requested by: [facility] hospitalist, Dr. Manuella Ghazi Consult requested for: Abnormal chest CT  HPI: This is a 48 year old who has a complicated medical history with COPD previous pulmonary nodules long-term smoking history peptic ulcer disease hypothyroidism hyperlipidemia carotid arterial occlusive disease previous stroke and abnormal white blood cell count and platelet count.  She had bone marrow biopsy done in April that was inconclusive.  She had previous CT in 2012 that showed a pulmonary nodule.  She says her current problem began when she had the sudden onset of right-sided chest discomfort shortness of breath and coughing.  She says she has had a nonproductive cough for about 3 days prior to admission but then on the day of admission she felt like something "tore" in her chest and she had severe pain and was unable to breathe.  She has an extensive smoking history but has been reducing her cigarette consumption and is now down to about 3 cigarettes a day.  Chest x-ray in the emergency room showed a right lower lobe pneumonia with elevated white blood cell count.  She had CT chest that shows some cavity in the area and a fairly large area of infiltrate/mass.  She is still having right-sided pleuritic chest pain.  She says she actually feels a little bit better this morning although her white blood count has gone up to 37,000 and she has elevated lactic acid level now.  She is coughing but having difficulty getting anything up.  She did cough up some blood prior to admission.  She is not having any abdominal pain nausea vomiting diarrhea.  She does admit to anxiety.  No neurological symptoms.  Past Medical History:  Diagnosis Date  . Bleeding ulcer   . Bronchitis    hx of  . Carotid artery disease ([place])    bilateral internal carotid artery occlusion by duplex ultrasound  . Cervical cancer ([facility])    cervical  . Chronic headaches   . Common migraine with intractable migraine 02/22/2017  . GERD  (gastroesophageal reflux disease)   . Hyperlipidemia   . Hypothyroidism   . Pituitary tumor   . PUD (peptic ulcer disease)    NSAIDS   . Stenosis of right carotid artery   . Stroke ([place])    mini-stroke; memory deficits, slight  . TIA (transient ischemic attack)      Family History  Problem Relation Age of Onset  . Diabetes Mother   . COPD Mother   . Heart disease Father   . Cervical cancer Sister   . Colon cancer Neg Hx   . Colon polyps Neg Hx      Social History   Socioeconomic History  . Marital status: Married    Spouse name: Sherren Mocha  . Number of children: 3  . Years of education: 20  . Highest education level: Not on file  Occupational History  . Occupation: unemployed    Fish farm manager: UNEMPLOYED  Social Needs  . Financial resource strain: Not on file  . Food insecurity    Worry: Not on file    Inability: Not on file  . Transportation needs    Medical: Not on file    Non-medical: Not on file  Tobacco Use  . Smoking status: Current Every Day Smoker    Packs/day: 1.00    Years: 25.00    Pack years: 25.00    Types: Cigarettes  . Smokeless tobacco: Never Used  Substance and Sexual Activity  . Alcohol use: Yes    Comment: once  a week  . Drug use: No  . Sexual activity: Yes    Partners: Male    Birth control/protection: Surgical    Comment: spouse  Lifestyle  . Physical activity    Days per week: Not on file    Minutes per session: Not on file  . Stress: Not on file  Relationships  . Social Herbalist on phone: Not on file    Gets together: Not on file    Attends religious service: Not on file    Active member of club or organization: Not on file    Attends meetings of clubs or organizations: Not on file    Relationship status: Not on file  Other Topics Concern  . Not on file  Social History Narrative   Lives with spouse and kids   Caffeine use:      ROS: Except as mentioned 10 point review of systems is negative    Objective: Vital  signs in last 24 hours: Temp:  [97.7 F (36.5 C)-98.9 F (37.2 C)] 97.7 F (36.5 C) (09/10 0527) Pulse Rate:  [70-85] 70 (09/10 0527) Resp:  [18-22] 20 (09/10 0527) BP: (90-106)/(59-66) 92/61 (09/10 0527) SpO2:  [86 %-93 %] 92 % (09/10 0527) Weight change:  Last BM Date: 12/19/18  Intake/Output from previous day: 09/09 0701 - 09/10 0700 In: 1070 [P.O.:720; IV Piggyback:350] Out: 3100 [Urine:3100]  PHYSICAL EXAM Constitutional: She is a thin female who is in no acute distress now.  Eyes: Pupils react ears nose mouth and throat: Throat is clear hearing grossly normal.  Cardiovascular: Her heart is regular with normal heart sounds.  Respiratory: She does splint her right side of her chest somewhat and has some rales on the right.  Gastrointestinal: Her abdomen soft with no masses.  Musculoskeletal: Grossly normal strength neurological: No focal abnormalities.  Psychiatric: She seems mildly anxious  Lab Results: Basic Metabolic Panel: Recent Labs    12/20/18 1117 12/21/18 0517 12/22/18 0614  NA 136 132* 138  K 3.9 3.4* 4.4  CL 98 97* 107  CO2 27 26 22   GLUCOSE 112* 85 174*  BUN <5* 8 8  CREATININE 0.53 0.62 0.50  CALCIUM 8.5* 7.6* 8.5*  MG 1.4*  --  2.0   Liver Function Tests: Recent Labs    12/20/18 1117  AST 14*  ALT 10  ALKPHOS 92  BILITOT 0.6  PROT 7.5  ALBUMIN 3.0*   No results for input(s): LIPASE, AMYLASE in the last 72 hours. No results for input(s): AMMONIA in the last 72 hours. CBC: Recent Labs    12/20/18 1117 12/21/18 0517 12/22/18 0614  WBC 20.5* 27.2* 37.1*  NEUTROABS 17.9*  --  33.8*  HGB 10.8* 9.6* 10.1*  HCT 34.4* 30.0* 32.0*  MCV 94.8 94.3 96.1  PLT 663* 634* 699*   Cardiac Enzymes: No results for input(s): CKTOTAL, CKMB, CKMBINDEX, TROPONINI in the last 72 hours. BNP: No results for input(s): PROBNP in the last 72 hours. D-Dimer: No results for input(s): DDIMER in the last 72 hours. CBG: No results for input(s): GLUCAP in the last  72 hours. Hemoglobin A1C: No results for input(s): HGBA1C in the last 72 hours. Fasting Lipid Panel: Recent Labs    12/21/18 0517  CHOL 70  HDL 29*  LDLCALC 28  TRIG 63  CHOLHDL 2.4   Thyroid Function Tests: No results for input(s): TSH, T4TOTAL, FREET4, T3FREE, THYROIDAB in the last 72 hours. Anemia Panel: No results for input(s): VITAMINB12, FOLATE, FERRITIN,  TIBC, IRON, RETICCTPCT in the last 72 hours. Coagulation: No results for input(s): LABPROT, INR in the last 72 hours. Urine Drug Screen: Drugs of Abuse  No results found for: LABOPIA, COCAINSCRNUR, LABBENZ, AMPHETMU, THCU, LABBARB  Alcohol Level: No results for input(s): ETH in the last 72 hours. Urinalysis: No results for input(s): COLORURINE, LABSPEC, PHURINE, GLUCOSEU, HGBUR, BILIRUBINUR, KETONESUR, PROTEINUR, UROBILINOGEN, NITRITE, LEUKOCYTESUR in the last 72 hours.  Invalid input(s): APPERANCEUR Misc. Labs:   ABGS: No results for input(s): PHART, PO2ART, TCO2, HCO3 in the last 72 hours.  Invalid input(s): PCO2   MICROBIOLOGY: Recent Results (from the past 240 hour(s))  SARS Coronavirus 2 [facility] order, Performed in [facility] hospital lab) Nasopharyngeal Nasopharyngeal Swab     Status: None   Collection Time: 12/20/18 10:18 AM   Specimen: Nasopharyngeal Swab  Result Value Ref Range Status   SARS Coronavirus 2 NEGATIVE NEGATIVE Final    Comment: (NOTE) If result is NEGATIVE SARS-CoV-2 target nucleic acids are NOT DETECTED. The SARS-CoV-2 RNA is generally detectable in upper and lower  respiratory specimens during the acute phase of infection. The lowest  concentration of SARS-CoV-2 viral copies this assay can detect is 250  copies / mL. A negative result does not preclude SARS-CoV-2 infection  and should not be used as the sole basis for treatment or other  patient management decisions.  A negative result may occur with  improper specimen collection / handling, submission of specimen other  than  nasopharyngeal swab, presence of viral mutation(s) within the  areas targeted by this assay, and inadequate number of viral copies  (<250 copies / mL). A negative result must be combined with clinical  observations, patient history, and epidemiological information. If result is POSITIVE SARS-CoV-2 target nucleic acids are DETECTED. The SARS-CoV-2 RNA is generally detectable in upper and lower  respiratory specimens dur ing the acute phase of infection.  Positive  results are indicative of active infection with SARS-CoV-2.  Clinical  correlation with patient history and other diagnostic information is  necessary to determine patient infection status.  Positive results do  not rule out bacterial infection or co-infection with other viruses. If result is PRESUMPTIVE POSTIVE SARS-CoV-2 nucleic acids MAY BE PRESENT.   A presumptive positive result was obtained on the submitted specimen  and confirmed on repeat testing.  While 2019 novel coronavirus  (SARS-CoV-2) nucleic acids may be present in the submitted sample  additional confirmatory testing may be necessary for epidemiological  and / or clinical management purposes  to differentiate between  SARS-CoV-2 and other Sarbecovirus currently known to infect humans.  If clinically indicated additional testing with an alternate test  methodology [phone]) is advised. The SARS-CoV-2 RNA is generally  detectable in upper and lower respiratory sp ecimens during the acute  phase of infection. The expected result is Negative. Fact Sheet for Patients:  StrictlyIdeas.no Fact Sheet for Healthcare Providers: BankingDealers.co.za This test is not yet approved or cleared by the [place] FDA and has been authorized for detection and/or diagnosis of SARS-CoV-2 by FDA under an Emergency Use Authorization (EUA).  This EUA will remain in effect (meaning this test can be used) for the duration of  the COVID-19 declaration under Section 564(b)(1) of the Act, 21 U.S.C. section 360bbb-3(b)(1), unless the authorization is terminated or revoked sooner. Performed at [facility], [address], [place], [place] [address]   Blood culture (routine x 2)     Status: None (Preliminary result)   Collection Time: 12/20/18  2:08 PM  Specimen: BLOOD RIGHT HAND  Result Value Ref Range Status   Specimen Description BLOOD RIGHT HAND  Final   Special Requests   Final    BOTTLES DRAWN AEROBIC AND ANAEROBIC Blood Culture adequate volume   Culture   Final    NO GROWTH 2 DAYS Performed at [facility], [address] [place], [place] [address]    Report Status PENDING  Incomplete  Blood culture (routine x 2)     Status: None (Preliminary result)   Collection Time: 12/20/18  2:09 PM   Specimen: BLOOD RIGHT HAND  Result Value Ref Range Status   Specimen Description BLOOD RIGHT HAND  Final   Special Requests   Final    BOTTLES DRAWN AEROBIC AND ANAEROBIC Blood Culture adequate volume   Culture   Final    NO GROWTH 2 DAYS Performed at [facility], [address] [place], [place] [address]    Report Status PENDING  Incomplete  MRSA PCR Screening     Status: None   Collection Time: 12/21/18  8:10 AM   Specimen: Nasal Mucosa; Nasopharyngeal  Result Value Ref Range Status   MRSA by PCR NEGATIVE NEGATIVE Final    Comment:        The GeneXpert MRSA Assay (FDA approved for NASAL specimens only), is one component of a comprehensive MRSA colonization surveillance program. It is not intended to diagnose MRSA infection nor to guide or monitor treatment for MRSA infections. Performed at [facility], [address], [place], [place] [address]   Sputum culture     Status: None   Collection Time: 12/22/18 12:36 AM   Specimen: Expectorated Sputum  Result Value Ref Range Status   Specimen Description EXPECTORATED SPUTUM  Final   Special Requests NONE  Final   Sputum evaluation   Final    THIS  SPECIMEN IS ACCEPTABLE FOR SPUTUM CULTURE Performed at [facility], [address], [place], [place] [address]    Report Status 12/22/2018 FINAL  Final    Studies/Results: Dg Ribs Unilateral W/chest Right  Result Date: 12/20/2018 CLINICAL DATA:  Chest pain EXAM: RIGHT RIBS AND CHEST - 3+ VIEW COMPARISON:  Chest radiograph March 22, 2012 FINDINGS: Frontal chest as well as oblique and cone-down rib images obtained. There is extensive consolidation in the right lower lung region. Lungs elsewhere are clear. Heart size and pulmonary vascularity are normal. No adenopathy. No rib fracture is evident. No destructive bone lesions. No pneumothorax or pleural effusion. IMPRESSION: Airspace opacity in the right lower lung region. Suspect pneumonia. An underlying mass is possible. Lungs elsewhere are clear. No bone lesion. No fracture. Followup PA and lateral radiographs recommended in 3-4 weeks following trial of antibiotic therapy to ensure resolution and exclude underlying malignancy. Electronically Signed   By: Lowella Grip III M.D.   On: 12/20/2018 11:06   Ct Chest Wo Contrast  Result Date: 12/20/2018 CLINICAL DATA:  Abnormal chest x-ray with cough for several days EXAM: CT CHEST WITHOUT CONTRAST TECHNIQUE: Multidetector CT imaging of the chest was performed following the standard protocol without IV contrast. COMPARISON:  Rib series from earlier in the same day. FINDINGS: Cardiovascular: Somewhat limited due to lack of IV contrast. Atherosclerotic calcifications are noted although no aneurysmal dilatation is seen. No cardiac enlargement is seen. No pericardial effusion is noted. Mediastinum/Nodes: The esophagus is within normal limits. They were are mediastinal nodes identified. A 10 mm short axis node is noted in the precarinal region with a 16 mm short axis node in  the subcarinal region. Lack of IV contrast limits evaluation of the hilum somewhat although a 17 mm short axis node is noted in the right  infrahilar region best seen on image number 80 of series 2. There are likely more right hilar nodes present although evaluation is somewhat difficult. Lungs/Pleura: The lungs are well aerated bilaterally. Bridging the minor fissure on the right there is a prominent soft tissue mass which measures approximately 5.2 x 3.9 cm in greatest dimension. Focal area of cavitation is suggested best seen on image number 69 of series 4. Some surrounding prominence of the interlobular septa is noted as well as some wedge-shaped infiltrate with occluded bronchi. Small right-sided pleural effusion is noted as well. The left lung is clear. Upper Abdomen: Visualized upper abdomen demonstrates the adrenal glands to be within normal limits. No other abnormality in the upper abdomen is seen. Musculoskeletal: Bony structures show mild degenerative change of the thoracic spine. No bony destruction is seen. IMPRESSION: Masslike density in the right lung bridging the minor fissure with some associated peripheral inflammatory changes. Associated small effusion is present. Mediastinal and hilar adenopathy likely reactive in nature is noted. These changes likely represent aggressive pneumonia given the short history and lack of associated ancillary findings to suggest neoplasm. Appropriate therapy is recommended and a follow-up contrast enhanced CT in 3-4 weeks is recommended to assess for resolution. Aortic Atherosclerosis (ICD10-I70.0). Electronically Signed   By: Inez Catalina M.D.   On: 12/20/2018 12:10    Medications:  Prior to Admission:  Medications Prior to Admission  Medication Sig Dispense Refill Last Dose  . aspirin EC 81 MG tablet Take 81 mg by mouth daily.   12/19/2018 at 900  . cabergoline (DOSTINEX) 0.5 MG tablet Take 0.5 tablets (0.25 mg total) by mouth 2 (two) times a week. 10 tablet 2 12/19/2018 at Unknown time  . clopidogrel (PLAVIX) 75 MG tablet Take 1 tablet (75 mg total) by mouth daily. 30 tablet 0 12/19/2018 at 900  .  estradiol (ESTRACE) 2 MG tablet TAKE 1 TABLET BY MOUTH ONCE DAILY. 30 tablet 11 12/19/2018 at Unknown time  . Icosapent Ethyl (VASCEPA) 1 G CAPS Take 2 g by mouth 2 (two) times daily.    12/19/2018 at Unknown time  . lansoprazole (PREVACID) 30 MG capsule TAKE 1 CAPSULE IN THE MORNING (1 HOUR BEFORE BREAKFAST) 30 capsule 5 12/19/2018 at Unknown time  . levothyroxine (SYNTHROID) 50 MCG tablet Take 1 tablet (50 mcg total) by mouth daily. 90 tablet 1 12/19/2018 at Unknown time  . Multiple Vitamin (MULTIVITAMIN) tablet Take 1 tablet by mouth daily.   12/19/2018 at Unknown time  . rosuvastatin (CRESTOR) 20 MG tablet Take 20 mg by mouth daily.   12/19/2018 at Unknown time  . naproxen sodium (ALEVE) 220 MG tablet Take 220 mg by mouth daily as needed (pain).     . ranitidine (ZANTAC) 300 MG tablet TAKE ONE TABLET BY MOUTH AT BEDTIME. (Patient not taking: Reported on 12/20/2018) 30 tablet 5 Not Taking at Unknown time   Scheduled: . aspirin EC  81 mg Oral Daily  . cabergoline  0.25 mg Oral 2 times weekly  . clopidogrel  75 mg Oral Daily  . enoxaparin (LOVENOX) injection  40 mg Subcutaneous Q24H  . estradiol  2 mg Oral QHS  . famotidine  20 mg Oral BID  . feeding supplement (ENSURE ENLIVE)  237 mL Oral BID BM  . guaiFENesin  1,200 mg Oral BID  . levothyroxine  50 mcg Oral  Daily  . lidocaine  1 patch Transdermal Q24H  . methylPREDNISolone (SOLU-MEDROL) injection  40 mg Intravenous Q12H  . multivitamin with minerals  1 tablet Oral Daily   Continuous: . ceFEPime (MAXIPIME) IV 2 g (12/22/18 0533)  . lactated ringers    . lactated ringers    . lactated ringers    . metronidazole 500 mg (12/22/18 0029)   CXF:[id number], alum & mag hydroxide-simeth, HYDROmorphone (DILAUDID) injection, ipratropium-albuterol, ketorolac  Assesment: She has right lower lobe pneumonia and actually some in the middle lobe as well.  She has an area of cavitation and that is being treated as if it is a lung abscess.  Legionella can cause  a cavitary pneumonia and she had been on Levaquin.  Legionella I urine antigen is pending and I would probably put her back on Levaquin until that is back if it is negative.  She has chronic leukocytosis but it is generally in the 12-16,000 range and her white blood counts have up to 37,000 now.  She is on steroids which may be part of this  She has tobacco abuse and is wanting to stop.  She says she does not feel like she needs a patch  Her lactate level is elevated this morning and she is been somewhat hypotensive.  She is receiving a fluid bolus which I think is appropriate.  She does not look overtly septic right now  She has carotid arterial disease Principal Problem:   RLL pneumonia ([place]) Active Problems:   Carotid artery disease (H[facility])   Hyperlipidemia   GERD (gastroesophageal reflux disease)   Stroke ([place])   Tobacco abuse   Acute hypoxemic respiratory failure ([place])    Plan: Reinstitute Levaquin.  Continue other treatments.  Agree with fluid bolus and replacement.  I added a flutter valve incentive spirometry and Mucinex  Presuming that she clinically improves she will need to have another CT in about a month.    LOS: 1 day   [name] 12/22/2018, 8:18 AM

## 2018-12-23 LAB — BASIC METABOLIC PANEL
Anion gap: 9 (ref 5–15)
BUN: 10 mg/dL (ref 6–20)
CO2: 24 mmol/L (ref 22–32)
Calcium: 8.6 mg/dL — ABNORMAL LOW (ref 8.9–10.3)
Chloride: 111 mmol/L (ref 98–111)
Creatinine, Ser: 0.59 mg/dL (ref 0.44–1.00)
GFR calc Af Amer: >60 mL/min (ref >60)
GFR calc non Af Amer: >60 mL/min (ref >60)
Glucose, Bld: 193 mg/dL — ABNORMAL HIGH (ref 70–99)
Potassium: 4.2 mmol/L (ref 3.5–5.1)
Sodium: 144 mmol/L (ref 135–145)

## 2018-12-23 LAB — LEGIONELLA PNEUMOPHILA SEROGP 1 UR AG: L. pneumophila Serogp 1 Ur Ag: NEGATIVE

## 2018-12-23 LAB — CBC
HCT: 31.7 % — ABNORMAL LOW (ref 36.0–46.0)
Hemoglobin: 10 g/dL — ABNORMAL LOW (ref 12.0–15.0)
MCH: 30.4 pg (ref 26.0–34.0)
MCHC: 31.5 g/dL (ref 30.0–36.0)
MCV: 96.4 fL (ref 80.0–100.0)
Platelets: 745 10*3/uL — ABNORMAL HIGH (ref 150–400)
RBC: 3.29 MIL/uL — ABNORMAL LOW (ref 3.87–5.11)
RDW: 12.8 % (ref 11.5–15.5)
WBC: 38.5 10*3/uL — ABNORMAL HIGH (ref 4.0–10.5)
nRBC: 0 % (ref 0.0–0.2)

## 2018-12-23 LAB — LACTIC ACID, PLASMA: Lactic Acid, Venous: 2.9 mmol/L (ref 0.5–1.9)

## 2018-12-23 MED ORDER — SALINE SPRAY 0.65 % NA SOLN
1.0000 | NASAL | Status: DC | PRN
  Administered 2018-12-23: 1 via NASAL
  Filled 2018-12-23: qty 44

## 2018-12-23 NOTE — Progress Notes (Signed)
PROGRESS NOTE    [name]  [id number] DOB: [birth year] DOA: 12/20/2018 PCP: Sander Radon, NP   Brief Narrative:  Per HPI: [name] a 48 y.o.femalewith past medical history significant for gastroesophageal reflux disease, hyperlipidemia, tobacco abuse, hypothyroidism,carotid artery disease and previous stroke (without significant residual deficit);who presented to the emergency department secondary to right-sided chest discomfort, shortness of breath and coughing spells. Patient reports significant nonproductive cough over the last 3 days and on the day of admission reports difficulty catching her breath and feeling short of breath. Patient denies any specific chest pain, no nausea, no vomiting, no headaches, no blurred vision, no focal weakness, no dysuria, no hematuria, no melena, no sick contacts or any other complaints. In the ED chest x-ray and CT chest demonstrated right lower lobe pneumonia;patient with WBCs in the 20,000 range) chronically with leukocytosis in the 12-16Krange),patient found to be febrile and experiencing tachypnea with significant right-sided chest discomfort. Cultures were taken, IV fluids initiated and IV antibiotics given. [facility] has been called on the patient for further evaluation and management.  9/9:Patient noted to have significant right-sided chest pain that has been persistent since admission. She does not appear to be responding well to IV Levaquin and will switch to IV Zosyn today after discussion with pulmonology. We will also add IV steroids to assist with symptoms. Will likely require thoracentesis in the outpatient setting. Continue pain medications.  9/10: Worsening leukocytosis as well as lactic acidosis noted this morning.  Clinically patient appears stable.  Fluid bolus initiated for lactic acidosis today and will monitor blood pressures and repeat lactic acid a.m.  Pulmonology consulted with recommendations appreciated to  include reinitiation of Levaquin until Legionella antigen returns.  9/11: Patient continues have persistent leukocytosis as well as increased lactic acidosis this morning.  Clinically she is looking better, however.  Plans to continue current antibiotics with Legionella antigen pending.  Continue on IV fluid at higher rate today.  Assessment & Plan:   Principal Problem:   RLL pneumonia ([place]) Active Problems:   Carotid artery disease ([place])   Hyperlipidemia   GERD (gastroesophageal reflux disease)   Stroke ([facility])   Tobacco abuse   Acute hypoxemic respiratory failure ([facility])   1-acute hypoxemic respiratory failuresecondary toRLL pneumoniawith associated abscess and worsening lactic acidosis -Continue IV cefepime, Flagyl, and Levaquin added 9/10 by pulmonology for Legionella coverage with urine antigen still  pending. -Follow blood cultures, urine culture, strep antigen and Legionella antigen in urine and follow sputum cultures. -IVSolu-Medrol twice daily added on for pleuritic chest pain on 9/9-likely contributing to worsening leukocytosis at this point, but will continue -Flutter valve/incentive spirometer and the use of DuoNeb -Continue IV fluid at 100 cc/h and follow lactic acid in a.m. -follow clinical response -Will require thoracentesis inpatient versus outpatient based on therapeutic response -Provide PRN oxygen supplementation and wean off to room air as tolerated. -Continue pain management with Lidoderm patch.  2-hyperlipidemia -Hold Lovaza and statins with LDL noted to be 28  3-hypothyroidism -Continue Synthroid. -Normal TSH recently checked  4-GERD -will use Pepcid  5-hx of stroke/carotid artery disease -continue aspirin, plavix and statins  -no acute neurologic Deficit reported.  6-tobacco abusewith likely early COPD -cessation counseling provided -Patient declining nicotine patch  7-hypokalemia/hypomagnesemia -Resolved   DVT prophylaxis:Lovenox  Code Status:Full Family Communication:None at bedside Disposition Plan:Continue current treatment and monitor closely. Will need inpatient care due to ongoing need for IVF and IV antibiotics.  Consultants:   Pulmonology  Procedures:   None  Antimicrobials:  Anti-infectives (From admission, onward)   Start     Dose/Rate Route Frequency Ordered Stop   12/22/18 0930  levofloxacin (LEVAQUIN) IVPB 500 mg     500 mg 100 mL/hr over 60 Minutes Intravenous Every 24 hours 12/22/18 0829     12/21/18 1300  ceFEPIme (MAXIPIME) 2 g in sodium chloride 0.9 % 100 mL IVPB     2 g 200 mL/hr over 30 Minutes Intravenous Every 8 hours 12/21/18 1254     12/21/18 1000  ceFEPIme (MAXIPIME) 2 g in sodium chloride 0.9 % 100 mL IVPB  Status:  Discontinued     2 g 200 mL/hr over 30 Minutes Intravenous Every 8 hours 12/21/18 0953 12/21/18 1254   12/21/18 1000  metroNIDAZOLE (FLAGYL) IVPB 500 mg     500 mg 100 mL/hr over 60 Minutes Intravenous Every 8 hours 12/21/18 0953     12/20/18 1353  levofloxacin (LEVAQUIN) IVPB 750 mg  Status:  Discontinued     750 mg 100 mL/hr over 90 Minutes Intravenous Every 24 hours 12/20/18 1353 12/21/18 0810   12/20/18 1330  levofloxacin (LEVAQUIN) IVPB 500 mg  Status:  Discontinued     500 mg 100 mL/hr over 60 Minutes Intravenous  Once 12/20/18 1318 12/20/18 1358       Subjective: Patient seen and evaluated today with no new acute complaints or concerns. No acute concerns or events noted overnight.  She states that her pleuritic chest pain has improved.  She still remains on 4 L nasal cannula and is eager to ambulate.  Objective: Vitals:   12/22/18 0527 12/22/18 1450 12/22/18 1954 12/23/18 0537  BP: 92/61 112/75 124/85 127/71  Pulse: 70 85 92 64  Resp: 20 18 18 17   Temp: 97.7 F (36.5 C) 98.5 F (36.9 C) 98.1 F (36.7 C) 97.6 F (36.4 C)  TempSrc: Oral  Oral   SpO2: 92% 95% 98% 96%  Weight:      Height:        Intake/Output Summary (Last 24 hours) at  12/23/2018 0915 Last data filed at 12/23/2018 0700 Gross per 24 hour  Intake 2631.17 ml  Output 5400 ml  Net -2768.83 ml   Filed Weights   12/20/18 0957 12/20/18 1441  Weight: 49.9 kg 46.2 kg    Examination:  General exam: Appears calm and comfortable  Respiratory system: Clear to auscultation. Respiratory effort normal.  4 L nasal cannula. Cardiovascular system: S1 & S2 heard, RRR. No JVD, murmurs, rubs, gallops or clicks. No pedal edema. Gastrointestinal system: Abdomen is nondistended, soft and nontender. No organomegaly or masses felt. Normal bowel sounds heard. Central nervous system: Alert and oriented. No focal neurological deficits. Extremities: Symmetric 5 x 5 power. Skin: No rashes, lesions or ulcers Psychiatry: Judgement and insight appear normal. Mood & affect appropriate.     Data Reviewed: I have personally reviewed following labs and imaging studies  CBC: Recent Labs  Lab 12/20/18 1117 12/21/18 0517 12/22/18 0614 12/23/18 0633  WBC 20.5* 27.2* 37.1* 38.5*  NEUTROABS 17.9*  --  33.8*  --   HGB 10.8* 9.6* 10.1* 10.0*  HCT 34.4* 30.0* 32.0* 31.7*  MCV 94.8 94.3 96.1 96.4  PLT 663* 634* 699* [id number]   Basic Metabolic Panel: Recent Labs  Lab 12/20/18 1117 12/21/18 0517 12/22/18 0614 12/23/18 0633  NA 136 132* 138 144  K 3.9 3.4* 4.4 4.2  CL 98 97* 107 111  CO2 27 26 22 24   GLUCOSE 112* 85 174* 193*  BUN <5*  8 8 10   CREATININE 0.53 0.62 0.50 0.59  CALCIUM 8.5* 7.6* 8.5* 8.6*  MG 1.4*  --  2.0  --    GFR: Estimated Creatinine Clearance: 62.7 mL/min (by C-G formula based on SCr of 0.59 mg/dL). Liver Function Tests: Recent Labs  Lab 12/20/18 1117  AST 14*  ALT 10  ALKPHOS 92  BILITOT 0.6  PROT 7.5  ALBUMIN 3.0*   No results for input(s): LIPASE, AMYLASE in the last 168 hours. No results for input(s): AMMONIA in the last 168 hours. Coagulation Profile: No results for input(s): INR, PROTIME in the last 168 hours. Cardiac Enzymes: No results  for input(s): CKTOTAL, CKMB, CKMBINDEX, TROPONINI in the last 168 hours. BNP (last 3 results) No results for input(s): PROBNP in the last 8760 hours. HbA1C: No results for input(s): HGBA1C in the last 72 hours. CBG: No results for input(s): GLUCAP in the last 168 hours. Lipid Profile: Recent Labs    12/21/18 0517  CHOL 70  HDL 29*  LDLCALC 28  TRIG 63  CHOLHDL 2.4   Thyroid Function Tests: No results for input(s): TSH, T4TOTAL, FREET4, T3FREE, THYROIDAB in the last 72 hours. Anemia Panel: No results for input(s): VITAMINB12, FOLATE, FERRITIN, TIBC, IRON, RETICCTPCT in the last 72 hours. Sepsis Labs: Recent Labs  Lab 12/21/18 1546 12/22/18 0614 12/23/18 [id number]  LATICACIDVEN 0.9 2.3* 2.9*    Recent Results (from the past 240 hour(s))  SARS Coronavirus 2 [facility] order, Performed in [facility] hospital lab) Nasopharyngeal Nasopharyngeal Swab     Status: None   Collection Time: 12/20/18 10:18 AM   Specimen: Nasopharyngeal Swab  Result Value Ref Range Status   SARS Coronavirus 2 NEGATIVE NEGATIVE Final    Comment: (NOTE) If result is NEGATIVE SARS-CoV-2 target nucleic acids are NOT DETECTED. The SARS-CoV-2 RNA is generally detectable in upper and lower  respiratory specimens during the acute phase of infection. The lowest  concentration of SARS-CoV-2 viral copies this assay can detect is 250  copies / mL. A negative result does not preclude SARS-CoV-2 infection  and should not be used as the sole basis for treatment or other  patient management decisions.  A negative result may occur with  improper specimen collection / handling, submission of specimen other  than nasopharyngeal swab, presence of viral mutation(s) within the  areas targeted by this assay, and inadequate number of viral copies  (<250 copies / mL). A negative result must be combined with clinical  observations, patient history, and epidemiological information. If result is POSITIVE SARS-CoV-2 target  nucleic acids are DETECTED. The SARS-CoV-2 RNA is generally detectable in upper and lower  respiratory specimens dur ing the acute phase of infection.  Positive  results are indicative of active infection with SARS-CoV-2.  Clinical  correlation with patient history and other diagnostic information is  necessary to determine patient infection status.  Positive results do  not rule out bacterial infection or co-infection with other viruses. If result is PRESUMPTIVE POSTIVE SARS-CoV-2 nucleic acids MAY BE PRESENT.   A presumptive positive result was obtained on the submitted specimen  and confirmed on repeat testing.  While 2019 novel coronavirus  (SARS-CoV-2) nucleic acids may be present in the submitted sample  additional confirmatory testing may be necessary for epidemiological  and / or clinical management purposes  to differentiate between  SARS-CoV-2 and other Sarbecovirus currently known to infect humans.  If clinically indicated additional testing with an alternate test  methodology [phone]) is advised. The SARS-CoV-2 RNA is generally  detectable in upper and lower respiratory sp ecimens during the acute  phase of infection. The expected result is Negative. Fact Sheet for Patients:  StrictlyIdeas.no Fact Sheet for Healthcare Providers: BankingDealers.co.za This test is not yet approved or cleared by the [place] FDA and has been authorized for detection and/or diagnosis of SARS-CoV-2 by FDA under an Emergency Use Authorization (EUA).  This EUA will remain in effect (meaning this test can be used) for the duration of the COVID-19 declaration under Section 564(b)(1) of the Act, 21 U.S.C. section 360bbb-3(b)(1), unless the authorization is terminated or revoked sooner. Performed at [facility], [address], [place], [place] [address]   Blood culture (routine x 2)     Status: None (Preliminary result)   Collection Time:  12/20/18  2:08 PM   Specimen: BLOOD RIGHT HAND  Result Value Ref Range Status   Specimen Description BLOOD RIGHT HAND  Final   Special Requests   Final    BOTTLES DRAWN AEROBIC AND ANAEROBIC Blood Culture adequate volume   Culture   Final    NO GROWTH 3 DAYS Performed at [facility], [address] [place], [place] [address]    Report Status PENDING  Incomplete  Blood culture (routine x 2)     Status: None (Preliminary result)   Collection Time: 12/20/18  2:09 PM   Specimen: BLOOD RIGHT HAND  Result Value Ref Range Status   Specimen Description BLOOD RIGHT HAND  Final   Special Requests   Final    BOTTLES DRAWN AEROBIC AND ANAEROBIC Blood Culture adequate volume   Culture   Final    NO GROWTH 3 DAYS Performed at [facility], [address] [place], [place] [address]    Report Status PENDING  Incomplete  MRSA PCR Screening     Status: None   Collection Time: 12/21/18  8:10 AM   Specimen: Nasal Mucosa; Nasopharyngeal  Result Value Ref Range Status   MRSA by PCR NEGATIVE NEGATIVE Final    Comment:        The GeneXpert MRSA Assay (FDA approved for NASAL specimens only), is one component of a comprehensive MRSA colonization surveillance program. It is not intended to diagnose MRSA infection nor to guide or monitor treatment for MRSA infections. Performed at [facility], [address], [place], [place] [address]   Sputum culture     Status: None   Collection Time: 12/22/18 12:36 AM   Specimen: Expectorated Sputum  Result Value Ref Range Status   Specimen Description EXPECTORATED SPUTUM  Final   Special Requests NONE  Final   Sputum evaluation   Final    THIS SPECIMEN IS ACCEPTABLE FOR SPUTUM CULTURE Performed at [facility], [address] [place], [place] [address]    Report Status 12/22/2018 FINAL  Final  Culture, respiratory     Status: None (Preliminary result)   Collection Time: 12/22/18 12:36 AM  Result Value Ref Range Status   Specimen Description   Final     EXPECTORATED SPUTUM Performed at [facility], [address] [place], [place] [address]    Special Requests   Final    NONE Reflexed from [id number] Performed at [facility], [address], [place],  [address]    Gram Stain   Final    NO WBC SEEN NO ORGANISMS SEEN Performed at [facility], [place] [address] [place],  [address]    Culture PENDING  Incomplete   Report Status PENDING  Incomplete  Radiology Studies: No results found.      Scheduled Meds: . aspirin EC  81 mg Oral Daily  . cabergoline  0.25 mg Oral 2 times weekly  . clopidogrel  75 mg Oral Daily  . enoxaparin (LOVENOX) injection  40 mg Subcutaneous Q24H  . estradiol  2 mg Oral QHS  . famotidine  20 mg Oral BID  . feeding supplement (ENSURE ENLIVE)  237 mL Oral BID BM  . guaiFENesin  1,200 mg Oral BID  . levothyroxine  50 mcg Oral Daily  . lidocaine  1 patch Transdermal Q24H  . methylPREDNISolone (SOLU-MEDROL) injection  40 mg Intravenous Q12H  . multivitamin with minerals  1 tablet Oral Daily   Continuous Infusions: . ceFEPime (MAXIPIME) IV 2 g (12/23/18 0645)  . lactated ringers 75 mL/hr at 12/22/18 1050  . levofloxacin (LEVAQUIN) IV 500 mg (12/22/18 1122)  . metronidazole 500 mg (12/23/18 0226)     LOS: 2 days    Time spent: 30 minutes    Pratik Darleen Crocker, DO [facility] Pager [phone]  If 7PM-7AM, please contact night-coverage [email or web address] Password [email or web address] 12/23/2018, 9:15 AM

## 2018-12-23 NOTE — Progress Notes (Signed)
Subjective: She says she feels better and wants to try to get up and walk in the halls.  She is coughing still and still mostly nonproductive.  She has less pleuritic chest pain.  Objective: Vital signs in last 24 hours: Temp:  [97.6 F (36.4 C)-98.5 F (36.9 C)] 97.6 F (36.4 C) (09/11 0537) Pulse Rate:  [64-92] 64 (09/11 0537) Resp:  [17-18] 17 (09/11 0537) BP: (112-127)/(71-85) 127/71 (09/11 0537) SpO2:  [95 %-98 %] 96 % (09/11 0537) Weight change:  Last BM Date: 12/22/18  Intake/Output from previous day: 09/10 0701 - 09/11 0700 In: 2951.2 [P.O.:1670; I.V.:481.2; IV Piggyback:800] Out: 6550 [Urine:6550]  PHYSICAL EXAM General appearance: alert, cooperative and no distress Resp: Rales in right lung Cardio: regular rate and rhythm, S1, S2 normal, no murmur, click, rub or gallop GI: soft, non-tender; bowel sounds normal; no masses,  no organomegaly Extremities: extremities normal, atraumatic, no cyanosis or edema  Lab Results:  Results for orders placed or performed during the hospital encounter of 12/20/18 (from the past 48 hour(s))  Lactic acid, plasma     Status: None   Collection Time: 12/21/18  3:46 PM  Result Value Ref Range   Lactic Acid, Venous 0.9 0.5 - 1.9 mmol/L    Comment: Performed at [facility], [address] [place], [place] [address]  Sputum culture     Status: None   Collection Time: 12/22/18 12:36 AM   Specimen: Expectorated Sputum  Result Value Ref Range   Specimen Description EXPECTORATED SPUTUM    Special Requests NONE    Sputum evaluation      THIS SPECIMEN IS ACCEPTABLE FOR SPUTUM CULTURE Performed at [facility], [address] [place], [place] [address]    Report Status 12/22/2018 FINAL   Culture, respiratory     Status: None (Preliminary result)   Collection Time: 12/22/18 12:36 AM  Result Value Ref Range   Specimen Description      EXPECTORATED SPUTUM Performed at [facility], [address] [place], [place] [address]    Special  Requests      NONE Reflexed from [phone] Performed at [facility], [address], [place], [place] [address]    Gram Stain      NO WBC SEEN NO ORGANISMS SEEN Performed at [facility], [place] [address] [place], [place] [address]    Culture PENDING    Report Status PENDING   Basic metabolic panel     Status: Abnormal   Collection Time: 12/22/18  6:14 AM  Result Value Ref Range   Sodium 138 135 - 145 mmol/L   Potassium 4.4 3.5 - 5.1 mmol/L    Comment: DELTA CHECK NOTED   Chloride 107 98 - 111 mmol/L   CO2 22 22 - 32 mmol/L   Glucose, Bld 174 (H) 70 - 99 mg/dL   BUN 8 6 - 20 mg/dL   Creatinine, Ser 0.50 0.44 - 1.00 mg/dL   Calcium 8.5 (L) 8.9 - 10.3 mg/dL   GFR calc non Af Amer >60 >60 mL/min   GFR calc Af Amer >60 >60 mL/min   Anion gap 9 5 - 15    Comment: Performed at [facility], [address], [place], [place] [address]  Magnesium     Status: None   Collection Time: 12/22/18  6:14 AM  Result Value Ref Range   Magnesium 2.0 1.7 - 2.4 mg/dL    Comment: Performed at [facility], [address], [place], [place] [address]  CBC with  Differential/Platelet     Status: Abnormal   Collection Time: 12/22/18  6:14 AM  Result Value Ref Range   WBC 37.1 (H) 4.0 - 10.5 K/uL   RBC 3.33 (L) 3.87 - 5.11 MIL/uL   Hemoglobin 10.1 (L) 12.0 - 15.0 g/dL   HCT 32.0 (L) 36.0 - 46.0 %   MCV 96.1 80.0 - 100.0 fL   MCH 30.3 26.0 - 34.0 pg   MCHC 31.6 30.0 - 36.0 g/dL   RDW 12.3 11.5 - 15.5 %   Platelets 699 (H) 150 - 400 K/uL   nRBC 0.0 0.0 - 0.2 %   Neutrophils Relative % 91 %   Neutro Abs 33.8 (H) 1.7 - 7.7 K/uL   Lymphocytes Relative 4 %   Lymphs Abs 1.6 0.7 - 4.0 K/uL   Monocytes Relative 3 %   Monocytes Absolute 1.1 (H) 0.1 - 1.0 K/uL   Eosinophils Relative 0 %   Eosinophils Absolute 0.0 0.0 - 0.5 K/uL   Basophils Relative 0 %   Basophils Absolute 0.1 0.0 - 0.1 K/uL   WBC Morphology WHITE CELL COUNT CONFIRMED BY SMEAR    Immature Granulocytes 2 %   Abs Immature Granulocytes  0.60 (H) 0.00 - 0.07 K/uL    Comment: Performed at [facility], [address], [place], [place] [address]  Lactic acid, plasma     Status: Abnormal   Collection Time: 12/22/18  6:14 AM  Result Value Ref Range   Lactic Acid, Venous 2.3 (HH) 0.5 - 1.9 mmol/L    Comment: CRITICAL RESULT CALLED TO, READ BACK BY AND VERIFIED WITH: EVANS,H AT 7:30AM ON 12/22/18 BY [facility] Performed at [facility], [address] [place], [place] [id number]   Basic metabolic panel     Status: Abnormal   Collection Time: 12/23/18  6:33 AM  Result Value Ref Range   Sodium 144 135 - 145 mmol/L   Potassium 4.2 3.5 - 5.1 mmol/L   Chloride 111 98 - 111 mmol/L   CO2 24 22 - 32 mmol/L   Glucose, Bld 193 (H) 70 - 99 mg/dL   BUN 10 6 - 20 mg/dL   Creatinine, Ser 0.59 0.44 - 1.00 mg/dL   Calcium 8.6 (L) 8.9 - 10.3 mg/dL   GFR calc non Af Amer >60 >60 mL/min   GFR calc Af Amer >60 >60 mL/min   Anion gap 9 5 - 15    Comment: Performed at [facility], [address] [place], [place] [address]  CBC     Status: Abnormal   Collection Time: 12/23/18  6:33 AM  Result Value Ref Range   WBC 38.5 (H) 4.0 - 10.5 K/uL   RBC 3.29 (L) 3.87 - 5.11 MIL/uL   Hemoglobin 10.0 (L) 12.0 - 15.0 g/dL   HCT 31.7 (L) 36.0 - 46.0 %   MCV 96.4 80.0 - 100.0 fL   MCH 30.4 26.0 - 34.0 pg   MCHC 31.5 30.0 - 36.0 g/dL   RDW 12.8 11.5 - 15.5 %   Platelets 745 (H) 150 - 400 K/uL   nRBC 0.0 0.0 - 0.2 %    Comment: Performed at [facility], [address] [place], [place] [address]  Lactic acid, plasma     Status: Abnormal   Collection Time: 12/23/18  6:33 AM  Result Value Ref Range   Lactic Acid, Venous 2.9 (HH) 0.5 - 1.9 mmol/L    Comment: CRITICAL RESULT CALLED TO, READ BACK BY AND VERIFIED WITH: DARLING,J AT 8:00AM ON  12/23/18 BY Derrill Memo Performed at [facility], [address], [place], [place] [address]     ABGS No results for input(s): PHART, PO2ART, TCO2, HCO3 in the last 72 hours.  Invalid input(s):  PCO2 CULTURES Recent Results (from the past 240 hour(s))  SARS Coronavirus 2 [facility] order, Performed in [facility] hospital lab) Nasopharyngeal Nasopharyngeal Swab     Status: None   Collection Time: 12/20/18 10:18 AM   Specimen: Nasopharyngeal Swab  Result Value Ref Range Status   SARS Coronavirus 2 NEGATIVE NEGATIVE Final    Comment: (NOTE) If result is NEGATIVE SARS-CoV-2 target nucleic acids are NOT DETECTED. The SARS-CoV-2 RNA is generally detectable in upper and lower  respiratory specimens during the acute phase of infection. The lowest  concentration of SARS-CoV-2 viral copies this assay can detect is 250  copies / mL. A negative result does not preclude SARS-CoV-2 infection  and should not be used as the sole basis for treatment or other  patient management decisions.  A negative result may occur with  improper specimen collection / handling, submission of specimen other  than nasopharyngeal swab, presence of viral mutation(s) within the  areas targeted by this assay, and inadequate number of viral copies  (<250 copies / mL). A negative result must be combined with clinical  observations, patient history, and epidemiological information. If result is POSITIVE SARS-CoV-2 target nucleic acids are DETECTED. The SARS-CoV-2 RNA is generally detectable in upper and lower  respiratory specimens dur ing the acute phase of infection.  Positive  results are indicative of active infection with SARS-CoV-2.  Clinical  correlation with patient history and other diagnostic information is  necessary to determine patient infection status.  Positive results do  not rule out bacterial infection or co-infection with other viruses. If result is PRESUMPTIVE POSTIVE SARS-CoV-2 nucleic acids MAY BE PRESENT.   A presumptive positive result was obtained on the submitted specimen  and confirmed on repeat testing.  While 2019 novel coronavirus  (SARS-CoV-2) nucleic acids may be present in the  submitted sample  additional confirmatory testing may be necessary for epidemiological  and / or clinical management purposes  to differentiate between  SARS-CoV-2 and other Sarbecovirus currently known to infect humans.  If clinically indicated additional testing with an alternate test  methodology [phone]) is advised. The SARS-CoV-2 RNA is generally  detectable in upper and lower respiratory sp ecimens during the acute  phase of infection. The expected result is Negative. Fact Sheet for Patients:  StrictlyIdeas.no Fact Sheet for Healthcare Providers: BankingDealers.co.za This test is not yet approved or cleared by the [place] FDA and has been authorized for detection and/or diagnosis of SARS-CoV-2 by FDA under an Emergency Use Authorization (EUA).  This EUA will remain in effect (meaning this test can be used) for the duration of the COVID-19 declaration under Section 564(b)(1) of the Act, 21 U.S.C. section 360bbb-3(b)(1), unless the authorization is terminated or revoked sooner. Performed at [facility], [address] [place], [place] [address]   Blood culture (routine x 2)     Status: None (Preliminary result)   Collection Time: 12/20/18  2:08 PM   Specimen: BLOOD RIGHT HAND  Result Value Ref Range Status   Specimen Description BLOOD RIGHT HAND  Final   Special Requests   Final    BOTTLES DRAWN AEROBIC AND ANAEROBIC Blood Culture adequate volume   Culture   Final    NO GROWTH 3 DAYS Performed at [facility], [address] [place], [place]  [address]    Report Status PENDING  Incomplete  Blood culture (routine x 2)     Status: None (Preliminary result)   Collection Time: 12/20/18  2:09 PM   Specimen: BLOOD RIGHT HAND  Result Value Ref Range Status   Specimen Description BLOOD RIGHT HAND  Final   Special Requests   Final    BOTTLES DRAWN AEROBIC AND ANAEROBIC Blood Culture adequate volume   Culture   Final    NO  GROWTH 3 DAYS Performed at [facility], [address] [place], [place] [address]    Report Status PENDING  Incomplete  MRSA PCR Screening     Status: None   Collection Time: 12/21/18  8:10 AM   Specimen: Nasal Mucosa; Nasopharyngeal  Result Value Ref Range Status   MRSA by PCR NEGATIVE NEGATIVE Final    Comment:        The GeneXpert MRSA Assay (FDA approved for NASAL specimens only), is one component of a comprehensive MRSA colonization surveillance program. It is not intended to diagnose MRSA infection nor to guide or monitor treatment for MRSA infections. Performed at [facility], [address], [place], [place] [address]   Sputum culture     Status: None   Collection Time: 12/22/18 12:36 AM   Specimen: Expectorated Sputum  Result Value Ref Range Status   Specimen Description EXPECTORATED SPUTUM  Final   Special Requests NONE  Final   Sputum evaluation   Final    THIS SPECIMEN IS ACCEPTABLE FOR SPUTUM CULTURE Performed at [facility], [address] [place], [place] [address]    Report Status 12/22/2018 FINAL  Final  Culture, respiratory     Status: None (Preliminary result)   Collection Time: 12/22/18 12:36 AM  Result Value Ref Range Status   Specimen Description   Final    EXPECTORATED SPUTUM Performed at [facility], [address], [place], [place] [address]    Special Requests   Final    NONE Reflexed from [phone] Performed at [facility], [address] [place], [place] [address]    Gram Stain   Final    NO WBC SEEN NO ORGANISMS SEEN Performed at [facility], [place] [address] [place], [place] [address]    Culture PENDING  Incomplete   Report Status PENDING  Incomplete   Studies/Results: No results found.  Medications:  Prior to Admission:  Medications Prior to Admission  Medication Sig Dispense Refill Last Dose  . aspirin EC 81 MG tablet Take 81 mg by mouth daily.   12/19/2018 at 900  . cabergoline (DOSTINEX) 0.5 MG tablet Take 0.5 tablets  (0.25 mg total) by mouth 2 (two) times a week. 10 tablet 2 12/19/2018 at Unknown time  . clopidogrel (PLAVIX) 75 MG tablet Take 1 tablet (75 mg total) by mouth daily. 30 tablet 0 12/19/2018 at 900  . estradiol (ESTRACE) 2 MG tablet TAKE 1 TABLET BY MOUTH ONCE DAILY. 30 tablet 11 12/19/2018 at Unknown time  . Icosapent Ethyl (VASCEPA) 1 G CAPS Take 2 g by mouth 2 (two) times daily.    12/19/2018 at Unknown time  . lansoprazole (PREVACID) 30 MG capsule TAKE 1 CAPSULE IN THE MORNING (1 HOUR BEFORE BREAKFAST) 30 capsule 5 12/19/2018 at Unknown time  . levothyroxine (SYNTHROID) 50 MCG tablet Take 1 tablet (50 mcg total) by mouth daily. 90 tablet 1 12/19/2018 at Unknown time  . Multiple Vitamin (MULTIVITAMIN) tablet Take 1 tablet by mouth daily.   12/19/2018 at Unknown time  .  rosuvastatin (CRESTOR) 20 MG tablet Take 20 mg by mouth daily.   12/19/2018 at Unknown time  . naproxen sodium (ALEVE) 220 MG tablet Take 220 mg by mouth daily as needed (pain).     . ranitidine (ZANTAC) 300 MG tablet TAKE ONE TABLET BY MOUTH AT BEDTIME. (Patient not taking: Reported on 12/20/2018) 30 tablet 5 Not Taking at Unknown time   Scheduled: . aspirin EC  81 mg Oral Daily  . cabergoline  0.25 mg Oral 2 times weekly  . clopidogrel  75 mg Oral Daily  . enoxaparin (LOVENOX) injection  40 mg Subcutaneous Q24H  . estradiol  2 mg Oral QHS  . famotidine  20 mg Oral BID  . feeding supplement (ENSURE ENLIVE)  237 mL Oral BID BM  . guaiFENesin  1,200 mg Oral BID  . levothyroxine  50 mcg Oral Daily  . lidocaine  1 patch Transdermal Q24H  . methylPREDNISolone (SOLU-MEDROL) injection  40 mg Intravenous Q12H  . multivitamin with minerals  1 tablet Oral Daily   Continuous: . ceFEPime (MAXIPIME) IV 2 g (12/23/18 0645)  . lactated ringers 75 mL/hr at 12/22/18 1050  . levofloxacin (LEVAQUIN) IV 500 mg (12/22/18 1122)  . metronidazole 500 mg (12/23/18 0226)   [id number], alum & mag hydroxide-simeth, HYDROmorphone (DILAUDID) injection,  ipratropium-albuterol, ketorolac, sodium chloride  Assesment: She was admitted with right lower lobe pneumonia and has cavitary lesion.  She has acute hypoxic respiratory failure and she is still requiring oxygen.  She is improving although her white blood count is still greater than 30,000 and she still has elevation of lactate clinically she looks better.  She has COPD at baseline had been smoking up until admission. Principal Problem:   RLL pneumonia ([place]) Active Problems:   Carotid artery disease (HCC)   Hyperlipidemia   GERD (gastroesophageal reflux disease)   Stroke (HCC)   Tobacco abuse   Acute hypoxemic respiratory failure ([place])    Plan: Continue current treatments.  She does seem to be improving clinically but her lab work is lagging behind    LOS: 2 days   Alonza Bogus 12/23/2018, 8:44 AM

## 2018-12-24 LAB — CBC
HCT: 29.5 % — ABNORMAL LOW (ref 36.0–46.0)
Hemoglobin: 9.3 g/dL — ABNORMAL LOW (ref 12.0–15.0)
MCH: 29.9 pg (ref 26.0–34.0)
MCHC: 31.5 g/dL (ref 30.0–36.0)
MCV: 94.9 fL (ref 80.0–100.0)
Platelets: 679 10*3/uL — ABNORMAL HIGH (ref 150–400)
RBC: 3.11 MIL/uL — ABNORMAL LOW (ref 3.87–5.11)
RDW: 13 % (ref 11.5–15.5)
WBC: 24.2 10*3/uL — ABNORMAL HIGH (ref 4.0–10.5)
nRBC: 0 % (ref 0.0–0.2)

## 2018-12-24 LAB — BASIC METABOLIC PANEL
Anion gap: 8 (ref 5–15)
BUN: 12 mg/dL (ref 6–20)
CO2: 26 mmol/L (ref 22–32)
Calcium: 8.5 mg/dL — ABNORMAL LOW (ref 8.9–10.3)
Chloride: 110 mmol/L (ref 98–111)
Creatinine, Ser: 0.52 mg/dL (ref 0.44–1.00)
GFR calc Af Amer: >60 mL/min (ref >60)
GFR calc non Af Amer: >60 mL/min (ref >60)
Glucose, Bld: 152 mg/dL — ABNORMAL HIGH (ref 70–99)
Potassium: 3.9 mmol/L (ref 3.5–5.1)
Sodium: 144 mmol/L (ref 135–145)

## 2018-12-24 LAB — CULTURE, RESPIRATORY W GRAM STAIN
Culture: NORMAL
Gram Stain: NONE SEEN

## 2018-12-24 LAB — LACTIC ACID, PLASMA: Lactic Acid, Venous: 0.9 mmol/L (ref 0.5–1.9)

## 2018-12-24 MED ORDER — PREDNISONE 20 MG PO TABS
20.0000 mg | ORAL_TABLET | Freq: Every day | ORAL | Status: DC
  Administered 2018-12-24: 20 mg via ORAL
  Filled 2018-12-24: qty 1

## 2018-12-24 MED ORDER — DIPHENHYDRAMINE HCL 25 MG PO CAPS
25.0000 mg | ORAL_CAPSULE | Freq: Once | ORAL | Status: AC
  Administered 2018-12-24: 22:00:00 25 mg via ORAL
  Filled 2018-12-24: qty 1

## 2018-12-24 MED ORDER — SODIUM CHLORIDE 0.9 % IV SOLN
INTRAVENOUS | Status: DC | PRN
  Administered 2018-12-24: 250 mL via INTRAVENOUS

## 2018-12-24 NOTE — Progress Notes (Signed)
Subjective: She says she feels better.  She was able to walk in the [place] yesterday.  She is a little bit sore from that but otherwise doing well.  No new complaints.  She has been able to cough up some sputum.  Objective: Vital signs in last 24 hours: Temp:  [97.8 F (36.6 C)-98.2 F (36.8 C)] 98.2 F (36.8 C) (09/12 0515) Pulse Rate:  [63-76] 63 (09/12 0515) Resp:  [16-20] 18 (09/12 0515) BP: (124-132)/(76-82) 132/77 (09/12 0515) SpO2:  [91 %-100 %] 95 % (09/12 0515) Weight change:  Last BM Date: 12/22/18  Intake/Output from previous day: 09/11 0701 - 09/12 0700 In: 1080 [P.O.:1080] Out: 1600 [Urine:1600]  PHYSICAL EXAM General appearance: alert, cooperative and no distress Resp: She still has some rales on the right but she is substantially clearer than when I first listened to her Cardio: regular rate and rhythm, S1, S2 normal, no murmur, click, rub or gallop GI: soft, non-tender; bowel sounds normal; no masses,  no organomegaly Extremities: extremities normal, atraumatic, no cyanosis or edema  Lab Results:  Results for orders placed or performed during the hospital encounter of 12/20/18 (from the past 48 hour(s))  Basic metabolic panel     Status: Abnormal   Collection Time: 12/23/18  6:33 AM  Result Value Ref Range   Sodium 144 135 - 145 mmol/L   Potassium 4.2 3.5 - 5.1 mmol/L   Chloride 111 98 - 111 mmol/L   CO2 24 22 - 32 mmol/L   Glucose, Bld 193 (H) 70 - 99 mg/dL   BUN 10 6 - 20 mg/dL   Creatinine, Ser 0.59 0.44 - 1.00 mg/dL   Calcium 8.6 (L) 8.9 - 10.3 mg/dL   GFR calc non Af Amer >60 >60 mL/min   GFR calc Af Amer >60 >60 mL/min   Anion gap 9 5 - 15    Comment: Performed at [facility], [address] [place], [place] [address]  CBC     Status: Abnormal   Collection Time: 12/23/18  6:33 AM  Result Value Ref Range   WBC 38.5 (H) 4.0 - 10.5 K/uL   RBC 3.29 (L) 3.87 - 5.11 MIL/uL   Hemoglobin 10.0 (L) 12.0 - 15.0 g/dL   HCT 31.7 (L) 36.0 - 46.0 %   MCV  96.4 80.0 - 100.0 fL   MCH 30.4 26.0 - 34.0 pg   MCHC 31.5 30.0 - 36.0 g/dL   RDW 12.8 11.5 - 15.5 %   Platelets 745 (H) 150 - 400 K/uL   nRBC 0.0 0.0 - 0.2 %    Comment: Performed at [facility], [address], [place], [place] [address]  Lactic acid, plasma     Status: Abnormal   Collection Time: 12/23/18  6:33 AM  Result Value Ref Range   Lactic Acid, Venous 2.9 (HH) 0.5 - 1.9 mmol/L    Comment: CRITICAL RESULT CALLED TO, READ BACK BY AND VERIFIED WITH: DARLING,J AT 8:00AM ON 12/23/18 BY [facility] Performed at [facility], [address], [place], [place] [address]   CBC     Status: Abnormal   Collection Time: 12/24/18  6:23 AM  Result Value Ref Range   WBC 24.2 (H) 4.0 - 10.5 K/uL   RBC 3.11 (L) 3.87 - 5.11 MIL/uL   Hemoglobin 9.3 (L) 12.0 - 15.0 g/dL   HCT 29.5 (L) 36.0 - 46.0 %   MCV 94.9 80.0 - 100.0 fL   MCH 29.9 26.0 - 34.0 pg   MCHC 31.5  30.0 - 36.0 g/dL   RDW 13.0 11.5 - 15.5 %   Platelets 679 (H) 150 - 400 K/uL   nRBC 0.0 0.0 - 0.2 %    Comment: Performed at [facility], [address], [place], [place] [id number]  Basic metabolic panel     Status: Abnormal   Collection Time: 12/24/18  6:23 AM  Result Value Ref Range   Sodium 144 135 - 145 mmol/L   Potassium 3.9 3.5 - 5.1 mmol/L   Chloride 110 98 - 111 mmol/L   CO2 26 22 - 32 mmol/L   Glucose, Bld 152 (H) 70 - 99 mg/dL   BUN 12 6 - 20 mg/dL   Creatinine, Ser 0.52 0.44 - 1.00 mg/dL   Calcium 8.5 (L) 8.9 - 10.3 mg/dL   GFR calc non Af Amer >60 >60 mL/min   GFR calc Af Amer >60 >60 mL/min   Anion gap 8 5 - 15    Comment: Performed at [facility], [address] [place], [place] [address]  Lactic acid, plasma     Status: None   Collection Time: 12/24/18  6:23 AM  Result Value Ref Range   Lactic Acid, Venous 0.9 0.5 - 1.9 mmol/L    Comment: Performed at [facility], [address], [place], [place] [address]    ABGS No results for input(s): PHART, PO2ART, TCO2, HCO3 in the last 72 hours.  Invalid  input(s): PCO2 CULTURES Recent Results (from the past 240 hour(s))  SARS Coronavirus 2 [facility] order, Performed in [facility] hospital lab) Nasopharyngeal Nasopharyngeal Swab     Status: None   Collection Time: 12/20/18 10:18 AM   Specimen: Nasopharyngeal Swab  Result Value Ref Range Status   SARS Coronavirus 2 NEGATIVE NEGATIVE Final    Comment: (NOTE) If result is NEGATIVE SARS-CoV-2 target nucleic acids are NOT DETECTED. The SARS-CoV-2 RNA is generally detectable in upper and lower  respiratory specimens during the acute phase of infection. The lowest  concentration of SARS-CoV-2 viral copies this assay can detect is 250  copies / mL. A negative result does not preclude SARS-CoV-2 infection  and should not be used as the sole basis for treatment or other  patient management decisions.  A negative result may occur with  improper specimen collection / handling, submission of specimen other  than nasopharyngeal swab, presence of viral mutation(s) within the  areas targeted by this assay, and inadequate number of viral copies  (<250 copies / mL). A negative result must be combined with clinical  observations, patient history, and epidemiological information. If result is POSITIVE SARS-CoV-2 target nucleic acids are DETECTED. The SARS-CoV-2 RNA is generally detectable in upper and lower  respiratory specimens dur ing the acute phase of infection.  Positive  results are indicative of active infection with SARS-CoV-2.  Clinical  correlation with patient history and other diagnostic information is  necessary to determine patient infection status.  Positive results do  not rule out bacterial infection or co-infection with other viruses. If result is PRESUMPTIVE POSTIVE SARS-CoV-2 nucleic acids MAY BE PRESENT.   A presumptive positive result was obtained on the submitted specimen  and confirmed on repeat testing.  While 2019 novel coronavirus  (SARS-CoV-2) nucleic acids may be present  in the submitted sample  additional confirmatory testing may be necessary for epidemiological  and / or clinical management purposes  to differentiate between  SARS-CoV-2 and other Sarbecovirus currently known to infect humans.  If clinically indicated additional testing with an alternate  test  methodology [phone]) is advised. The SARS-CoV-2 RNA is generally  detectable in upper and lower respiratory sp ecimens during the acute  phase of infection. The expected result is Negative. Fact Sheet for Patients:  StrictlyIdeas.no Fact Sheet for Healthcare Providers: BankingDealers.co.za This test is not yet approved or cleared by the [place] FDA and has been authorized for detection and/or diagnosis of SARS-CoV-2 by FDA under an Emergency Use Authorization (EUA).  This EUA will remain in effect (meaning this test can be used) for the duration of the COVID-19 declaration under Section 564(b)(1) of the Act, 21 U.S.C. section 360bbb-3(b)(1), unless the authorization is terminated or revoked sooner. Performed at [facility], [address] [place], [place] [address]   Blood culture (routine x 2)     Status: None (Preliminary result)   Collection Time: 12/20/18  2:08 PM   Specimen: BLOOD RIGHT HAND  Result Value Ref Range Status   Specimen Description BLOOD RIGHT HAND  Final   Special Requests   Final    BOTTLES DRAWN AEROBIC AND ANAEROBIC Blood Culture adequate volume   Culture   Final    NO GROWTH 3 DAYS Performed at [facility], [address], [place], [place] [address]    Report Status PENDING  Incomplete  Blood culture (routine x 2)     Status: None (Preliminary result)   Collection Time: 12/20/18  2:09 PM   Specimen: BLOOD RIGHT HAND  Result Value Ref Range Status   Specimen Description BLOOD RIGHT HAND  Final   Special Requests   Final    BOTTLES DRAWN AEROBIC AND ANAEROBIC Blood Culture adequate volume   Culture   Final     NO GROWTH 3 DAYS Performed at [facility], [address] [place], [place] [address]    Report Status PENDING  Incomplete  MRSA PCR Screening     Status: None   Collection Time: 12/21/18  8:10 AM   Specimen: Nasal Mucosa; Nasopharyngeal  Result Value Ref Range Status   MRSA by PCR NEGATIVE NEGATIVE Final    Comment:        The GeneXpert MRSA Assay (FDA approved for NASAL specimens only), is one component of a comprehensive MRSA colonization surveillance program. It is not intended to diagnose MRSA infection nor to guide or monitor treatment for MRSA infections. Performed at [facility], [address], [place], [place] [address]   Sputum culture     Status: None   Collection Time: 12/22/18 12:36 AM   Specimen: Expectorated Sputum  Result Value Ref Range Status   Specimen Description EXPECTORATED SPUTUM  Final   Special Requests NONE  Final   Sputum evaluation   Final    THIS SPECIMEN IS ACCEPTABLE FOR SPUTUM CULTURE Performed at [facility], [address] [place], [place] [address]    Report Status 12/22/2018 FINAL  Final  Culture, respiratory     Status: None (Preliminary result)   Collection Time: 12/22/18 12:36 AM  Result Value Ref Range Status   Specimen Description   Final    EXPECTORATED SPUTUM Performed at [facility], [address], [place], [place] [address]    Special Requests   Final    NONE Reflexed from [id number] Performed at [facility], [address], [place], [place] [address]    Gram Stain NO WBC SEEN NO ORGANISMS SEEN   Final   Culture   Final    CULTURE REINCUBATED FOR BETTER GROWTH Performed at [facility], [place]  [address], [place], [place] [address]    Report Status PENDING  Incomplete   Studies/Results: No results found.  Medications:  Prior to Admission:  Medications Prior to Admission  Medication Sig Dispense Refill Last Dose  . aspirin EC 81 MG tablet Take 81 mg by mouth daily.   12/19/2018 at 900  . cabergoline (DOSTINEX) 0.5  MG tablet Take 0.5 tablets (0.25 mg total) by mouth 2 (two) times a week. 10 tablet 2 12/19/2018 at Unknown time  . clopidogrel (PLAVIX) 75 MG tablet Take 1 tablet (75 mg total) by mouth daily. 30 tablet 0 12/19/2018 at 900  . estradiol (ESTRACE) 2 MG tablet TAKE 1 TABLET BY MOUTH ONCE DAILY. 30 tablet 11 12/19/2018 at Unknown time  . Icosapent Ethyl (VASCEPA) 1 G CAPS Take 2 g by mouth 2 (two) times daily.    12/19/2018 at Unknown time  . lansoprazole (PREVACID) 30 MG capsule TAKE 1 CAPSULE IN THE MORNING (1 HOUR BEFORE BREAKFAST) 30 capsule 5 12/19/2018 at Unknown time  . levothyroxine (SYNTHROID) 50 MCG tablet Take 1 tablet (50 mcg total) by mouth daily. 90 tablet 1 12/19/2018 at Unknown time  . Multiple Vitamin (MULTIVITAMIN) tablet Take 1 tablet by mouth daily.   12/19/2018 at Unknown time  . rosuvastatin (CRESTOR) 20 MG tablet Take 20 mg by mouth daily.   12/19/2018 at Unknown time  . naproxen sodium (ALEVE) 220 MG tablet Take 220 mg by mouth daily as needed (pain).     . ranitidine (ZANTAC) 300 MG tablet TAKE ONE TABLET BY MOUTH AT BEDTIME. (Patient not taking: Reported on 12/20/2018) 30 tablet 5 Not Taking at Unknown time   Scheduled: . aspirin EC  81 mg Oral Daily  . cabergoline  0.25 mg Oral 2 times weekly  . clopidogrel  75 mg Oral Daily  . enoxaparin (LOVENOX) injection  40 mg Subcutaneous Q24H  . estradiol  2 mg Oral QHS  . famotidine  20 mg Oral BID  . feeding supplement (ENSURE ENLIVE)  237 mL Oral BID BM  . guaiFENesin  1,200 mg Oral BID  . levothyroxine  50 mcg Oral Daily  . lidocaine  1 patch Transdermal Q24H  . multivitamin with minerals  1 tablet Oral Daily  . predniSONE  20 mg Oral Q breakfast   Continuous: . ceFEPime (MAXIPIME) IV 2 g (12/24/18 [id number])  . lactated ringers 100 mL/hr at 12/23/18 1106  . metronidazole 500 mg (12/24/18 0221)   [id number], alum & mag hydroxide-simeth, HYDROmorphone (DILAUDID) injection, ipratropium-albuterol, ketorolac, sodium chloride  Assesment:  She has multilobar pneumonia with a cavity.  She is clinically improving and today her white blood cell count and lactate level are much better.  Legionella antigen was negative.  She has COPD at baseline and she is doing pretty well with that.  She complains of insomnia from steroids  She has acute hypoxic respiratory failure and is still using oxygen  She has leukocytosis and elevated platelet count at baseline. Principal Problem:   RLL pneumonia ([place]) Active Problems:   Carotid artery disease (HCC)   Hyperlipidemia   GERD (gastroesophageal reflux disease)   Stroke (HCC)   Tobacco abuse   Acute hypoxemic respiratory failure ([place])    Plan: Discontinue Levaquin.  Switch to oral steroids.  Continue other treatments.  Discussed with Dr. Manuella Ghazi    LOS: 3 days   Alonza Bogus 12/24/2018, 8:45 AM

## 2018-12-24 NOTE — Progress Notes (Signed)
PROGRESS NOTE    [name]  [id number] DOB: 03-05-1971 DOA: 12/20/2018 PCP: Sander Radon, NP   Brief Narrative:  Per HPI: [name] a 48 y.o.femalewith past medical history significant for gastroesophageal reflux disease, hyperlipidemia, tobacco abuse, hypothyroidism,carotid artery disease and previous stroke (without significant residual deficit);who presented to the emergency department secondary to right-sided chest discomfort, shortness of breath and coughing spells. Patient reports significant nonproductive cough over the last 3 days and on the day of admission reports difficulty catching her breath and feeling short of breath. Patient denies any specific chest pain, no nausea, no vomiting, no headaches, no blurred vision, no focal weakness, no dysuria, no hematuria, no melena, no sick contacts or any other complaints. In the ED chest x-ray and CT chest demonstrated right lower lobe pneumonia;patient with WBCs in the 20,000 range) chronically with leukocytosis in the 12-16Krange),patient found to be febrile and experiencing tachypnea with significant right-sided chest discomfort. Cultures were taken, IV fluids initiated and IV antibiotics given. [facility] has been called on the patient for further evaluation and management.  9/9:Patient noted to have significant right-sided chest pain that has been persistent since admission. She does not appear to be responding well to IV Levaquin and will switch to IV Zosyn today after discussion with pulmonology. We will also add IV steroids to assist with symptoms. Will likely require thoracentesis in the outpatient setting. Continue pain medications.  9/10: Worsening leukocytosis as well as lactic acidosis noted this morning. Clinically patient appears stable. Fluid bolus initiated for lactic acidosis today and will monitor blood pressures and repeat lactic acid a.m. Pulmonology consulted with recommendations appreciated to  include reinitiation of Levaquin until Legionella antigen returns.  9/11: Patient continues have persistent leukocytosis as well as increased lactic acidosis this morning.  Clinically she is looking better, however.  Plans to continue current antibiotics with Legionella antigen pending.  Continue on IV fluid at higher rate today.  9/12: Laboratory data much improved this morning with lactic acidosis resolved.  Patient is clinically feeling better and will attempt to wean oxygen.  Legionella antigen negative and Levaquin will be discontinued.  Transition to oral antibiotics today.  Discontinue IV fluid.  Continue to monitor a.m. labs and maintain on cefepime and Flagyl.  Assessment & Plan:   Principal Problem:   RLL pneumonia ([place]) Active Problems:   Carotid artery disease ([place])   Hyperlipidemia   GERD (gastroesophageal reflux disease)   Stroke ([place])   Tobacco abuse   Acute hypoxemic respiratory failure ([place])   1-acute hypoxemic respiratory failuresecondary toRLL pneumoniawith associated abscessand worsening lactic acidosis -Continue IV cefepime and Flagyl for now with Levaquin discontinued on 9/12 as Legionella antigen negative. -Blood cultures and urine culture with no growth thus far. -IVSolu-Medrol switched to oral prednisone by pulmonology today -Flutter valve/incentive spirometer and the use of DuoNeb -DC IV fluid today and follow lactic acid in a.m. -follow clinical response -Will require thoracentesis inpatient versus outpatient based on therapeutic response -Provide PRN oxygen supplementation and wean off to room air as tolerated. -Continue pain management with Lidoderm patch.  2-hyperlipidemia -Hold Lovaza and statins with LDL noted to be 28  3-hypothyroidism -Continue Synthroid. -Normal TSH recently checked  4-GERD -will use Pepcid  5-hx of stroke/carotid artery disease -continue aspirin, plavix and statins  -no acute neurologic Deficit  reported.  6-tobacco abusewith likely early COPD -cessation counseling provided -Patient declining nicotine patch  7-hypokalemia/hypomagnesemia -Resolved   DVT prophylaxis:Lovenox Code Status:Full Family Communication:None at bedside Disposition Plan:Continue current  treatment and monitor closely. Will need inpatient care due to ongoing need for IVF and IV antibiotics.  Anticipate discharge hopefully in the next 48 hours if improved.  Consultants:  Pulmonology  Procedures:  None  Antimicrobials:  Anti-infectives (From admission, onward)   Start     Dose/Rate Route Frequency Ordered Stop   12/22/18 0930  levofloxacin (LEVAQUIN) IVPB 500 mg  Status:  Discontinued     500 mg 100 mL/hr over 60 Minutes Intravenous Every 24 hours 12/22/18 0829 12/24/18 0755   12/21/18 1300  ceFEPIme (MAXIPIME) 2 g in sodium chloride 0.9 % 100 mL IVPB     2 g 200 mL/hr over 30 Minutes Intravenous Every 8 hours 12/21/18 1254     12/21/18 1000  ceFEPIme (MAXIPIME) 2 g in sodium chloride 0.9 % 100 mL IVPB  Status:  Discontinued     2 g 200 mL/hr over 30 Minutes Intravenous Every 8 hours 12/21/18 0953 12/21/18 1254   12/21/18 1000  metroNIDAZOLE (FLAGYL) IVPB 500 mg     500 mg 100 mL/hr over 60 Minutes Intravenous Every 8 hours 12/21/18 0953     12/20/18 1353  levofloxacin (LEVAQUIN) IVPB 750 mg  Status:  Discontinued     750 mg 100 mL/hr over 90 Minutes Intravenous Every 24 hours 12/20/18 1353 12/21/18 0810   12/20/18 1330  levofloxacin (LEVAQUIN) IVPB 500 mg  Status:  Discontinued     500 mg 100 mL/hr over 60 Minutes Intravenous  Once 12/20/18 1318 12/20/18 1358       Subjective: Patient seen and evaluated today with no new acute complaints or concerns. No acute concerns or events noted overnight.  She states that her right chest wall pain is much improved.  She continues to cough with increased sputum production noted.  Objective: Vitals:   12/23/18 1450 12/23/18 1924 12/23/18  2247 12/24/18 0515  BP: 124/76  129/82 132/77  Pulse: 64 76 69 63  Resp: 20 16 18 18   Temp: 97.8 F (36.6 C)  97.9 F (36.6 C) 98.2 F (36.8 C)  TempSrc: Oral     SpO2: 91% 94% 100% 95%  Weight:      Height:        Intake/Output Summary (Last 24 hours) at 12/24/2018 1157 Last data filed at 12/24/2018 0241 Gross per 24 hour  Intake 720 ml  Output 1600 ml  Net -880 ml   Filed Weights   12/20/18 0957 12/20/18 1441  Weight: 49.9 kg 46.2 kg    Examination:  General exam: Appears calm and comfortable  Respiratory system: Clear to auscultation. Respiratory effort normal.  Currently on 4 L nasal cannula. Cardiovascular system: S1 & S2 heard, RRR. No JVD, murmurs, rubs, gallops or clicks. No pedal edema. Gastrointestinal system: Abdomen is nondistended, soft and nontender. No organomegaly or masses felt. Normal bowel sounds heard. Central nervous system: Alert and oriented. No focal neurological deficits. Extremities: Symmetric 5 x 5 power. Skin: No rashes, lesions or ulcers Psychiatry: Judgement and insight appear normal. Mood & affect appropriate.     Data Reviewed: I have personally reviewed following labs and imaging studies  CBC: Recent Labs  Lab 12/20/18 1117 12/21/18 0517 12/22/18 0614 12/23/18 0633 12/24/18 0623  WBC 20.5* 27.2* 37.1* 38.5* 24.2*  NEUTROABS 17.9*  --  33.8*  --   --   HGB 10.8* 9.6* 10.1* 10.0* 9.3*  HCT 34.4* 30.0* 32.0* 31.7* 29.5*  MCV 94.8 94.3 96.1 96.4 94.9  PLT 663* 634* 699* 745* 679*  Basic Metabolic Panel: Recent Labs  Lab 12/20/18 1117 12/21/18 0517 12/22/18 0614 12/23/18 0633 12/24/18 0623  NA 136 132* 138 144 144  K 3.9 3.4* 4.4 4.2 3.9  CL 98 97* 107 111 110  CO2 27 26 22 24 26   GLUCOSE 112* 85 174* 193* 152*  BUN <5* 8 8 10 12   CREATININE 0.53 0.62 0.50 0.59 0.52  CALCIUM 8.5* 7.6* 8.5* 8.6* 8.5*  MG 1.4*  --  2.0  --   --    GFR: Estimated Creatinine Clearance: 62.7 mL/min (by C-G formula based on SCr of 0.52  mg/dL). Liver Function Tests: Recent Labs  Lab 12/20/18 1117  AST 14*  ALT 10  ALKPHOS 92  BILITOT 0.6  PROT 7.5  ALBUMIN 3.0*   No results for input(s): LIPASE, AMYLASE in the last 168 hours. No results for input(s): AMMONIA in the last 168 hours. Coagulation Profile: No results for input(s): INR, PROTIME in the last 168 hours. Cardiac Enzymes: No results for input(s): CKTOTAL, CKMB, CKMBINDEX, TROPONINI in the last 168 hours. BNP (last 3 results) No results for input(s): PROBNP in the last 8760 hours. HbA1C: No results for input(s): HGBA1C in the last 72 hours. CBG: No results for input(s): GLUCAP in the last 168 hours. Lipid Profile: No results for input(s): CHOL, HDL, LDLCALC, TRIG, CHOLHDL, LDLDIRECT in the last 72 hours. Thyroid Function Tests: No results for input(s): TSH, T4TOTAL, FREET4, T3FREE, THYROIDAB in the last 72 hours. Anemia Panel: No results for input(s): VITAMINB12, FOLATE, FERRITIN, TIBC, IRON, RETICCTPCT in the last 72 hours. Sepsis Labs: Recent Labs  Lab 12/21/18 1546 12/22/18 0614 12/23/18 0633 12/24/18 [id number]  LATICACIDVEN 0.9 2.3* 2.9* 0.9    Recent Results (from the past 240 hour(s))  SARS Coronavirus 2 [facility] order, Performed in [facility] hospital lab) Nasopharyngeal Nasopharyngeal Swab     Status: None   Collection Time: 12/20/18 10:18 AM   Specimen: Nasopharyngeal Swab  Result Value Ref Range Status   SARS Coronavirus 2 NEGATIVE NEGATIVE Final    Comment: (NOTE) If result is NEGATIVE SARS-CoV-2 target nucleic acids are NOT DETECTED. The SARS-CoV-2 RNA is generally detectable in upper and lower  respiratory specimens during the acute phase of infection. The lowest  concentration of SARS-CoV-2 viral copies this assay can detect is 250  copies / mL. A negative result does not preclude SARS-CoV-2 infection  and should not be used as the sole basis for treatment or other  patient management decisions.  A negative result may occur with   improper specimen collection / handling, submission of specimen other  than nasopharyngeal swab, presence of viral mutation(s) within the  areas targeted by this assay, and inadequate number of viral copies  (<250 copies / mL). A negative result must be combined with clinical  observations, patient history, and epidemiological information. If result is POSITIVE SARS-CoV-2 target nucleic acids are DETECTED. The SARS-CoV-2 RNA is generally detectable in upper and lower  respiratory specimens dur ing the acute phase of infection.  Positive  results are indicative of active infection with SARS-CoV-2.  Clinical  correlation with patient history and other diagnostic information is  necessary to determine patient infection status.  Positive results do  not rule out bacterial infection or co-infection with other viruses. If result is PRESUMPTIVE POSTIVE SARS-CoV-2 nucleic acids MAY BE PRESENT.   A presumptive positive result was obtained on the submitted specimen  and confirmed on repeat testing.  While 2019 novel coronavirus  (SARS-CoV-2) nucleic acids may be  present in the submitted sample  additional confirmatory testing may be necessary for epidemiological  and / or clinical management purposes  to differentiate between  SARS-CoV-2 and other Sarbecovirus currently known to infect humans.  If clinically indicated additional testing with an alternate test  methodology [phone]) is advised. The SARS-CoV-2 RNA is generally  detectable in upper and lower respiratory sp ecimens during the acute  phase of infection. The expected result is Negative. Fact Sheet for Patients:  StrictlyIdeas.no Fact Sheet for Healthcare Providers: BankingDealers.co.za This test is not yet approved or cleared by the [place] FDA and has been authorized for detection and/or diagnosis of SARS-CoV-2 by FDA under an Emergency Use Authorization (EUA).  This EUA will  remain in effect (meaning this test can be used) for the duration of the COVID-19 declaration under Section 564(b)(1) of the Act, 21 U.S.C. section 360bbb-3(b)(1), unless the authorization is terminated or revoked sooner. Performed at [facility], [address], [place], [place] [address]   Blood culture (routine x 2)     Status: None (Preliminary result)   Collection Time: 12/20/18  2:08 PM   Specimen: BLOOD RIGHT HAND  Result Value Ref Range Status   Specimen Description BLOOD RIGHT HAND  Final   Special Requests   Final    BOTTLES DRAWN AEROBIC AND ANAEROBIC Blood Culture adequate volume   Culture   Final    NO GROWTH 4 DAYS Performed at [facility], [address], [place], [place] [address]    Report Status PENDING  Incomplete  Blood culture (routine x 2)     Status: None (Preliminary result)   Collection Time: 12/20/18  2:09 PM   Specimen: BLOOD RIGHT HAND  Result Value Ref Range Status   Specimen Description BLOOD RIGHT HAND  Final   Special Requests   Final    BOTTLES DRAWN AEROBIC AND ANAEROBIC Blood Culture adequate volume   Culture   Final    NO GROWTH 4 DAYS Performed at [facility], [address], [place], [place] [address]    Report Status PENDING  Incomplete  MRSA PCR Screening     Status: None   Collection Time: 12/21/18  8:10 AM   Specimen: Nasal Mucosa; Nasopharyngeal  Result Value Ref Range Status   MRSA by PCR NEGATIVE NEGATIVE Final    Comment:        The GeneXpert MRSA Assay (FDA approved for NASAL specimens only), is one component of a comprehensive MRSA colonization surveillance program. It is not intended to diagnose MRSA infection nor to guide or monitor treatment for MRSA infections. Performed at [facility], [address], [place], [place] [address]   Sputum culture     Status: None   Collection Time: 12/22/18 12:36 AM   Specimen: Expectorated Sputum  Result Value Ref Range Status   Specimen Description EXPECTORATED SPUTUM  Final    Special Requests NONE  Final   Sputum evaluation   Final    THIS SPECIMEN IS ACCEPTABLE FOR SPUTUM CULTURE Performed at [facility], [address], [place], [place] [address]    Report Status 12/22/2018 FINAL  Final  Culture, respiratory     Status: None (Preliminary result)   Collection Time: 12/22/18 12:36 AM  Result Value Ref Range Status   Specimen Description   Final    EXPECTORATED SPUTUM Performed at [facility], [address], [place], [place] [address]    Special Requests   Final    NONE Reflexed from [id number] Performed at  [address] [place], [place] [address]    Gram Stain NO WBC SEEN NO ORGANISMS SEEN   Final   Culture   Final    CULTURE REINCUBATED FOR BETTER GROWTH Performed at [facility], [place] [address], [place], [place] [address]    Report Status PENDING  Incomplete         Radiology Studies: No results found.      Scheduled Meds:  aspirin EC  81 mg Oral Daily   cabergoline  0.25 mg Oral 2 times weekly   clopidogrel  75 mg Oral Daily   enoxaparin (LOVENOX) injection  40 mg Subcutaneous Q24H   estradiol  2 mg Oral QHS   famotidine  20 mg Oral BID   feeding supplement (ENSURE ENLIVE)  237 mL Oral BID BM   guaiFENesin  1,200 mg Oral BID   levothyroxine  50 mcg Oral Daily   lidocaine  1 patch Transdermal Q24H   multivitamin with minerals  1 tablet Oral Daily   predniSONE  20 mg Oral Q breakfast   Continuous Infusions:  ceFEPime (MAXIPIME) IV 2 g (12/24/18 [id number])   metronidazole 500 mg (12/24/18 0924)     LOS: 3 days    Time spent: 30 minutes    Tayloranne Lekas Darleen Crocker, DO [facility] Pager [phone]  If 7PM-7AM, please contact night-coverage [email or web address] Password [facility] 12/24/2018, 11:57 AM

## 2018-12-24 NOTE — Progress Notes (Signed)
Pharmacy Antibiotic Note  Today is day #4 of cefepime and metronidazole therapy for  48 y.o. female admitted on 12/20/2018 with pneumonia and abscess.  WBC count is trending down and she's afebrile.  Blood cultures have shown no growth so far and sputum gram stain is negative.  Renal function appears stable, but has been  likely over-estimated due to patient's low BMI.   Plan: Continue Cefepime 2  IV q8h. Flagyl 500 mg IV every 8 hours. Monitor labs, c/s, and patient improvement.  Height: 5\' 2"  (157.5 cm) Weight: 101 lb 13.6 oz (46.2 kg) IBW/kg (Calculated) : 50.1  Temp (24hrs), Avg:98 F (36.7 C), Min:97.8 F (36.6 C), Max:98.2 F (36.8 C)  Recent Labs  Lab 12/20/18 1117 12/21/18 0517 12/21/18 1546 12/22/18 0614 12/23/18 0633 12/24/18 0623  WBC 20.5* 27.2*  --  37.1* 38.5* 24.2*  CREATININE 0.53 0.62  --  0.50 0.59 0.52  LATICACIDVEN  --   --  0.9 2.3* 2.9* 0.9    Estimated Creatinine Clearance: 62.7 mL/min (by C-G formula based on SCr of 0.52 mg/dL).    Allergies  Allergen Reactions  . Pantoprazole Sodium Itching and Rash  . Penicillins Itching and Rash    Has patient had a PCN reaction causing immediate rash, facial/tongue/throat swelling, SOB or lightheadedness with hypotension: Yes Has patient had a PCN reaction causing severe rash involving mucus membranes or skin necrosis: No Has patient had a PCN reaction that required hospitalization: No Has patient had a PCN reaction occurring within the last 10 years: No If all of the above answers are "NO", then may proceed with Cephalosporin use.   . Sulfa Antibiotics Itching and Rash  . Codeine Nausea And Vomiting  . Demerol [Meperidine]     Red streaks on arm after coadministration of phenergan and demerol at time of EGD in 2012. Tolerated phenergan with subsequent EGD.    Antimicrobials this admission: Cefepime 9/9 >>  Flagyl 9/9 >>  Levaquin 9/8 >> 9/9>>9/11   Microbiology results: 9/8 BCx: ng x4 days 9/8 Sputum:  no WBC/organisms seen  9/9 MRSA PCR:neg  Thank you for allowing pharmacy to be a part of this patient's care.  Despina Pole 12/24/2018 11:16 AM

## 2018-12-25 LAB — CBC
HCT: 33.1 % — ABNORMAL LOW (ref 36.0–46.0)
Hemoglobin: 10.2 g/dL — ABNORMAL LOW (ref 12.0–15.0)
MCH: 29.7 pg (ref 26.0–34.0)
MCHC: 30.8 g/dL (ref 30.0–36.0)
MCV: 96.2 fL (ref 80.0–100.0)
Platelets: 791 10*3/uL — ABNORMAL HIGH (ref 150–400)
RBC: 3.44 MIL/uL — ABNORMAL LOW (ref 3.87–5.11)
RDW: 13.2 % (ref 11.5–15.5)
WBC: 25.8 10*3/uL — ABNORMAL HIGH (ref 4.0–10.5)
nRBC: 0.1 % (ref 0.0–0.2)

## 2018-12-25 LAB — CULTURE, BLOOD (ROUTINE X 2)
Culture: NO GROWTH
Culture: NO GROWTH
Special Requests: ADEQUATE
Special Requests: ADEQUATE

## 2018-12-25 LAB — LACTIC ACID, PLASMA: Lactic Acid, Venous: 1.5 mmol/L (ref 0.5–1.9)

## 2018-12-25 MED ORDER — PREDNISONE 10 MG PO TABS
10.0000 mg | ORAL_TABLET | Freq: Every day | ORAL | Status: DC
  Administered 2018-12-25 – 2018-12-27 (×3): 10 mg via ORAL
  Filled 2018-12-25 (×3): qty 1

## 2018-12-25 MED ORDER — METRONIDAZOLE 500 MG PO TABS
500.0000 mg | ORAL_TABLET | Freq: Three times a day (TID) | ORAL | Status: DC
  Administered 2018-12-25 – 2018-12-27 (×6): 500 mg via ORAL
  Filled 2018-12-25 (×6): qty 1

## 2018-12-25 MED ORDER — SIMETHICONE 40 MG/0.6ML PO SUSP
20.0000 mg | Freq: Four times a day (QID) | ORAL | Status: DC | PRN
  Administered 2018-12-25: 20 mg via ORAL
  Filled 2018-12-25: qty 30

## 2018-12-25 NOTE — Progress Notes (Signed)
PHARMACIST - PHYSICIAN COMMUNICATION DR:   Shah/Hawkins CONCERNING: Antibiotic IV -to- Oral Route Change Policy  RECOMMENDATION: This patient is receiving metronidazole by the intravenous route.  Based on criteria approved by the Pharmacy and Therapeutics Committee, the antibiotic(s) is/are being converted to the equivalent oral dose form(s).   DESCRIPTION: These criteria include:  Patient being treated for a respiratory tract infection, urinary tract infection, cellulitis or clostridium difficile associated diarrhea if on metronidazole  The patient is not neutropenic and does not exhibit a GI malabsorption state  The patient is eating (either orally or via tube) and/or has been taking other orally administered medications for a least 24 hours  The patient is improving clinically and has a Tmax < 100.5  If you have questions about this conversion, please contact the Pharmacy Department  [x]   [phone] )  Forestine Na []   [phone] )  Zacarias Pontes  []   [phone] )  [facility] []   [phone] )  [place], [place]. D. Clinical Pharmacist 12/25/2018 9:03 AM

## 2018-12-25 NOTE — Progress Notes (Signed)
Subjective: She says she feels better.  She is having side effects from prednisone.  She does not have any other new complaints.  She is anxious.  Objective: Vital signs in last 24 hours: Temp:  [97.5 F (36.4 C)-98 F (36.7 C)] 97.6 F (36.4 C) (09/13 0449) Pulse Rate:  [57-70] 57 (09/13 0449) Resp:  [16-18] 18 (09/13 0449) BP: (131-158)/(71-93) 131/71 (09/13 0449) SpO2:  [96 %-99 %] 96 % (09/13 0449) Weight change:  Last BM Date: 12/22/18  Intake/Output from previous day: 09/12 0701 - 09/13 0700 In: 2886.9 [P.O.:1830; I.V.:65.2; IV Piggyback:991.8] Out: 2200 [Urine:2200]  PHYSICAL EXAM General appearance: alert and cooperative Resp: Her chest is clearing substantially but she still has some rales on the right Cardio: regular rate and rhythm, S1, S2 normal, no murmur, click, rub or gallop GI: soft, non-tender; bowel sounds normal; no masses,  no organomegaly Extremities: extremities normal, atraumatic, no cyanosis or edema  Lab Results:  Results for orders placed or performed during the hospital encounter of 12/20/18 (from the past 48 hour(s))  CBC     Status: Abnormal   Collection Time: 12/24/18  6:23 AM  Result Value Ref Range   WBC 24.2 (H) 4.0 - 10.5 K/uL   RBC 3.11 (L) 3.87 - 5.11 MIL/uL   Hemoglobin 9.3 (L) 12.0 - 15.0 g/dL   HCT 29.5 (L) 36.0 - 46.0 %   MCV 94.9 80.0 - 100.0 fL   MCH 29.9 26.0 - 34.0 pg   MCHC 31.5 30.0 - 36.0 g/dL   RDW 13.0 11.5 - 15.5 %   Platelets 679 (H) 150 - 400 K/uL   nRBC 0.0 0.0 - 0.2 %    Comment: Performed at [facility], [address], [place], [place] [id number]  Basic metabolic panel     Status: Abnormal   Collection Time: 12/24/18  6:23 AM  Result Value Ref Range   Sodium 144 135 - 145 mmol/L   Potassium 3.9 3.5 - 5.1 mmol/L   Chloride 110 98 - 111 mmol/L   CO2 26 22 - 32 mmol/L   Glucose, Bld 152 (H) 70 - 99 mg/dL   BUN 12 6 - 20 mg/dL   Creatinine, Ser 0.52 0.44 - 1.00 mg/dL   Calcium 8.5 (L) 8.9 - 10.3 mg/dL   GFR  calc non Af Amer >60 >60 mL/min   GFR calc Af Amer >60 >60 mL/min   Anion gap 8 5 - 15    Comment: Performed at [facility], [address] [place], [place] [address]  Lactic acid, plasma     Status: None   Collection Time: 12/24/18  6:23 AM  Result Value Ref Range   Lactic Acid, Venous 0.9 0.5 - 1.9 mmol/L    Comment: Performed at [facility], [address], [place], [place] [address]  CBC     Status: Abnormal   Collection Time: 12/25/18  6:03 AM  Result Value Ref Range   WBC 25.8 (H) 4.0 - 10.5 K/uL   RBC 3.44 (L) 3.87 - 5.11 MIL/uL   Hemoglobin 10.2 (L) 12.0 - 15.0 g/dL   HCT 33.1 (L) 36.0 - 46.0 %   MCV 96.2 80.0 - 100.0 fL   MCH 29.7 26.0 - 34.0 pg   MCHC 30.8 30.0 - 36.0 g/dL   RDW 13.2 11.5 - 15.5 %   Platelets 791 (H) 150 - 400 K/uL   nRBC 0.1 0.0 - 0.2 %    Comment: Performed at [facility], [place]  [address], [place], [place] [address]  Lactic acid, plasma     Status: None   Collection Time: 12/25/18  6:03 AM  Result Value Ref Range   Lactic Acid, Venous 1.5 0.5 - 1.9 mmol/L    Comment: Performed at [facility], [address], [place], [place] [address]    ABGS No results for input(s): PHART, PO2ART, TCO2, HCO3 in the last 72 hours.  Invalid input(s): PCO2 CULTURES Recent Results (from the past 240 hour(s))  [facility] order, Performed in [facility] hospital lab) Nasopharyngeal Nasopharyngeal Swab     Status: None   Collection Time: 12/20/18 10:18 AM   Specimen: Nasopharyngeal Swab  Result Value Ref Range Status   SARS Coronavirus 2 NEGATIVE NEGATIVE Final    Comment: (NOTE) If result is NEGATIVE SARS-CoV-2 target nucleic acids are NOT DETECTED. The SARS-CoV-2 RNA is generally detectable in upper and lower  respiratory specimens during the acute phase of infection. The lowest  concentration of SARS-CoV-2 viral copies this assay can detect is 250  copies / mL. A negative result does not preclude SARS-CoV-2 infection  and should not be used  as the sole basis for treatment or other  patient management decisions.  A negative result may occur with  improper specimen collection / handling, submission of specimen other  than nasopharyngeal swab, presence of viral mutation(s) within the  areas targeted by this assay, and inadequate number of viral copies  (<250 copies / mL). A negative result must be combined with clinical  observations, patient history, and epidemiological information. If result is POSITIVE SARS-CoV-2 target nucleic acids are DETECTED. The SARS-CoV-2 RNA is generally detectable in upper and lower  respiratory specimens dur ing the acute phase of infection.  Positive  results are indicative of active infection with SARS-CoV-2.  Clinical  correlation with patient history and other diagnostic information is  necessary to determine patient infection status.  Positive results do  not rule out bacterial infection or co-infection with other viruses. If result is PRESUMPTIVE POSTIVE SARS-CoV-2 nucleic acids MAY BE PRESENT.   A presumptive positive result was obtained on the submitted specimen  and confirmed on repeat testing.  While 2019 novel coronavirus  (SARS-CoV-2) nucleic acids may be present in the submitted sample  additional confirmatory testing may be necessary for epidemiological  and / or clinical management purposes  to differentiate between  SARS-CoV-2 and other Sarbecovirus currently known to infect humans.  If clinically indicated additional testing with an alternate test  methodology [phone]) is advised. The SARS-CoV-2 RNA is generally  detectable in upper and lower respiratory sp ecimens during the acute  phase of infection. The expected result is Negative. Fact Sheet for Patients:  StrictlyIdeas.no Fact Sheet for Healthcare Providers: BankingDealers.co.za This test is not yet approved or cleared by the [place] FDA and has been authorized for  detection and/or diagnosis of SARS-CoV-2 by FDA under an Emergency Use Authorization (EUA).  This EUA will remain in effect (meaning this test can be used) for the duration of the COVID-19 declaration under Section 564(b)(1) of the Act, 21 U.S.C. section 360bbb-3(b)(1), unless the authorization is terminated or revoked sooner. Performed at [facility], [address] [place], [place] [address]   Blood culture (routine x 2)     Status: None (Preliminary result)   Collection Time: 12/20/18  2:08 PM   Specimen: BLOOD RIGHT HAND  Result Value Ref Range Status   Specimen Description BLOOD RIGHT HAND  Final   Special Requests  Final    BOTTLES DRAWN AEROBIC AND ANAEROBIC Blood Culture adequate volume   Culture   Final    NO GROWTH 4 DAYS Performed at [facility], [address], [place], [place] [address]    Report Status PENDING  Incomplete  Blood culture (routine x 2)     Status: None (Preliminary result)   Collection Time: 12/20/18  2:09 PM   Specimen: BLOOD RIGHT HAND  Result Value Ref Range Status   Specimen Description BLOOD RIGHT HAND  Final   Special Requests   Final    BOTTLES DRAWN AEROBIC AND ANAEROBIC Blood Culture adequate volume   Culture   Final    NO GROWTH 4 DAYS Performed at [facility], [address] [place], [place] [address]    Report Status PENDING  Incomplete  MRSA PCR Screening     Status: None   Collection Time: 12/21/18  8:10 AM   Specimen: Nasal Mucosa; Nasopharyngeal  Result Value Ref Range Status   MRSA by PCR NEGATIVE NEGATIVE Final    Comment:        The GeneXpert MRSA Assay (FDA approved for NASAL specimens only), is one component of a comprehensive MRSA colonization surveillance program. It is not intended to diagnose MRSA infection nor to guide or monitor treatment for MRSA infections. Performed at [facility], [address], [place], [place] [address]   Sputum culture     Status: None   Collection Time: 12/22/18 12:36 AM   Specimen:  Expectorated Sputum  Result Value Ref Range Status   Specimen Description EXPECTORATED SPUTUM  Final   Special Requests NONE  Final   Sputum evaluation   Final    THIS SPECIMEN IS ACCEPTABLE FOR SPUTUM CULTURE Performed at [facility], [address] [place], [place] [address]    Report Status 12/22/2018 FINAL  Final  Culture, respiratory     Status: None   Collection Time: 12/22/18 12:36 AM  Result Value Ref Range Status   Specimen Description   Final    EXPECTORATED SPUTUM Performed at [facility], [address], [place], [place] [address]    Special Requests   Final    NONE Reflexed from [id number] Performed at [facility], [address] [place], [place] [address]    Gram Stain NO WBC SEEN NO ORGANISMS SEEN   Final   Culture   Final    FEW Consistent with normal respiratory flora. Performed at [facility], [place] [address] [place], [place] [address]    Report Status 12/24/2018 FINAL  Final   Studies/Results: No results found.  Medications:  Prior to Admission:  Medications Prior to Admission  Medication Sig Dispense Refill Last Dose  . aspirin EC 81 MG tablet Take 81 mg by mouth daily.   12/19/2018 at 900  . cabergoline (DOSTINEX) 0.5 MG tablet Take 0.5 tablets (0.25 mg total) by mouth 2 (two) times a week. 10 tablet 2 12/19/2018 at Unknown time  . clopidogrel (PLAVIX) 75 MG tablet Take 1 tablet (75 mg total) by mouth daily. 30 tablet 0 12/19/2018 at 900  . estradiol (ESTRACE) 2 MG tablet TAKE 1 TABLET BY MOUTH ONCE DAILY. 30 tablet 11 12/19/2018 at Unknown time  . Icosapent Ethyl (VASCEPA) 1 G CAPS Take 2 g by mouth 2 (two) times daily.    12/19/2018 at Unknown time  . lansoprazole (PREVACID) 30 MG capsule TAKE 1 CAPSULE IN THE MORNING (1 HOUR BEFORE BREAKFAST) 30 capsule 5 12/19/2018 at Unknown time  .  levothyroxine (SYNTHROID) 50 MCG tablet Take 1 tablet (50 mcg total) by mouth daily. 90 tablet 1 12/19/2018 at Unknown time  . Multiple Vitamin (MULTIVITAMIN) tablet Take 1  tablet by mouth daily.   12/19/2018 at Unknown time  . rosuvastatin (CRESTOR) 20 MG tablet Take 20 mg by mouth daily.   12/19/2018 at Unknown time  . naproxen sodium (ALEVE) 220 MG tablet Take 220 mg by mouth daily as needed (pain).     . ranitidine (ZANTAC) 300 MG tablet TAKE ONE TABLET BY MOUTH AT BEDTIME. (Patient not taking: Reported on 12/20/2018) 30 tablet 5 Not Taking at Unknown time   Scheduled: . aspirin EC  81 mg Oral Daily  . cabergoline  0.25 mg Oral 2 times weekly  . clopidogrel  75 mg Oral Daily  . enoxaparin (LOVENOX) injection  40 mg Subcutaneous Q24H  . estradiol  2 mg Oral QHS  . famotidine  20 mg Oral BID  . feeding supplement (ENSURE ENLIVE)  237 mL Oral BID BM  . guaiFENesin  1,200 mg Oral BID  . levothyroxine  50 mcg Oral Daily  . lidocaine  1 patch Transdermal Q24H  . multivitamin with minerals  1 tablet Oral Daily  . predniSONE  10 mg Oral Q breakfast   Continuous: . sodium chloride 250 mL (12/24/18 1459)  . ceFEPime (MAXIPIME) IV 2 g (12/25/18 0543)  . metronidazole 500 mg (12/25/18 0337)   [id number] chloride, acetaminophen, alum & mag hydroxide-simeth, HYDROmorphone (DILAUDID) injection, ipratropium-albuterol, ketorolac, sodium chloride  Assesment: She is admitted with pneumonia.  This is in the right lower and middle lobe.  She has an area of cavitation.  Clinically she is improved.  At baseline she has elevated white blood cell count usually in the 12-16,000 range.  White blood count is about 25,000 now.  She is on prednisone and having side effects  She has acute hypoxic respiratory failure and she remains on oxygen  She has significant problems with anxiety Principal Problem:   RLL pneumonia (HCC) Active Problems:   Carotid artery disease (HCC)   Hyperlipidemia   GERD (gastroesophageal reflux disease)   Stroke (HCC)   Tobacco abuse   Acute hypoxemic respiratory failure ([place])    Plan: She will continue treatments.  Discussed with Dr. Manuella Ghazi.  Cut  her prednisone dose in half    LOS: 4 days   Alonza Bogus 12/25/2018, 8:02 AM

## 2018-12-25 NOTE — Progress Notes (Signed)
PROGRESS NOTE    [name]  [id number] DOB: [birth year] DOA: 12/20/2018 PCP: Sander Radon, NP   Brief Narrative:  Per HPI: [name] a 48 y.o.femalewith past medical history significant for gastroesophageal reflux disease, hyperlipidemia, tobacco abuse, hypothyroidism,carotid artery disease and previous stroke (without significant residual deficit);who presented to the emergency department secondary to right-sided chest discomfort, shortness of breath and coughing spells. Patient reports significant nonproductive cough over the last 3 days and on the day of admission reports difficulty catching her breath and feeling short of breath. Patient denies any specific chest pain, no nausea, no vomiting, no headaches, no blurred vision, no focal weakness, no dysuria, no hematuria, no melena, no sick contacts or any other complaints. In the ED chest x-ray and CT chest demonstrated right lower lobe pneumonia;patient with WBCs in the 20,000 range) chronically with leukocytosis in the 12-16Krange),patient found to be febrile and experiencing tachypnea with significant right-sided chest discomfort. Cultures were taken, IV fluids initiated and IV antibiotics given. [facility] has been called on the patient for further evaluation and management.  9/9:Patient noted to have significant right-sided chest pain that has been persistent since admission. She does not appear to be responding well to IV Levaquin and will switch to IV Zosyn today after discussion with pulmonology. We will also add IV steroids to assist with symptoms. Will likely require thoracentesis in the outpatient setting. Continue pain medications.  9/10: Worsening leukocytosis as well as lactic acidosis noted this morning. Clinically patient appears stable. Fluid bolus initiated for lactic acidosis today and will monitor blood pressures and repeat lactic acid a.m. Pulmonology consulted with recommendations appreciated to  include reinitiation of Levaquin until Legionella antigen returns.  9/11:Patient continues have persistent leukocytosis as well as increased lactic acidosis this morning. Clinically she is looking better, however. Plans to continue current antibiotics with Legionella antigen pending. Continue on IV fluid at higher rate today.  9/12: Laboratory data much improved this morning with lactic acidosis resolved.  Patient is clinically feeling better and will attempt to wean oxygen.  Legionella antigen negative and Levaquin will be discontinued.  Transition to oral antibiotics today.  Discontinue IV fluid.  Continue to monitor a.m. labs and maintain on cefepime and Flagyl.  9/13: Laboratory data is beginning to stabilize, and patient is on 3 L nasal cannula.  Likely will need to go home on home oxygen.  She continues to feel well, but does have some worsening anxiety for which prednisone dose has been cut by pulmonology.   Assessment & Plan:   Principal Problem:   RLL pneumonia ([place]) Active Problems:   Carotid artery disease ([place])   Hyperlipidemia   GERD (gastroesophageal reflux disease)   Stroke ([facility])   Tobacco abuse   Acute hypoxemic respiratory failure ([facility])   1-acute hypoxemic respiratory failuresecondary toRLL pneumoniawith associated abscessand worsening lactic acidosis-improving -Continue IV cefepime and Flagyl for now with Levaquin discontinued on 9/12 as Legionella antigen negative. -Blood cultures and urine culture with no growth thus far. -Prednisone dose cut to half on account of some anxiety -Flutter valve/incentive spirometer and the use of DuoNeb -follow clinical response -Will require thoracentesis inpatient versus outpatient based on therapeutic response -We will likely require home oxygen temporarily -Continue pain management with Lidoderm patch.  2-hyperlipidemia -Hold Lovaza and statins with LDL noted to be 28  3-hypothyroidism -Continue Synthroid. -Normal  TSH recently checked  4-GERD -will use Pepcid  5-hx of stroke/carotid artery disease -continue aspirin, plavix and statins  -no acute neurologic  Deficit reported.  6-tobacco abusewith likely early COPD -cessation counseling provided -Patient declining nicotine patch  7-hypokalemia/hypomagnesemia -Resolved   DVT prophylaxis:Lovenox Code Status:Full Family Communication:None at bedside Disposition Plan:Continue current treatments with prednisone dose cut in half.  Continue to wean oxygen.  Anticipate discharge in next 24 hours with likely home oxygen needs.  Consultants:  Pulmonology  Procedures:  None  Antimicrobials:  Anti-infectives (From admission, onward)   Start     Dose/Rate Route Frequency Ordered Stop   12/25/18 1400  metroNIDAZOLE (FLAGYL) tablet 500 mg     500 mg Oral Every 8 hours 12/25/18 0905     12/22/18 0930  levofloxacin (LEVAQUIN) IVPB 500 mg  Status:  Discontinued     500 mg 100 mL/hr over 60 Minutes Intravenous Every 24 hours 12/22/18 0829 12/24/18 0755   12/21/18 1300  ceFEPIme (MAXIPIME) 2 g in sodium chloride 0.9 % 100 mL IVPB     2 g 200 mL/hr over 30 Minutes Intravenous Every 8 hours 12/21/18 1254     12/21/18 1000  ceFEPIme (MAXIPIME) 2 g in sodium chloride 0.9 % 100 mL IVPB  Status:  Discontinued     2 g 200 mL/hr over 30 Minutes Intravenous Every 8 hours 12/21/18 0953 12/21/18 1254   12/21/18 1000  metroNIDAZOLE (FLAGYL) IVPB 500 mg  Status:  Discontinued     500 mg 100 mL/hr over 60 Minutes Intravenous Every 8 hours 12/21/18 0953 12/25/18 0905   12/20/18 1353  levofloxacin (LEVAQUIN) IVPB 750 mg  Status:  Discontinued     750 mg 100 mL/hr over 90 Minutes Intravenous Every 24 hours 12/20/18 1353 12/21/18 0810   12/20/18 1330  levofloxacin (LEVAQUIN) IVPB 500 mg  Status:  Discontinued     500 mg 100 mL/hr over 60 Minutes Intravenous  Once 12/20/18 1318 12/20/18 1358       Subjective: Patient seen and evaluated today with  no new acute complaints or concerns. No acute concerns or events noted overnight.  Noted to have some overnight anxiety.  Oxygen is being weaned to 3 L.  Objective: Vitals:   12/24/18 1500 12/24/18 1934 12/24/18 2135 12/25/18 0449  BP: (!) 158/93  (!) 146/77 131/71  Pulse: 70 64 61 (!) 57  Resp: 18 16 16 18   Temp: 98 F (36.7 C)  (!) 97.5 F (36.4 C) 97.6 F (36.4 C)  TempSrc:   Oral Oral  SpO2: 99% 97% 97% 96%  Weight:      Height:        Intake/Output Summary (Last 24 hours) at 12/25/2018 0920 Last data filed at 12/25/2018 0648 Gross per 24 hour  Intake 2886.92 ml  Output 2200 ml  Net 686.92 ml   Filed Weights   12/20/18 0957 12/20/18 1441  Weight: 49.9 kg 46.2 kg    Examination:  General exam: Appears calm and comfortable  Respiratory system: Clear to auscultation. Respiratory effort normal.  3 L nasal cannula. Cardiovascular system: S1 & S2 heard, RRR. No JVD, murmurs, rubs, gallops or clicks. No pedal edema. Gastrointestinal system: Abdomen is nondistended, soft and nontender. No organomegaly or masses felt. Normal bowel sounds heard. Central nervous system: Alert and oriented. No focal neurological deficits. Extremities: Symmetric 5 x 5 power. Skin: No rashes, lesions or ulcers Psychiatry: Mildly anxious    Data Reviewed: I have personally reviewed following labs and imaging studies  CBC: Recent Labs  Lab 12/20/18 1117 12/21/18 0517 12/22/18 0614 12/23/18 0633 12/24/18 0623 12/25/18 0603  WBC 20.5* 27.2* 37.1* 38.5*  24.2* 25.8*  NEUTROABS 17.9*  --  33.8*  --   --   --   HGB 10.8* 9.6* 10.1* 10.0* 9.3* 10.2*  HCT 34.4* 30.0* 32.0* 31.7* 29.5* 33.1*  MCV 94.8 94.3 96.1 96.4 94.9 96.2  PLT 663* 634* 699* 745* 679* [id number]*   Basic Metabolic Panel: Recent Labs  Lab 12/20/18 1117 12/21/18 0517 12/22/18 0614 12/23/18 0633 12/24/18 0623  NA 136 132* 138 144 144  K 3.9 3.4* 4.4 4.2 3.9  CL 98 97* 107 111 110  CO2 27 26 22 24 26   GLUCOSE 112* 85 174*  193* 152*  BUN <5* 8 8 10 12   CREATININE 0.53 0.62 0.50 0.59 0.52  CALCIUM 8.5* 7.6* 8.5* 8.6* 8.5*  MG 1.4*  --  2.0  --   --    GFR: Estimated Creatinine Clearance: 62.7 mL/min (by C-G formula based on SCr of 0.52 mg/dL). Liver Function Tests: Recent Labs  Lab 12/20/18 1117  AST 14*  ALT 10  ALKPHOS 92  BILITOT 0.6  PROT 7.5  ALBUMIN 3.0*   No results for input(s): LIPASE, AMYLASE in the last 168 hours. No results for input(s): AMMONIA in the last 168 hours. Coagulation Profile: No results for input(s): INR, PROTIME in the last 168 hours. Cardiac Enzymes: No results for input(s): CKTOTAL, CKMB, CKMBINDEX, TROPONINI in the last 168 hours. BNP (last 3 results) No results for input(s): PROBNP in the last 8760 hours. HbA1C: No results for input(s): HGBA1C in the last 72 hours. CBG: No results for input(s): GLUCAP in the last 168 hours. Lipid Profile: No results for input(s): CHOL, HDL, LDLCALC, TRIG, CHOLHDL, LDLDIRECT in the last 72 hours. Thyroid Function Tests: No results for input(s): TSH, T4TOTAL, FREET4, T3FREE, THYROIDAB in the last 72 hours. Anemia Panel: No results for input(s): VITAMINB12, FOLATE, FERRITIN, TIBC, IRON, RETICCTPCT in the last 72 hours. Sepsis Labs: Recent Labs  Lab 12/22/18 0614 12/23/18 0633 12/24/18 0623 12/25/18 0603  LATICACIDVEN 2.3* 2.9* 0.9 1.5    Recent Results (from the past 240 hour(s))  SARS Coronavirus 2 [facility] order, Performed in [facility] hospital lab) Nasopharyngeal Nasopharyngeal Swab     Status: None   Collection Time: 12/20/18 10:18 AM   Specimen: Nasopharyngeal Swab  Result Value Ref Range Status   SARS Coronavirus 2 NEGATIVE NEGATIVE Final    Comment: (NOTE) If result is NEGATIVE SARS-CoV-2 target nucleic acids are NOT DETECTED. The SARS-CoV-2 RNA is generally detectable in upper and lower  respiratory specimens during the acute phase of infection. The lowest  concentration of SARS-CoV-2 viral copies this assay  can detect is 250  copies / mL. A negative result does not preclude SARS-CoV-2 infection  and should not be used as the sole basis for treatment or other  patient management decisions.  A negative result may occur with  improper specimen collection / handling, submission of specimen other  than nasopharyngeal swab, presence of viral mutation(s) within the  areas targeted by this assay, and inadequate number of viral copies  (<250 copies / mL). A negative result must be combined with clinical  observations, patient history, and epidemiological information. If result is POSITIVE SARS-CoV-2 target nucleic acids are DETECTED. The SARS-CoV-2 RNA is generally detectable in upper and lower  respiratory specimens dur ing the acute phase of infection.  Positive  results are indicative of active infection with SARS-CoV-2.  Clinical  correlation with patient history and other diagnostic information is  necessary to determine patient infection status.  Positive results  do  not rule out bacterial infection or co-infection with other viruses. If result is PRESUMPTIVE POSTIVE SARS-CoV-2 nucleic acids MAY BE PRESENT.   A presumptive positive result was obtained on the submitted specimen  and confirmed on repeat testing.  While 2019 novel coronavirus  (SARS-CoV-2) nucleic acids may be present in the submitted sample  additional confirmatory testing may be necessary for epidemiological  and / or clinical management purposes  to differentiate between  SARS-CoV-2 and other Sarbecovirus currently known to infect humans.  If clinically indicated additional testing with an alternate test  methodology [id number]) is advised. The SARS-CoV-2 RNA is generally  detectable in upper and lower respiratory sp ecimens during the acute  phase of infection. The expected result is Negative. Fact Sheet for Patients:  StrictlyIdeas.no Fact Sheet for Healthcare Providers:  BankingDealers.co.za This test is not yet approved or cleared by the [place] FDA and has been authorized for detection and/or diagnosis of SARS-CoV-2 by FDA under an Emergency Use Authorization (EUA).  This EUA will remain in effect (meaning this test can be used) for the duration of the COVID-19 declaration under Section 564(b)(1) of the Act, 21 U.S.C. section 360bbb-3(b)(1), unless the authorization is terminated or revoked sooner. Performed at [facility], [address] [place], [place] [address]   Blood culture (routine x 2)     Status: None (Preliminary result)   Collection Time: 12/20/18  2:08 PM   Specimen: BLOOD RIGHT HAND  Result Value Ref Range Status   Specimen Description BLOOD RIGHT HAND  Final   Special Requests   Final    BOTTLES DRAWN AEROBIC AND ANAEROBIC Blood Culture adequate volume   Culture   Final    NO GROWTH 4 DAYS Performed at [facility], [address] [place], [place] [address]    Report Status PENDING  Incomplete  Blood culture (routine x 2)     Status: None (Preliminary result)   Collection Time: 12/20/18  2:09 PM   Specimen: BLOOD RIGHT HAND  Result Value Ref Range Status   Specimen Description BLOOD RIGHT HAND  Final   Special Requests   Final    BOTTLES DRAWN AEROBIC AND ANAEROBIC Blood Culture adequate volume   Culture   Final    NO GROWTH 4 DAYS Performed at [facility], [address], [place], [place] [address]    Report Status PENDING  Incomplete  MRSA PCR Screening     Status: None   Collection Time: 12/21/18  8:10 AM   Specimen: Nasal Mucosa; Nasopharyngeal  Result Value Ref Range Status   MRSA by PCR NEGATIVE NEGATIVE Final    Comment:        The GeneXpert MRSA Assay (FDA approved for NASAL specimens only), is one component of a comprehensive MRSA colonization surveillance program. It is not intended to diagnose MRSA infection nor to guide or monitor treatment for MRSA infections. Performed at  [facility], [address], [place], [place] [address]   Sputum culture     Status: None   Collection Time: 12/22/18 12:36 AM   Specimen: Expectorated Sputum  Result Value Ref Range Status   Specimen Description EXPECTORATED SPUTUM  Final   Special Requests NONE  Final   Sputum evaluation   Final    THIS SPECIMEN IS ACCEPTABLE FOR SPUTUM CULTURE Performed at [facility], [address], [place], [place] [address]    Report Status 12/22/2018 FINAL  Final  Culture, respiratory     Status: None   Collection  Time: 12/22/18 12:36 AM  Result Value Ref Range Status   Specimen Description   Final    EXPECTORATED SPUTUM Performed at [facility], [address], [place], [place] [address]    Special Requests   Final    NONE Reflexed from [id number] Performed at [facility], [address] [place], [place] [address]    Gram Stain NO WBC SEEN NO ORGANISMS SEEN   Final   Culture   Final    FEW Consistent with normal respiratory flora. Performed at [facility], [place] [address], [place],  [address]    Report Status 12/24/2018 FINAL  Final         Radiology Studies: No results found.      Scheduled Meds: . aspirin EC  81 mg Oral Daily  . cabergoline  0.25 mg Oral 2 times weekly  . clopidogrel  75 mg Oral Daily  . enoxaparin (LOVENOX) injection  40 mg Subcutaneous Q24H  . estradiol  2 mg Oral QHS  . famotidine  20 mg Oral BID  . feeding supplement (ENSURE ENLIVE)  237 mL Oral BID BM  . guaiFENesin  1,200 mg Oral BID  . levothyroxine  50 mcg Oral Daily  . lidocaine  1 patch Transdermal Q24H  . metroNIDAZOLE  500 mg Oral Q8H  . multivitamin with minerals  1 tablet Oral Daily  . predniSONE  10 mg Oral Q breakfast   Continuous Infusions: . sodium chloride 250 mL (12/24/18 1459)  . ceFEPime (MAXIPIME) IV 2 g (12/25/18 0543)     LOS: 4 days    Time spent: 30 minutes     Darleen Crocker, DO [facility] Pager [phone]  If 7PM-7AM, please contact  night-coverage [email or web address] Password [email or web address] 12/25/2018, 9:20 AM

## 2018-12-26 ENCOUNTER — Inpatient Hospital Stay (HOSPITAL_COMMUNITY): Payer: BC Managed Care – PPO

## 2018-12-26 LAB — LACTIC ACID, PLASMA: Lactic Acid, Venous: 0.8 mmol/L (ref 0.5–1.9)

## 2018-12-26 LAB — BASIC METABOLIC PANEL
Anion gap: 9 (ref 5–15)
BUN: 8 mg/dL (ref 6–20)
CO2: 29 mmol/L (ref 22–32)
Calcium: 7.8 mg/dL — ABNORMAL LOW (ref 8.9–10.3)
Chloride: 99 mmol/L (ref 98–111)
Creatinine, Ser: 0.42 mg/dL — ABNORMAL LOW (ref 0.44–1.00)
GFR calc Af Amer: >60 mL/min (ref >60)
GFR calc non Af Amer: >60 mL/min (ref >60)
Glucose, Bld: 114 mg/dL — ABNORMAL HIGH (ref 70–99)
Potassium: 2.6 mmol/L — CL (ref 3.5–5.1)
Sodium: 137 mmol/L (ref 135–145)

## 2018-12-26 LAB — CBC
HCT: 33 % — ABNORMAL LOW (ref 36.0–46.0)
Hemoglobin: 10.6 g/dL — ABNORMAL LOW (ref 12.0–15.0)
MCH: 29.9 pg (ref 26.0–34.0)
MCHC: 32.1 g/dL (ref 30.0–36.0)
MCV: 93.2 fL (ref 80.0–100.0)
Platelets: 697 10*3/uL — ABNORMAL HIGH (ref 150–400)
RBC: 3.54 MIL/uL — ABNORMAL LOW (ref 3.87–5.11)
RDW: 12.7 % (ref 11.5–15.5)
WBC: 24.7 10*3/uL — ABNORMAL HIGH (ref 4.0–10.5)
nRBC: 0.1 % (ref 0.0–0.2)

## 2018-12-26 LAB — GLUCOSE, CAPILLARY: Glucose-Capillary: 95 mg/dL (ref 70–99)

## 2018-12-26 LAB — MAGNESIUM: Magnesium: 1.8 mg/dL (ref 1.7–2.4)

## 2018-12-26 MED ORDER — ONDANSETRON HCL 4 MG/2ML IJ SOLN
4.0000 mg | Freq: Four times a day (QID) | INTRAMUSCULAR | Status: DC | PRN
  Filled 2018-12-26: qty 2

## 2018-12-26 MED ORDER — POTASSIUM CHLORIDE CRYS ER 20 MEQ PO TBCR
40.0000 meq | EXTENDED_RELEASE_TABLET | Freq: Three times a day (TID) | ORAL | Status: AC
  Administered 2018-12-26 (×3): 40 meq via ORAL
  Filled 2018-12-26 (×3): qty 2

## 2018-12-26 MED ORDER — ALPRAZOLAM 0.25 MG PO TABS: 0.2500 mg | ORAL_TABLET | Freq: Two times a day (BID) | ORAL | Status: DC | PRN

## 2018-12-26 MED ORDER — POTASSIUM CHLORIDE CRYS ER 20 MEQ PO TBCR
40.0000 meq | EXTENDED_RELEASE_TABLET | Freq: Two times a day (BID) | ORAL | Status: DC
  Administered 2018-12-26: 40 meq via ORAL
  Filled 2018-12-26: qty 2

## 2018-12-26 NOTE — Progress Notes (Signed)
Subjective: She feels much worse this morning.  She says she feels like she got choked on some food yesterday and ever since then she has been coughing and has chest pain again.  She feels like she cannot "catch her breath"  Objective: Vital signs in last 24 hours: Temp:  [97.3 F (36.3 C)-98.6 F (37 C)] 98.2 F (36.8 C) (09/14 0436) Pulse Rate:  [60-96] 82 (09/14 0555) Resp:  [16-18] 16 (09/13 1955) BP: (75-140)/(46-89) 89/59 (09/14 0555) SpO2:  [92 %-97 %] 94 % (09/14 0436) Weight change:  Last BM Date: 12/22/18  Intake/Output from previous day: 09/13 0701 - 09/14 0700 In: 1920 [P.O.:1920] Out: 6600 [Urine:6600]  PHYSICAL EXAM General appearance: alert, cooperative and mild distress Resp: She still has a rales in the right lung Cardio: regular rate and rhythm, S1, S2 normal, no murmur, click, rub or gallop GI: soft, non-tender; bowel sounds normal; no masses,  no organomegaly Extremities: extremities normal, atraumatic, no cyanosis or edema  Lab Results:  Results for orders placed or performed during the hospital encounter of 12/20/18 (from the past 48 hour(s))  CBC     Status: Abnormal   Collection Time: 12/25/18  6:03 AM  Result Value Ref Range   WBC 25.8 (H) 4.0 - 10.5 K/uL   RBC 3.44 (L) 3.87 - 5.11 MIL/uL   Hemoglobin 10.2 (L) 12.0 - 15.0 g/dL   HCT 33.1 (L) 36.0 - 46.0 %   MCV 96.2 80.0 - 100.0 fL   MCH 29.7 26.0 - 34.0 pg   MCHC 30.8 30.0 - 36.0 g/dL   RDW 13.2 11.5 - 15.5 %   Platelets 791 (H) 150 - 400 K/uL   nRBC 0.1 0.0 - 0.2 %    Comment: Performed at [facility], [address] [place], [place] [address]  Lactic acid, plasma     Status: None   Collection Time: 12/25/18  6:03 AM  Result Value Ref Range   Lactic Acid, Venous 1.5 0.5 - 1.9 mmol/L    Comment: Performed at [facility], [address] [place], [place] [address]  Glucose, capillary     Status: None   Collection Time: 12/26/18  1:55 AM  Result Value Ref Range   Glucose-Capillary 95 70 -  99 mg/dL  CBC     Status: Abnormal   Collection Time: 12/26/18  4:17 AM  Result Value Ref Range   WBC 24.7 (H) 4.0 - 10.5 K/uL   RBC 3.54 (L) 3.87 - 5.11 MIL/uL   Hemoglobin 10.6 (L) 12.0 - 15.0 g/dL   HCT 33.0 (L) 36.0 - 46.0 %   MCV 93.2 80.0 - 100.0 fL   MCH 29.9 26.0 - 34.0 pg   MCHC 32.1 30.0 - 36.0 g/dL   RDW 12.7 11.5 - 15.5 %   Platelets 697 (H) 150 - 400 K/uL   nRBC 0.1 0.0 - 0.2 %    Comment: Performed at [facility], [address], [place], [place] [id number]  Basic metabolic panel     Status: Abnormal   Collection Time: 12/26/18  4:17 AM  Result Value Ref Range   Sodium 137 135 - 145 mmol/L    Comment: DELTA CHECK NOTED   Potassium 2.6 (LL) 3.5 - 5.1 mmol/L    Comment: CRITICAL RESULT CALLED TO, READ BACK BY AND VERIFIED WITH: HOBBS,A AT 5:55AM ON 12/26/18 BY FESTERMAN,C    Chloride 99 98 - 111 mmol/L   CO2 29 22 - 32 mmol/L   Glucose, Bld 114 (  H) 70 - 99 mg/dL   BUN 8 6 - 20 mg/dL   Creatinine, Ser 0.42 (L) 0.44 - 1.00 mg/dL   Calcium 7.8 (L) 8.9 - 10.3 mg/dL   GFR calc non Af Amer >60 >60 mL/min   GFR calc Af Amer >60 >60 mL/min   Anion gap 9 5 - 15    Comment: Performed at [facility], [address], [place], [place] [address]  Lactic acid, plasma     Status: None   Collection Time: 12/26/18  4:17 AM  Result Value Ref Range   Lactic Acid, Venous 0.8 0.5 - 1.9 mmol/L    Comment: Performed at [facility], [address] [place], [place] [address]    ABGS No results for input(s): PHART, PO2ART, TCO2, HCO3 in the last 72 hours.  Invalid input(s): PCO2 CULTURES Recent Results (from the past 240 hour(s))  SARS Coronavirus 2 [facility] order, Performed in [facility] hospital lab) Nasopharyngeal Nasopharyngeal Swab     Status: None   Collection Time: 12/20/18 10:18 AM   Specimen: Nasopharyngeal Swab  Result Value Ref Range Status   SARS Coronavirus 2 NEGATIVE NEGATIVE Final    Comment: (NOTE) If result is NEGATIVE SARS-CoV-2 target nucleic acids are NOT  DETECTED. The SARS-CoV-2 RNA is generally detectable in upper and lower  respiratory specimens during the acute phase of infection. The lowest  concentration of SARS-CoV-2 viral copies this assay can detect is 250  copies / mL. A negative result does not preclude SARS-CoV-2 infection  and should not be used as the sole basis for treatment or other  patient management decisions.  A negative result may occur with  improper specimen collection / handling, submission of specimen other  than nasopharyngeal swab, presence of viral mutation(s) within the  areas targeted by this assay, and inadequate number of viral copies  (<250 copies / mL). A negative result must be combined with clinical  observations, patient history, and epidemiological information. If result is POSITIVE SARS-CoV-2 target nucleic acids are DETECTED. The SARS-CoV-2 RNA is generally detectable in upper and lower  respiratory specimens dur ing the acute phase of infection.  Positive  results are indicative of active infection with SARS-CoV-2.  Clinical  correlation with patient history and other diagnostic information is  necessary to determine patient infection status.  Positive results do  not rule out bacterial infection or co-infection with other viruses. If result is PRESUMPTIVE POSTIVE SARS-CoV-2 nucleic acids MAY BE PRESENT.   A presumptive positive result was obtained on the submitted specimen  and confirmed on repeat testing.  While 2019 novel coronavirus  (SARS-CoV-2) nucleic acids may be present in the submitted sample  additional confirmatory testing may be necessary for epidemiological  and / or clinical management purposes  to differentiate between  SARS-CoV-2 and other Sarbecovirus currently known to infect humans.  If clinically indicated additional testing with an alternate test  methodology [phone]) is advised. The SARS-CoV-2 RNA is generally  detectable in upper and lower respiratory sp ecimens during  the acute  phase of infection. The expected result is Negative. Fact Sheet for Patients:  StrictlyIdeas.no Fact Sheet for Healthcare Providers: BankingDealers.co.za This test is not yet approved or cleared by the [place] FDA and has been authorized for detection and/or diagnosis of SARS-CoV-2 by FDA under an Emergency Use Authorization (EUA).  This EUA will remain in effect (meaning this test can be used) for the duration of the COVID-19 declaration under Section 564(b)(1) of the Act, 21 U.S.C.  section 360bbb-3(b)(1), unless the authorization is terminated or revoked sooner. Performed at [facility], [address], [place], [place] [address]   Blood culture (routine x 2)     Status: None   Collection Time: 12/20/18  2:08 PM   Specimen: BLOOD RIGHT HAND  Result Value Ref Range Status   Specimen Description BLOOD RIGHT HAND  Final   Special Requests   Final    BOTTLES DRAWN AEROBIC AND ANAEROBIC Blood Culture adequate volume   Culture   Final    NO GROWTH 5 DAYS Performed at [facility], [address], [place], [place] [address]    Report Status 12/25/2018 FINAL  Final  Blood culture (routine x 2)     Status: None   Collection Time: 12/20/18  2:09 PM   Specimen: BLOOD RIGHT HAND  Result Value Ref Range Status   Specimen Description BLOOD RIGHT HAND  Final   Special Requests   Final    BOTTLES DRAWN AEROBIC AND ANAEROBIC Blood Culture adequate volume   Culture   Final    NO GROWTH 5 DAYS Performed at [facility], [address], [place], [place] [address]    Report Status 12/25/2018 FINAL  Final  MRSA PCR Screening     Status: None   Collection Time: 12/21/18  8:10 AM   Specimen: Nasal Mucosa; Nasopharyngeal  Result Value Ref Range Status   MRSA by PCR NEGATIVE NEGATIVE Final    Comment:        The GeneXpert MRSA Assay (FDA approved for NASAL specimens only), is one component of a comprehensive MRSA  colonization surveillance program. It is not intended to diagnose MRSA infection nor to guide or monitor treatment for MRSA infections. Performed at [facility], [address] [place], [place] [address]   Sputum culture     Status: None   Collection Time: 12/22/18 12:36 AM   Specimen: Expectorated Sputum  Result Value Ref Range Status   Specimen Description EXPECTORATED SPUTUM  Final   Special Requests NONE  Final   Sputum evaluation   Final    THIS SPECIMEN IS ACCEPTABLE FOR SPUTUM CULTURE Performed at [facility], [address], [place], [place] [address]    Report Status 12/22/2018 FINAL  Final  Culture, respiratory     Status: None   Collection Time: 12/22/18 12:36 AM  Result Value Ref Range Status   Specimen Description   Final    EXPECTORATED SPUTUM Performed at [facility], [address], [place], [place] [address]    Special Requests   Final    NONE Reflexed from [id number] Performed at [facility], [address], [place], [place] [address]    Gram Stain NO WBC SEEN NO ORGANISMS SEEN   Final   Culture   Final    FEW Consistent with normal respiratory flora. Performed at [facility], [place] [address], [place], [place] [address]    Report Status 12/24/2018 FINAL  Final   Studies/Results: No results found.  Medications:  Prior to Admission:  Medications Prior to Admission  Medication Sig Dispense Refill Last Dose  . aspirin EC 81 MG tablet Take 81 mg by mouth daily.   12/19/2018 at 900  . cabergoline (DOSTINEX) 0.5 MG tablet Take 0.5 tablets (0.25 mg total) by mouth 2 (two) times a week. 10 tablet 2 12/19/2018 at Unknown time  . clopidogrel (PLAVIX) 75 MG tablet Take 1 tablet (75 mg total) by mouth daily. 30 tablet 0 12/19/2018 at 900  .  estradiol (ESTRACE) 2 MG tablet TAKE 1 TABLET BY MOUTH ONCE DAILY. 30 tablet 11 12/19/2018 at Unknown time  . Icosapent Ethyl (VASCEPA) 1 G CAPS Take 2 g by mouth 2 (two) times daily.    12/19/2018 at Unknown time  . lansoprazole  (PREVACID) 30 MG capsule TAKE 1 CAPSULE IN THE MORNING (1 HOUR BEFORE BREAKFAST) 30 capsule 5 12/19/2018 at Unknown time  . levothyroxine (SYNTHROID) 50 MCG tablet Take 1 tablet (50 mcg total) by mouth daily. 90 tablet 1 12/19/2018 at Unknown time  . Multiple Vitamin (MULTIVITAMIN) tablet Take 1 tablet by mouth daily.   12/19/2018 at Unknown time  . rosuvastatin (CRESTOR) 20 MG tablet Take 20 mg by mouth daily.   12/19/2018 at Unknown time  . naproxen sodium (ALEVE) 220 MG tablet Take 220 mg by mouth daily as needed (pain).     . ranitidine (ZANTAC) 300 MG tablet TAKE ONE TABLET BY MOUTH AT BEDTIME. (Patient not taking: Reported on 12/20/2018) 30 tablet 5 Not Taking at Unknown time   Scheduled: . aspirin EC  81 mg Oral Daily  . cabergoline  0.25 mg Oral 2 times weekly  . clopidogrel  75 mg Oral Daily  . enoxaparin (LOVENOX) injection  40 mg Subcutaneous Q24H  . estradiol  2 mg Oral QHS  . famotidine  20 mg Oral BID  . feeding supplement (ENSURE ENLIVE)  237 mL Oral BID BM  . guaiFENesin  1,200 mg Oral BID  . levothyroxine  50 mcg Oral Daily  . lidocaine  1 patch Transdermal Q24H  . metroNIDAZOLE  500 mg Oral Q8H  . multivitamin with minerals  1 tablet Oral Daily  . potassium chloride  40 mEq Oral TID  . predniSONE  10 mg Oral Q breakfast   Continuous: . sodium chloride 250 mL (12/24/18 1459)  . ceFEPime (MAXIPIME) IV 2 g (12/26/18 0512)   [id number] chloride, acetaminophen, alum & mag hydroxide-simeth, HYDROmorphone (DILAUDID) injection, ipratropium-albuterol, ondansetron (ZOFRAN) IV, simethicone, sodium chloride  Assesment: She has pneumonia in the right lung.  She had been improving steadily but has had trouble since she got choked yesterday.  Since her pneumonia had a cavity I think it is possible that she has a pneumothorax since she has chest pain and shortness of breath again.  Will check portable chest x-ray to see  She has COPD at baseline she is on low-dose prednisone  She has acute  hypoxic respiratory failure and I think she will require home oxygen briefly Principal Problem:   RLL pneumonia ([place]) Active Problems:   Carotid artery disease (HCC)   Hyperlipidemia   GERD (gastroesophageal reflux disease)   Stroke (H[facility])   Tobacco abuse   Acute hypoxemic respiratory failure ([place])    Plan: Check chest x-ray.  Continue other treatments.    LOS: 5 days   Alonza Bogus 12/26/2018, 7:54 AM

## 2018-12-26 NOTE — Progress Notes (Signed)
PROGRESS NOTE    [name]  [id number] DOB: November 28, 1970 DOA: 12/20/2018 PCP: Tonya Radon, NP   Brief Narrative:  Per HPI: [name] a 48 y.o.femalewith past medical history significant for gastroesophageal reflux disease, hyperlipidemia, tobacco abuse, hypothyroidism,carotid artery disease and previous stroke (without significant residual deficit);who presented to the emergency department secondary to right-sided chest discomfort, shortness of breath and coughing spells. Patient reports significant nonproductive cough over the last 3 days and on the day of admission reports difficulty catching her breath and feeling short of breath. Patient denies any specific chest pain, no nausea, no vomiting, no headaches, no blurred vision, no focal weakness, no dysuria, no hematuria, no melena, no sick contacts or any other complaints. In the ED chest x-ray and CT chest demonstrated right lower lobe pneumonia;patient with WBCs in the 20,000 range) chronically with leukocytosis in the 12-16Krange),patient found to be febrile and experiencing tachypnea with significant right-sided chest discomfort. Cultures were taken, IV fluids initiated and IV antibiotics given. [facility]H has been called on the patient for further evaluation and management.  9/9:Patient noted to have significant right-sided chest pain that has been persistent since admission. She does not appear to be responding well to IV Levaquin and will switch to IV Zosyn today after discussion with pulmonology. We will also add IV steroids to assist with symptoms. Will likely require thoracentesis in the outpatient setting. Continue pain medications.  9/10: Worsening leukocytosis as well as lactic acidosis noted this morning. Clinically patient appears stable. Fluid bolus initiated for lactic acidosis today and will monitor blood pressures and repeat lactic acid a.m. Pulmonology consulted with recommendations appreciated to  include reinitiation of Levaquin until Legionella antigen returns.  9/11:Patient continues have persistent leukocytosis as well as increased lactic acidosis this morning. Clinically she is looking better, however. Plans to continue current antibiotics with Legionella antigen pending. Continue on IV fluid at higher rate today.  9/12:Laboratory data much improved this morning with lactic acidosis resolved. Patient is clinically feeling better and will attempt to wean oxygen. Legionella antigen negative and Levaquin will be discontinued. Transition to oral antibiotics today. Discontinue IV fluid. Continue to monitor a.m. labs and maintain on cefepime and Flagyl.  9/13: Laboratory data is beginning to stabilize, and patient is on 3 L nasal cannula.  Likely will need to go home on home oxygen.  She continues to feel well, but does have some worsening anxiety for which prednisone dose has been cut by pulmonology.  9/14: Laboratory data stable, however patient feels as though she choked on some water yesterday.  She has had poor appetite and general and states that she feels weak and more short of breath once again.  Potassium levels are very low this morning.  Chest x-ray ordered by pulmonology with new findings of possible bilateral effusions.   Assessment & Plan:   Principal Problem:   RLL pneumonia ([place]) Active Problems:   Carotid artery disease ([place])   Hyperlipidemia   GERD (gastroesophageal reflux disease)   Stroke ([place])   Tobacco abuse   Acute hypoxemic respiratory failure ([place])   1-acute hypoxemic respiratory failuresecondary toRLL pneumoniawith associated abscessand worsening lactic acidosis -Continue IV cefepimeand Flagyl for now with Levaquin discontinued on 9/12 as Legionella antigen negative. -Blood cultures and urine culture with no growth thus far. -Prednisone dose cut to half on account of some anxiety on 9/13 -Flutter valve/incentive spirometer and the use of  DuoNeb -follow clinical response -Will require thoracentesis inpatient versus outpatient based on therapeutic response -  We will likely require home oxygen temporarily -Continue pain management with Lidoderm patch. -Chest x-ray with some new effusions noted -We will plan to start some Xanax as needed for anxiety today  2-hyperlipidemia -Hold Lovaza and statins with LDL noted to be 28  3-hypothyroidism -Continue Synthroid. -Normal TSH recently checked  4-GERD -will use Pepcid  5-hx of stroke/carotid artery disease -continue aspirin, plavix and statins  -no acute neurologic Deficit reported.  6-tobacco abusewith likely early COPD -cessation counseling provided -Patient declining nicotine patch  7-hypokalemia-recurrent and severe -Replete today orally and magnesium noted to be 1.8 -Recheck labs in a.m.   DVT prophylaxis:Lovenox Code Status:Full Family Communication:None at bedside Disposition Plan:Continue current treatments as ordered and monitor carefully.  Consultants:  Pulmonology  Procedures:  None  Antimicrobials:  Anti-infectives (From admission, onward)   Start     Dose/Rate Route Frequency Ordered Stop   12/25/18 1400  metroNIDAZOLE (FLAGYL) tablet 500 mg     500 mg Oral Every 8 hours 12/25/18 0905     12/22/18 0930  levofloxacin (LEVAQUIN) IVPB 500 mg  Status:  Discontinued     500 mg 100 mL/hr over 60 Minutes Intravenous Every 24 hours 12/22/18 0829 12/24/18 0755   12/21/18 1300  ceFEPIme (MAXIPIME) 2 g in sodium chloride 0.9 % 100 mL IVPB     2 g 200 mL/hr over 30 Minutes Intravenous Every 8 hours 12/21/18 1254     12/21/18 1000  ceFEPIme (MAXIPIME) 2 g in sodium chloride 0.9 % 100 mL IVPB  Status:  Discontinued     2 g 200 mL/hr over 30 Minutes Intravenous Every 8 hours 12/21/18 0953 12/21/18 1254   12/21/18 1000  metroNIDAZOLE (FLAGYL) IVPB 500 mg  Status:  Discontinued     500 mg 100 mL/hr over 60 Minutes Intravenous Every 8 hours  12/21/18 0953 12/25/18 0905   12/20/18 1353  levofloxacin (LEVAQUIN) IVPB 750 mg  Status:  Discontinued     750 mg 100 mL/hr over 90 Minutes Intravenous Every 24 hours 12/20/18 1353 12/21/18 0810   12/20/18 1330  levofloxacin (LEVAQUIN) IVPB 500 mg  Status:  Discontinued     500 mg 100 mL/hr over 60 Minutes Intravenous  Once 12/20/18 1318 12/20/18 1358       Subjective: Patient seen and evaluated today with worsening shortness of breath after she noticed "something went down the wrong way" yesterday after eating.  She had fits of coughing that led to severe chest wall pain.  And continues to have some shortness of breath this morning.  She is currently on 3 L nasal cannula.  Objective: Vitals:   12/26/18 0436 12/26/18 0553 12/26/18 0555 12/26/18 0807  BP: (!) 75/46 (!) 85/58 (!) 89/59   Pulse: 73 82 82   Resp:      Temp: 98.2 F (36.8 C)     TempSrc: Oral     SpO2: 94%   92%  Weight:      Height:        Intake/Output Summary (Last 24 hours) at 12/26/2018 1027 Last data filed at 12/26/2018 1000 Gross per 24 hour  Intake 1920 ml  Output 7000 ml  Net -5080 ml   Filed Weights   12/20/18 0957 12/20/18 1441  Weight: 49.9 kg 46.2 kg    Examination:  General exam: Appears calm and comfortable  Respiratory system: Clear to auscultation. Respiratory effort normal.  3 L nasal cannula. Cardiovascular system: S1 & S2 heard, RRR. No JVD, murmurs, rubs, gallops or clicks. No  pedal edema. Gastrointestinal system: Abdomen is nondistended, soft and nontender. No organomegaly or masses felt. Normal bowel sounds heard. Central nervous system: Alert and oriented. No focal neurological deficits. Extremities: Symmetric 5 x 5 power. Skin: No rashes, lesions or ulcers Psychiatry: Mild to moderate anxiety noted.    Data Reviewed: I have personally reviewed following labs and imaging studies  CBC: Recent Labs  Lab 12/20/18 1117  12/22/18 0614 12/23/18 0633 12/24/18 0623 12/25/18 0603  12/26/18 0417  WBC 20.5*   < > 37.1* 38.5* 24.2* 25.8* 24.7*  NEUTROABS 17.9*  --  33.8*  --   --   --   --   HGB 10.8*   < > 10.1* 10.0* 9.3* 10.2* 10.6*  HCT 34.4*   < > 32.0* 31.7* 29.5* 33.1* 33.0*  MCV 94.8   < > 96.1 96.4 94.9 96.2 93.2  PLT 663*   < > 699* 745* 679* 791* 697*   < > = values in this interval not displayed.   Basic Metabolic Panel: Recent Labs  Lab 12/20/18 1117 12/21/18 0517 12/22/18 0614 12/23/18 0633 12/24/18 0623 12/26/18 0417  NA 136 132* 138 144 144 137  K 3.9 3.4* 4.4 4.2 3.9 2.6*  CL 98 97* 107 111 110 99  CO2 27 26 22 24 26 29   GLUCOSE 112* 85 174* 193* 152* 114*  BUN <5* 8 8 10 12 8   CREATININE 0.53 0.62 0.50 0.59 0.52 0.42*  CALCIUM 8.5* 7.6* 8.5* 8.6* 8.5* 7.8*  MG 1.4*  --  2.0  --   --  1.8   GFR: Estimated Creatinine Clearance: 62.7 mL/min (A) (by C-G formula based on SCr of 0.42 mg/dL (L)). Liver Function Tests: Recent Labs  Lab 12/20/18 1117  AST 14*  ALT 10  ALKPHOS 92  BILITOT 0.6  PROT 7.5  ALBUMIN 3.0*   No results for input(s): LIPASE, AMYLASE in the last 168 hours. No results for input(s): AMMONIA in the last 168 hours. Coagulation Profile: No results for input(s): INR, PROTIME in the last 168 hours. Cardiac Enzymes: No results for input(s): CKTOTAL, CKMB, CKMBINDEX, TROPONINI in the last 168 hours. BNP (last 3 results) No results for input(s): PROBNP in the last 8760 hours. HbA1C: No results for input(s): HGBA1C in the last 72 hours. CBG: Recent Labs  Lab 12/26/18 0155  GLUCAP 95   Lipid Profile: No results for input(s): CHOL, HDL, LDLCALC, TRIG, CHOLHDL, LDLDIRECT in the last 72 hours. Thyroid Function Tests: No results for input(s): TSH, T4TOTAL, FREET4, T3FREE, THYROIDAB in the last 72 hours. Anemia Panel: No results for input(s): VITAMINB12, FOLATE, FERRITIN, TIBC, IRON, RETICCTPCT in the last 72 hours. Sepsis Labs: Recent Labs  Lab 12/23/18 [id number] 12/24/18 0623 12/25/18 0603 12/26/18 0417   LATICACIDVEN 2.9* 0.9 1.5 0.8    Recent Results (from the past 240 hour(s))  SARS Coronavirus 2 [facility] order, Performed in [facility] hospital lab) Nasopharyngeal Nasopharyngeal Swab     Status: None   Collection Time: 12/20/18 10:18 AM   Specimen: Nasopharyngeal Swab  Result Value Ref Range Status   SARS Coronavirus 2 NEGATIVE NEGATIVE Final    Comment: (NOTE) If result is NEGATIVE SARS-CoV-2 target nucleic acids are NOT DETECTED. The SARS-CoV-2 RNA is generally detectable in upper and lower  respiratory specimens during the acute phase of infection. The lowest  concentration of SARS-CoV-2 viral copies this assay can detect is 250  copies / mL. A negative result does not preclude SARS-CoV-2 infection  and should not be used  as the sole basis for treatment or other  patient management decisions.  A negative result may occur with  improper specimen collection / handling, submission of specimen other  than nasopharyngeal swab, presence of viral mutation(s) within the  areas targeted by this assay, and inadequate number of viral copies  (<250 copies / mL). A negative result must be combined with clinical  observations, patient history, and epidemiological information. If result is POSITIVE SARS-CoV-2 target nucleic acids are DETECTED. The SARS-CoV-2 RNA is generally detectable in upper and lower  respiratory specimens dur ing the acute phase of infection.  Positive  results are indicative of active infection with SARS-CoV-2.  Clinical  correlation with patient history and other diagnostic information is  necessary to determine patient infection status.  Positive results do  not rule out bacterial infection or co-infection with other viruses. If result is PRESUMPTIVE POSTIVE SARS-CoV-2 nucleic acids MAY BE PRESENT.   A presumptive positive result was obtained on the submitted specimen  and confirmed on repeat testing.  While 2019 novel coronavirus  (SARS-CoV-2) nucleic acids may  be present in the submitted sample  additional confirmatory testing may be necessary for epidemiological  and / or clinical management purposes  to differentiate between  SARS-CoV-2 and other Sarbecovirus currently known to infect humans.  If clinically indicated additional testing with an alternate test  methodology [phone]) is advised. The SARS-CoV-2 RNA is generally  detectable in upper and lower respiratory sp ecimens during the acute  phase of infection. The expected result is Negative. Fact Sheet for Patients:  StrictlyIdeas.no Fact Sheet for Healthcare Providers: BankingDealers.co.za This test is not yet approved or cleared by the [place] FDA and has been authorized for detection and/or diagnosis of SARS-CoV-2 by FDA under an Emergency Use Authorization (EUA).  This EUA will remain in effect (meaning this test can be used) for the duration of the COVID-19 declaration under Section 564(b)(1) of the Act, 21 U.S.C. section 360bbb-3(b)(1), unless the authorization is terminated or revoked sooner. Performed at [facility], [address] [place], [place] [address]   Blood culture (routine x 2)     Status: None   Collection Time: 12/20/18  2:08 PM   Specimen: BLOOD RIGHT HAND  Result Value Ref Range Status   Specimen Description BLOOD RIGHT HAND  Final   Special Requests   Final    BOTTLES DRAWN AEROBIC AND ANAEROBIC Blood Culture adequate volume   Culture   Final    NO GROWTH 5 DAYS Performed at [facility], [address], [place], [place] [address]    Report Status 12/25/2018 FINAL  Final  Blood culture (routine x 2)     Status: None   Collection Time: 12/20/18  2:09 PM   Specimen: BLOOD RIGHT HAND  Result Value Ref Range Status   Specimen Description BLOOD RIGHT HAND  Final   Special Requests   Final    BOTTLES DRAWN AEROBIC AND ANAEROBIC Blood Culture adequate volume   Culture   Final    NO GROWTH 5 DAYS Performed  at [facility], [address], [place], [place] [address]    Report Status 12/25/2018 FINAL  Final  MRSA PCR Screening     Status: None   Collection Time: 12/21/18  8:10 AM   Specimen: Nasal Mucosa; Nasopharyngeal  Result Value Ref Range Status   MRSA by PCR NEGATIVE NEGATIVE Final    Comment:        The GeneXpert MRSA Assay (FDA approved for NASAL  specimens only), is one component of a comprehensive MRSA colonization surveillance program. It is not intended to diagnose MRSA infection nor to guide or monitor treatment for MRSA infections. Performed at [facility], [address], [place], [place] [address]   Sputum culture     Status: None   Collection Time: 12/22/18 12:36 AM   Specimen: Expectorated Sputum  Result Value Ref Range Status   Specimen Description EXPECTORATED SPUTUM  Final   Special Requests NONE  Final   Sputum evaluation   Final    THIS SPECIMEN IS ACCEPTABLE FOR SPUTUM CULTURE Performed at [facility], [address] [place], [place] [address]    Report Status 12/22/2018 FINAL  Final  Culture, respiratory     Status: None   Collection Time: 12/22/18 12:36 AM  Result Value Ref Range Status   Specimen Description   Final    EXPECTORATED SPUTUM Performed at [facility], [address], [place], [place] [address]    Special Requests   Final    NONE Reflexed from [phone] Performed at [facility], [address], [place], [place] [address]    Gram Stain NO WBC SEEN NO ORGANISMS SEEN   Final   Culture   Final    FEW Consistent with normal respiratory flora. Performed at [facility], [place] [address] [place], [place] [address]    Report Status 12/24/2018 FINAL  Final         Radiology Studies: Dg Chest Port 1 View  Result Date: 12/26/2018 CLINICAL DATA:  Pneumonia EXAM: PORTABLE CHEST 1 VIEW COMPARISON:  12/20/2018 FINDINGS: There are new small, right greater than left bilateral pleural effusions and associated atelectasis or consolidation. A  masslike density remains about the peripheral right midlung. Mild cardiomegaly. IMPRESSION: There are new small, right greater than left bilateral pleural effusions and associated atelectasis or consolidation. A masslike density remains about the peripheral right midlung. Electronically Signed   By: Eddie Candle M.D.   On: 12/26/2018 09:26        Scheduled Meds: . aspirin EC  81 mg Oral Daily  . cabergoline  0.25 mg Oral 2 times weekly  . clopidogrel  75 mg Oral Daily  . enoxaparin (LOVENOX) injection  40 mg Subcutaneous Q24H  . estradiol  2 mg Oral QHS  . famotidine  20 mg Oral BID  . feeding supplement (ENSURE ENLIVE)  237 mL Oral BID BM  . guaiFENesin  1,200 mg Oral BID  . levothyroxine  50 mcg Oral Daily  . lidocaine  1 patch Transdermal Q24H  . metroNIDAZOLE  500 mg Oral Q8H  . multivitamin with minerals  1 tablet Oral Daily  . potassium chloride  40 mEq Oral TID  . predniSONE  10 mg Oral Q breakfast   Continuous Infusions: . sodium chloride 250 mL (12/24/18 1459)  . ceFEPime (MAXIPIME) IV 2 g (12/26/18 0512)     LOS: 5 days    Time spent: 30 minutes    [name] Darleen Crocker, DO [facility] Pager [phone]  If 7PM-7AM, please contact night-coverage [email or web address] Password [facility] 12/26/2018, 10:27 AM

## 2018-12-26 NOTE — Plan of Care (Signed)
  Problem: Education: Goal: Knowledge of General Education information will improve Description Including pain rating scale, medication(s)/side effects and non-pharmacologic comfort measures Outcome: Progressing   

## 2018-12-27 ENCOUNTER — Other Ambulatory Visit: Payer: Self-pay

## 2018-12-27 ENCOUNTER — Telehealth: Payer: Self-pay | Admitting: "Endocrinology

## 2018-12-27 LAB — BASIC METABOLIC PANEL
Anion gap: 8 (ref 5–15)
BUN: 8 mg/dL (ref 6–20)
CO2: 25 mmol/L (ref 22–32)
Calcium: 8.2 mg/dL — ABNORMAL LOW (ref 8.9–10.3)
Chloride: 107 mmol/L (ref 98–111)
Creatinine, Ser: 0.51 mg/dL (ref 0.44–1.00)
GFR calc Af Amer: >60 mL/min (ref >60)
GFR calc non Af Amer: >60 mL/min (ref >60)
Glucose, Bld: 112 mg/dL — ABNORMAL HIGH (ref 70–99)
Potassium: 4.2 mmol/L (ref 3.5–5.1)
Sodium: 140 mmol/L (ref 135–145)

## 2018-12-27 LAB — MAGNESIUM: Magnesium: 2 mg/dL (ref 1.7–2.4)

## 2018-12-27 LAB — CBC
HCT: 32.3 % — ABNORMAL LOW (ref 36.0–46.0)
Hemoglobin: 10.4 g/dL — ABNORMAL LOW (ref 12.0–15.0)
MCH: 30.1 pg (ref 26.0–34.0)
MCHC: 32.2 g/dL (ref 30.0–36.0)
MCV: 93.4 fL (ref 80.0–100.0)
Platelets: 657 10*3/uL — ABNORMAL HIGH (ref 150–400)
RBC: 3.46 MIL/uL — ABNORMAL LOW (ref 3.87–5.11)
RDW: 12.9 % (ref 11.5–15.5)
WBC: 25.3 10*3/uL — ABNORMAL HIGH (ref 4.0–10.5)
nRBC: 0 % (ref 0.0–0.2)

## 2018-12-27 LAB — LACTIC ACID, PLASMA: Lactic Acid, Venous: 1.2 mmol/L (ref 0.5–1.9)

## 2018-12-27 MED ORDER — CABERGOLINE 0.5 MG PO TABS: 0.2500 mg | ORAL_TABLET | ORAL | 2 refills | Status: DC

## 2018-12-27 MED ORDER — ALBUTEROL SULFATE HFA 108 (90 BASE) MCG/ACT IN AERS: 2.0000 | INHALATION_SPRAY | Freq: Four times a day (QID) | RESPIRATORY_TRACT | 2 refills | Status: DC | PRN

## 2018-12-27 MED ORDER — CEFDINIR 300 MG PO CAPS: 300.0000 mg | ORAL_CAPSULE | Freq: Two times a day (BID) | ORAL | 0 refills | Status: AC

## 2018-12-27 MED ORDER — METRONIDAZOLE 500 MG PO TABS: 500.0000 mg | ORAL_TABLET | Freq: Three times a day (TID) | ORAL | 0 refills | Status: AC

## 2018-12-27 MED ORDER — LIDOCAINE 5 % EX PTCH: 1.0000 | MEDICATED_PATCH | CUTANEOUS | 0 refills | Status: DC

## 2018-12-27 MED ORDER — GUAIFENESIN ER 600 MG PO TB12: 1200.0000 mg | ORAL_TABLET | Freq: Two times a day (BID) | ORAL | 0 refills | Status: AC

## 2018-12-27 NOTE — TOC Transition Note (Signed)
Transition of Care [facility]) - CM/SW Discharge Note   Patient Details  Name: [name] MRN: [id number] Date of Birth: 1971-01-08  [facility] CM/SW Contact:  Tanav Orsak, Chauncey Reading, RN Phone Number: 12/27/2018, 1:11 PM  Clinical Narrative:   Patient discharging today.  Qualifies for home oxygen. Discussed DME options. Patient has no preference on providers, wants who ever can get tank here fastest.   Juliann Pulse of [employer] onsite. Will deliver tank to room.       Discharge Plan and Services   Discharge Planning Services: CM Consult            DME Arranged: Oxygen DME Agency: [employer]            Readmission Risk Interventions No flowsheet data found.

## 2018-12-27 NOTE — Progress Notes (Signed)
SATURATION QUALIFICATIONS: (This note is used to comply with regulatory documentation for home oxygen)  Patient Saturations on Room Air at Rest = 94%  Patient Saturations on Room Air while Ambulating = 85%  Patient Saturations on 3 Liters of oxygen while Ambulating = 95%  Please briefly explain why patient needs home oxygen:  Patient is short of breath when ambulating.

## 2018-12-27 NOTE — Discharge Summary (Signed)
Physician Discharge Summary  [name] [id number] DOB: 1971-03-17 DOA: 12/20/2018  PCP: Sander Radon, NP  Admit date: 12/20/2018  Discharge date: 12/27/2018  Admitted From:Home  Disposition:  Home  Recommendations for Outpatient Follow-up:  1. Follow up with PCP in 1-2 weeks 2. Follow-up with Dr. Luan Pulling as recommended in the next 2 weeks for reevaluation and CT chest imaging 3. Continue on Flagyl and Omnicef as prescribed for 2 more weeks to complete total 3-week course of treatment 4. No further prednisone required on discharge 5. Patient will be set up with home oxygen 3 L continuous 6. Albuterol as needed for shortness of breath or wheezing 7. Lidocaine patch for chest wall pain prescribed  Home Health: None  Equipment/Devices: Home 3 L nasal cannula  Discharge Condition: Stable  CODE STATUS: Full  Diet recommendation: Heart Healthy  Brief/Interim Summary:Per HPI: [name] a 48 y.o.femalewith past medical history significant for gastroesophageal reflux disease, hyperlipidemia, tobacco abuse, hypothyroidism,carotid artery disease and previous stroke (without significant residual deficit);who presented to the emergency department secondary to right-sided chest discomfort, shortness of breath and coughing spells. Patient reports significant nonproductive cough over the last 3 days and on the day of admission reports difficulty catching her breath and feeling short of breath. Patient denies any specific chest pain, no nausea, no vomiting, no headaches, no blurred vision, no focal weakness, no dysuria, no hematuria, no melena, no sick contacts or any other complaints. In the ED chest x-ray and CT chest demonstrated right lower lobe pneumonia;patient with WBCs in the 20,000 range) chronically with leukocytosis in the 12-16Krange),patient found to be febrile and experiencing tachypnea with significant right-sided chest discomfort. Cultures were taken, IV fluids  initiated and IV antibiotics given. [facility] has been called on the patient for further evaluation and management.  9/9:Patient noted to have significant right-sided chest pain that has been persistent since admission. She does not appear to be responding well to IV Levaquin and will switch to IV Zosyn today after discussion with pulmonology. We will also add IV steroids to assist with symptoms. Will likely require thoracentesis in the outpatient setting. Continue pain medications.  9/10: Worsening leukocytosis as well as lactic acidosis noted this morning. Clinically patient appears stable. Fluid bolus initiated for lactic acidosis today and will monitor blood pressures and repeat lactic acid a.m. Pulmonology consulted with recommendations appreciated to include reinitiation of Levaquin until Legionella antigen returns.  9/11:Patient continues have persistent leukocytosis as well as increased lactic acidosis this morning. Clinically she is looking better, however. Plans to continue current antibiotics with Legionella antigen pending. Continue on IV fluid at higher rate today.  9/12:Laboratory data much improved this morning with lactic acidosis resolved. Patient is clinically feeling better and will attempt to wean oxygen. Legionella antigen negative and Levaquin will be discontinued. Transition to oral antibiotics today. Discontinue IV fluid. Continue to monitor a.m. labs and maintain on cefepime and Flagyl.  9/13:Laboratory data is beginning to stabilize, and patient is on 3 L nasal cannula. Likely will need to go home on home oxygen. She continues to feel well, but does have some worsening anxiety for which prednisone dose has been cut by pulmonology.  9/14: Laboratory data stable, however patient feels as though she choked on some water yesterday.  She has had poor appetite and general and states that she feels weak and more short of breath once again.  Potassium levels are  very low this morning.  Chest x-ray ordered by pulmonology with new findings of  possible bilateral effusions.  9/15: Patient stable for discharge today as she is clinically much improved.  Laboratory data demonstrates stability despite persistent leukocytosis.  No further prednisone will be given on discharge and she will complete course of treatment with Omnicef and Flagyl as prescribed for 2 more weeks per pulmonology recommendations.  She will need close follow-up with Dr. Luan Pulling in 2 weeks for repeat CT chest to re-assess cavitary lesion/abscess for possible thoracentesis as needed.  She will require 3 L nasal cannula at home which she will be discharged with.  She will also be given albuterol and lidocaine patch as noted above for symptomatic management.   Discharge Diagnoses:  Principal Problem:   RLL pneumonia ([place]) Active Problems:   Carotid artery disease ([place])   Hyperlipidemia   GERD (gastroesophageal reflux disease)   Stroke ([place])   Tobacco abuse   Acute hypoxemic respiratory failure ([place])  Principal discharge diagnosis: Acute hypoxemic respiratory failure secondary to right lower lobe pneumonia with associated abscess/cavitary lesion.  Discharge Instructions  Discharge Instructions    Diet - low sodium heart healthy   Complete by: As directed    Increase activity slowly   Complete by: As directed      Allergies as of 12/27/2018      Reactions   Pantoprazole Sodium Itching, Rash   Penicillins Itching, Rash   Has patient had a PCN reaction causing immediate rash, facial/tongue/throat swelling, SOB or lightheadedness with hypotension: Yes Has patient had a PCN reaction causing severe rash involving mucus membranes or skin necrosis: No Has patient had a PCN reaction that required hospitalization: No Has patient had a PCN reaction occurring within the last 10 years: No If all of the above answers are "NO", then may proceed with Cephalosporin use.   Sulfa Antibiotics  Itching, Rash   Codeine Nausea And Vomiting   Demerol [meperidine]    Red streaks on arm after coadministration of phenergan and demerol at time of EGD in 2012. Tolerated phenergan with subsequent EGD.      Medication List    TAKE these medications   albuterol 108 (90 Base) MCG/ACT inhaler Commonly known as: VENTOLIN HFA Inhale 2 puffs into the lungs every 6 (six) hours as needed for wheezing or shortness of breath.   aspirin EC 81 MG tablet Take 81 mg by mouth daily.   cabergoline 0.5 MG tablet Commonly known as: DOSTINEX Take 0.5 tablets (0.25 mg total) by mouth 2 (two) times a week.   cefdinir 300 MG capsule Commonly known as: OMNICEF Take 1 capsule (300 mg total) by mouth 2 (two) times daily for 14 days.   clopidogrel 75 MG tablet Commonly known as: PLAVIX Take 1 tablet (75 mg total) by mouth daily.   estradiol 2 MG tablet Commonly known as: ESTRACE TAKE 1 TABLET BY MOUTH ONCE DAILY.   guaiFENesin 600 MG 12 hr tablet Commonly known as: MUCINEX Take 2 tablets (1,200 mg total) by mouth 2 (two) times daily for 15 days.   lansoprazole 30 MG capsule Commonly known as: PREVACID TAKE 1 CAPSULE IN THE MORNING (1 HOUR BEFORE BREAKFAST)   levothyroxine 50 MCG tablet Commonly known as: SYNTHROID Take 1 tablet (50 mcg total) by mouth daily.   lidocaine 5 % Commonly known as: LIDODERM Place 1 patch onto the skin daily. Remove & Discard patch within 12 hours or as directed by MD Start taking on: December 28, 2018   metroNIDAZOLE 500 MG tablet Commonly known as: FLAGYL Take 1 tablet (500 mg  total) by mouth 3 (three) times daily for 14 days.   multivitamin tablet Take 1 tablet by mouth daily.   naproxen sodium 220 MG tablet Commonly known as: ALEVE Take 220 mg by mouth daily as needed (pain).   ranitidine 300 MG tablet Commonly known as: ZANTAC TAKE ONE TABLET BY MOUTH AT BEDTIME.   rosuvastatin 20 MG tablet Commonly known as: CRESTOR Take 20 mg by mouth  daily.   Vascepa 1 g Caps Generic drug: Icosapent Ethyl Take 2 g by mouth 2 (two) times daily.            Durable Medical Equipment  (From admission, onward)         Start     Ordered   12/27/18 1234  DME Oxygen  Once    Question Answer Comment  Length of Need 12 Months   Mode or (Route) Nasal cannula   Liters per Minute 3   Frequency Continuous (stationary and portable oxygen unit needed)   Oxygen conserving device Yes   Oxygen delivery system Gas      12/27/18 1235         Follow-up Information    Gill, Pushpinder K, NP Follow up in 1 week(s).   Specialty: Family Medicine Contact information: [place] [address] [phone]        Sinda Du, MD Follow up in 2 week(s).   Specialty: Pulmonary Disease Contact information: [address] [place] [address] [phone]          Allergies  Allergen Reactions  . Pantoprazole Sodium Itching and Rash  . Penicillins Itching and Rash    Has patient had a PCN reaction causing immediate rash, facial/tongue/throat swelling, SOB or lightheadedness with hypotension: Yes Has patient had a PCN reaction causing severe rash involving mucus membranes or skin necrosis: No Has patient had a PCN reaction that required hospitalization: No Has patient had a PCN reaction occurring within the last 10 years: No If all of the above answers are "NO", then may proceed with Cephalosporin use.   . Sulfa Antibiotics Itching and Rash  . Codeine Nausea And Vomiting  . Demerol [Meperidine]     Red streaks on arm after coadministration of phenergan and demerol at time of EGD in 2012. Tolerated phenergan with subsequent EGD.    Consultations:  Pulmonology   Procedures/Studies: Dg Ribs Unilateral W/chest Right  Result Date: 12/20/2018 CLINICAL DATA:  Chest pain EXAM: RIGHT RIBS AND CHEST - 3+ VIEW COMPARISON:  Chest radiograph March 22, 2012 FINDINGS: Frontal chest as well as oblique and cone-down  rib images obtained. There is extensive consolidation in the right lower lung region. Lungs elsewhere are clear. Heart size and pulmonary vascularity are normal. No adenopathy. No rib fracture is evident. No destructive bone lesions. No pneumothorax or pleural effusion. IMPRESSION: Airspace opacity in the right lower lung region. Suspect pneumonia. An underlying mass is possible. Lungs elsewhere are clear. No bone lesion. No fracture. Followup PA and lateral radiographs recommended in 3-4 weeks following trial of antibiotic therapy to ensure resolution and exclude underlying malignancy. Electronically Signed   By: Lowella Grip III M.D.   On: 12/20/2018 11:06   Ct Chest Wo Contrast  Result Date: 12/20/2018 CLINICAL DATA:  Abnormal chest x-ray with cough for several days EXAM: CT CHEST WITHOUT CONTRAST TECHNIQUE: Multidetector CT imaging of the chest was performed following the standard protocol without IV contrast. COMPARISON:  Rib series from earlier in the same day. FINDINGS: Cardiovascular: Somewhat limited  due to lack of IV contrast. Atherosclerotic calcifications are noted although no aneurysmal dilatation is seen. No cardiac enlargement is seen. No pericardial effusion is noted. Mediastinum/Nodes: The esophagus is within normal limits. They were are mediastinal nodes identified. A 10 mm short axis node is noted in the precarinal region with a 16 mm short axis node in the subcarinal region. Lack of IV contrast limits evaluation of the hilum somewhat although a 17 mm short axis node is noted in the right infrahilar region best seen on image number 80 of series 2. There are likely more right hilar nodes present although evaluation is somewhat difficult. Lungs/Pleura: The lungs are well aerated bilaterally. Bridging the minor fissure on the right there is a prominent soft tissue mass which measures approximately 5.2 x 3.9 cm in greatest dimension. Focal area of cavitation is suggested best seen on image  number 69 of series 4. Some surrounding prominence of the interlobular septa is noted as well as some wedge-shaped infiltrate with occluded bronchi. Small right-sided pleural effusion is noted as well. The left lung is clear. Upper Abdomen: Visualized upper abdomen demonstrates the adrenal glands to be within normal limits. No other abnormality in the upper abdomen is seen. Musculoskeletal: Bony structures show mild degenerative change of the thoracic spine. No bony destruction is seen. IMPRESSION: Masslike density in the right lung bridging the minor fissure with some associated peripheral inflammatory changes. Associated small effusion is present. Mediastinal and hilar adenopathy likely reactive in nature is noted. These changes likely represent aggressive pneumonia given the short history and lack of associated ancillary findings to suggest neoplasm. Appropriate therapy is recommended and a follow-up contrast enhanced CT in 3-4 weeks is recommended to assess for resolution. Aortic Atherosclerosis (ICD10-I70.0). Electronically Signed   By: Inez Catalina M.D.   On: 12/20/2018 12:10   Dg Chest Port 1 View  Result Date: 12/26/2018 CLINICAL DATA:  Pneumonia EXAM: PORTABLE CHEST 1 VIEW COMPARISON:  12/20/2018 FINDINGS: There are new small, right greater than left bilateral pleural effusions and associated atelectasis or consolidation. A masslike density remains about the peripheral right midlung. Mild cardiomegaly. IMPRESSION: There are new small, right greater than left bilateral pleural effusions and associated atelectasis or consolidation. A masslike density remains about the peripheral right midlung. Electronically Signed   By: Eddie Candle M.D.   On: 12/26/2018 09:26     Discharge Exam: Vitals:   12/27/18 0527 12/27/18 0837  BP: 109/67   Pulse: 83   Resp: 18   Temp: (!) 97.5 F (36.4 C)   SpO2: 93% 92%   Vitals:   12/26/18 1942 12/26/18 2113 12/27/18 0527 12/27/18 0837  BP:  94/62 109/67    Pulse:  82 83   Resp:  17 18   Temp:  97.8 F (36.6 C) (!) 97.5 F (36.4 C)   TempSrc:      SpO2: 90% 100% 93% 92%  Weight:      Height:        General: Pt is alert, awake, not in acute distress Cardiovascular: RRR, S1/S2 +, no rubs, no gallops Respiratory: CTA bilaterally, no wheezing, no rhonchi, 3 L nasal cannula oxygen Abdominal: Soft, NT, ND, bowel sounds + Extremities: no edema, no cyanosis    The results of significant diagnostics from this hospitalization (including imaging, microbiology, ancillary and laboratory) are listed below for reference.     Microbiology: Recent Results (from the past 240 hour(s))  SARS Coronavirus 2 [facility] order, Performed in [facility] hospital lab) Nasopharyngeal  Nasopharyngeal Swab     Status: None   Collection Time: 12/20/18 10:18 AM   Specimen: Nasopharyngeal Swab  Result Value Ref Range Status   SARS Coronavirus 2 NEGATIVE NEGATIVE Final    Comment: (NOTE) If result is NEGATIVE SARS-CoV-2 target nucleic acids are NOT DETECTED. The SARS-CoV-2 RNA is generally detectable in upper and lower  respiratory specimens during the acute phase of infection. The lowest  concentration of SARS-CoV-2 viral copies this assay can detect is 250  copies / mL. A negative result does not preclude SARS-CoV-2 infection  and should not be used as the sole basis for treatment or other  patient management decisions.  A negative result may occur with  improper specimen collection / handling, submission of specimen other  than nasopharyngeal swab, presence of viral mutation(s) within the  areas targeted by this assay, and inadequate number of viral copies  (<250 copies / mL). A negative result must be combined with clinical  observations, patient history, and epidemiological information. If result is POSITIVE SARS-CoV-2 target nucleic acids are DETECTED. The SARS-CoV-2 RNA is generally detectable in upper and lower  respiratory specimens dur ing the  acute phase of infection.  Positive  results are indicative of active infection with SARS-CoV-2.  Clinical  correlation with patient history and other diagnostic information is  necessary to determine patient infection status.  Positive results do  not rule out bacterial infection or co-infection with other viruses. If result is PRESUMPTIVE POSTIVE SARS-CoV-2 nucleic acids MAY BE PRESENT.   A presumptive positive result was obtained on the submitted specimen  and confirmed on repeat testing.  While 2019 novel coronavirus  (SARS-CoV-2) nucleic acids may be present in the submitted sample  additional confirmatory testing may be necessary for epidemiological  and / or clinical management purposes  to differentiate between  SARS-CoV-2 and other Sarbecovirus currently known to infect humans.  If clinically indicated additional testing with an alternate test  methodology [phone]) is advised. The SARS-CoV-2 RNA is generally  detectable in upper and lower respiratory sp ecimens during the acute  phase of infection. The expected result is Negative. Fact Sheet for Patients:  StrictlyIdeas.no Fact Sheet for Healthcare Providers: BankingDealers.co.za This test is not yet approved or cleared by the [place] FDA and has been authorized for detection and/or diagnosis of SARS-CoV-2 by FDA under an Emergency Use Authorization (EUA).  This EUA will remain in effect (meaning this test can be used) for the duration of the COVID-19 declaration under Section 564(b)(1) of the Act, 21 U.S.C. section 360bbb-3(b)(1), unless the authorization is terminated or revoked sooner. Performed at [facility], [address], [place], [place] [address]   Blood culture (routine x 2)     Status: None   Collection Time: 12/20/18  2:08 PM   Specimen: BLOOD RIGHT HAND  Result Value Ref Range Status   Specimen Description BLOOD RIGHT HAND  Final   Special Requests   Final     BOTTLES DRAWN AEROBIC AND ANAEROBIC Blood Culture adequate volume   Culture   Final    NO GROWTH 5 DAYS Performed at [facility], [address] [place], [place] [address]    Report Status 12/25/2018 FINAL  Final  Blood culture (routine x 2)     Status: None   Collection Time: 12/20/18  2:09 PM   Specimen: BLOOD RIGHT HAND  Result Value Ref Range Status   Specimen Description BLOOD RIGHT HAND  Final   Special Requests   Final  BOTTLES DRAWN AEROBIC AND ANAEROBIC Blood Culture adequate volume   Culture   Final    NO GROWTH 5 DAYS Performed at [facility], [address] [place], [place] [address]    Report Status 12/25/2018 FINAL  Final  MRSA PCR Screening     Status: None   Collection Time: 12/21/18  8:10 AM   Specimen: Nasal Mucosa; Nasopharyngeal  Result Value Ref Range Status   MRSA by PCR NEGATIVE NEGATIVE Final    Comment:        The GeneXpert MRSA Assay (FDA approved for NASAL specimens only), is one component of a comprehensive MRSA colonization surveillance program. It is not intended to diagnose MRSA infection nor to guide or monitor treatment for MRSA infections. Performed at [facility], [address], [place], [place] [address]   Sputum culture     Status: None   Collection Time: 12/22/18 12:36 AM   Specimen: Expectorated Sputum  Result Value Ref Range Status   Specimen Description EXPECTORATED SPUTUM  Final   Special Requests NONE  Final   Sputum evaluation   Final    THIS SPECIMEN IS ACCEPTABLE FOR SPUTUM CULTURE Performed at [facility], [address] [place], [place] [address]    Report Status 12/22/2018 FINAL  Final  Culture, respiratory     Status: None   Collection Time: 12/22/18 12:36 AM  Result Value Ref Range Status   Specimen Description   Final    EXPECTORATED SPUTUM Performed at [facility], [address] [place], [place] [address]    Special Requests   Final    NONE Reflexed from [phone] Performed at [facility], [address], [place], [place] [address]    Gram Stain NO WBC SEEN NO ORGANISMS SEEN   Final   Culture   Final    FEW Consistent with normal respiratory flora. Performed at [facility], [place] [address], [place], [place] [address]    Report Status 12/24/2018 FINAL  Final     Labs: BNP (last 3 results) No results for input(s): BNP in the last 8760 hours. Basic Metabolic Panel: Recent Labs  Lab 12/22/18 0614 12/23/18 0633 12/24/18 0623 12/26/18 0417 12/27/18 0425  NA 138 144 144 137 140  K 4.4 4.2 3.9 2.6* 4.2  CL 107 111 110 99 107  CO2 22 24 26 29 25   GLUCOSE 174* 193* 152* 114* 112*  BUN 8 10 12 8 8   CREATININE 0.50 0.59 0.52 0.42* 0.51  CALCIUM 8.5* 8.6* 8.5* 7.8* 8.2*  MG 2.0  --   --  1.8 2.0   Liver Function Tests: No results for input(s): AST, ALT, ALKPHOS, BILITOT, PROT, ALBUMIN in the last 168 hours. No results for input(s): LIPASE, AMYLASE in the last 168 hours. No results for input(s): AMMONIA in the last 168 hours. CBC: Recent Labs  Lab 12/22/18 0614 12/23/18 0633 12/24/18 0623 12/25/18 0603 12/26/18 0417 12/27/18 0425  WBC 37.1* 38.5* 24.2* 25.8* 24.7* 25.3*  NEUTROABS 33.8*  --   --   --   --   --   HGB 10.1* 10.0* 9.3* 10.2* 10.6* 10.4*  HCT 32.0* 31.7* 29.5* 33.1* 33.0* 32.3*  MCV 96.1 96.4 94.9 96.2 93.2 93.4  PLT 699* 745* 679* 791* 697* 657*   Cardiac Enzymes: No results for input(s): CKTOTAL, CKMB, CKMBINDEX, TROPONINI in the last 168 hours. BNP: Invalid input(s): POCBNP CBG: Recent Labs  Lab 12/26/18 0155  GLUCAP 95   D-Dimer No results for input(s): DDIMER  in the last 72 hours. Hgb A1c No results for input(s): HGBA1C in the last 72 hours. Lipid Profile No results for input(s): CHOL, HDL, LDLCALC, TRIG, CHOLHDL, LDLDIRECT in the last 72 hours. Thyroid function studies No results for input(s): TSH, T4TOTAL, T3FREE, THYROIDAB in the last 72 hours.  Invalid input(s): FREET3 Anemia work up No results for input(s): VITAMINB12,  FOLATE, FERRITIN, TIBC, IRON, RETICCTPCT in the last 72 hours. Urinalysis    Component Value Date/Time   COLORURINE YELLOW 03/07/2010 2049   APPEARANCEUR CLEAR 03/07/2010 2049   LABSPEC >1.030 (H) 03/07/2010 2049   PHURINE 5.5 03/07/2010 2049   GLUCOSEU NEGATIVE 03/07/2010 2049   HGBUR TRACE (A) 03/07/2010 2049   BILIRUBINUR SMALL (A) 03/07/2010 2049   KETONESUR 15 (A) 03/07/2010 2049   PROTEINUR NEGATIVE 03/07/2010 2049   UROBILINOGEN 0.2 03/07/2010 2049   NITRITE NEGATIVE 03/07/2010 2049   LEUKOCYTESUR NEGATIVE 03/07/2010 2049   Sepsis Labs Invalid input(s): PROCALCITONIN,  WBC,  LACTICIDVEN Microbiology Recent Results (from the past 240 hour(s))  SARS Coronavirus 2 [facility] order, Performed in [facility] hospital lab) Nasopharyngeal Nasopharyngeal Swab     Status: None   Collection Time: 12/20/18 10:18 AM   Specimen: Nasopharyngeal Swab  Result Value Ref Range Status   SARS Coronavirus 2 NEGATIVE NEGATIVE Final    Comment: (NOTE) If result is NEGATIVE SARS-CoV-2 target nucleic acids are NOT DETECTED. The SARS-CoV-2 RNA is generally detectable in upper and lower  respiratory specimens during the acute phase of infection. The lowest  concentration of SARS-CoV-2 viral copies this assay can detect is 250  copies / mL. A negative result does not preclude SARS-CoV-2 infection  and should not be used as the sole basis for treatment or other  patient management decisions.  A negative result may occur with  improper specimen collection / handling, submission of specimen other  than nasopharyngeal swab, presence of viral mutation(s) within the  areas targeted by this assay, and inadequate number of viral copies  (<250 copies / mL). A negative result must be combined with clinical  observations, patient history, and epidemiological information. If result is POSITIVE SARS-CoV-2 target nucleic acids are DETECTED. The SARS-CoV-2 RNA is generally detectable in upper and lower   respiratory specimens dur ing the acute phase of infection.  Positive  results are indicative of active infection with SARS-CoV-2.  Clinical  correlation with patient history and other diagnostic information is  necessary to determine patient infection status.  Positive results do  not rule out bacterial infection or co-infection with other viruses. If result is PRESUMPTIVE POSTIVE SARS-CoV-2 nucleic acids MAY BE PRESENT.   A presumptive positive result was obtained on the submitted specimen  and confirmed on repeat testing.  While 2019 novel coronavirus  (SARS-CoV-2) nucleic acids may be present in the submitted sample  additional confirmatory testing may be necessary for epidemiological  and / or clinical management purposes  to differentiate between  SARS-CoV-2 and other Sarbecovirus currently known to infect humans.  If clinically indicated additional testing with an alternate test  methodology [phone]) is advised. The SARS-CoV-2 RNA is generally  detectable in upper and lower respiratory sp ecimens during the acute  phase of infection. The expected result is Negative. Fact Sheet for Patients:  StrictlyIdeas.no Fact Sheet for Healthcare Providers: BankingDealers.co.za This test is not yet approved or cleared by the [place] FDA and has been authorized for detection and/or diagnosis of SARS-CoV-2 by FDA under an Emergency Use Authorization (EUA).  This EUA will remain  in effect (meaning this test can be used) for the duration of the COVID-19 declaration under Section 564(b)(1) of the Act, 21 U.S.C. section 360bbb-3(b)(1), unless the authorization is terminated or revoked sooner. Performed at [facility], [address], [place], [place] [address]   Blood culture (routine x 2)     Status: None   Collection Time: 12/20/18  2:08 PM   Specimen: BLOOD RIGHT HAND  Result Value Ref Range Status   Specimen Description BLOOD RIGHT  HAND  Final   Special Requests   Final    BOTTLES DRAWN AEROBIC AND ANAEROBIC Blood Culture adequate volume   Culture   Final    NO GROWTH 5 DAYS Performed at [facility], [address], [place], [place] [address]    Report Status 12/25/2018 FINAL  Final  Blood culture (routine x 2)     Status: None   Collection Time: 12/20/18  2:09 PM   Specimen: BLOOD RIGHT HAND  Result Value Ref Range Status   Specimen Description BLOOD RIGHT HAND  Final   Special Requests   Final    BOTTLES DRAWN AEROBIC AND ANAEROBIC Blood Culture adequate volume   Culture   Final    NO GROWTH 5 DAYS Performed at [facility], [address], [place], [place] [address]    Report Status 12/25/2018 FINAL  Final  MRSA PCR Screening     Status: None   Collection Time: 12/21/18  8:10 AM   Specimen: Nasal Mucosa; Nasopharyngeal  Result Value Ref Range Status   MRSA by PCR NEGATIVE NEGATIVE Final    Comment:        The GeneXpert MRSA Assay (FDA approved for NASAL specimens only), is one component of a comprehensive MRSA colonization surveillance program. It is not intended to diagnose MRSA infection nor to guide or monitor treatment for MRSA infections. Performed at [facility], [address] [place], [place] [address]   Sputum culture     Status: None   Collection Time: 12/22/18 12:36 AM   Specimen: Expectorated Sputum  Result Value Ref Range Status   Specimen Description EXPECTORATED SPUTUM  Final   Special Requests NONE  Final   Sputum evaluation   Final    THIS SPECIMEN IS ACCEPTABLE FOR SPUTUM CULTURE Performed at [facility], [address] [place], [place] [address]    Report Status 12/22/2018 FINAL  Final  Culture, respiratory     Status: None   Collection Time: 12/22/18 12:36 AM  Result Value Ref Range Status   Specimen Description   Final    EXPECTORATED SPUTUM Performed at [facility], [address], [place], [place] [address]    Special Requests   Final    NONE Reflexed from  [id number] Performed at [facility], [address] [place], [place] [address]    Gram Stain NO WBC SEEN NO ORGANISMS SEEN   Final   Culture   Final    FEW Consistent with normal respiratory flora. Performed at [facility], [place] [address] [place],  [address]    Report Status 12/24/2018 FINAL  Final     Time coordinating discharge: 35 minutes  SIGNED:   Rodena Goldmann, DO [facility] 12/27/2018, 12:37 PM  If 7PM-7AM, please contact night-coverage [email or web address] Password [email or web address]

## 2018-12-27 NOTE — Telephone Encounter (Signed)
Rx sent. Pt notified by VM

## 2018-12-27 NOTE — Care Management (Signed)
[place]   [place],  [address] [phone]  [facility]

## 2018-12-27 NOTE — Progress Notes (Signed)
Subjective: She says she feels substantially better this morning.  She slept last night and she had not been sleeping previously.  She still has some chest pain.  She still has some cough which is now productive.  She says she is approaching her baseline  Objective: Vital signs in last 24 hours: Temp:  [97.5 F (36.4 C)-97.8 F (36.6 C)] 97.5 F (36.4 C) (09/15 0527) Pulse Rate:  [76-83] 83 (09/15 0527) Resp:  [17-18] 18 (09/15 0527) BP: (94-109)/(62-68) 109/67 (09/15 0527) SpO2:  [90 %-100 %] 93 % (09/15 0527) Weight change:  Last BM Date: 12/22/18  Intake/Output from previous day: 09/14 0701 - 09/15 0700 In: 120 [I.V.:20; IV Piggyback:100] Out: 3100 [Urine:3100]  PHYSICAL EXAM General appearance: alert, cooperative and no distress Resp: She still has rales and decreased breath sounds on the right Cardio: regular rate and rhythm, S1, S2 normal, no murmur, click, rub or gallop GI: soft, non-tender; bowel sounds normal; no masses,  no organomegaly Extremities: extremities normal, atraumatic, no cyanosis or edema  Lab Results:  Results for orders placed or performed during the hospital encounter of 12/20/18 (from the past 48 hour(s))  Glucose, capillary     Status: None   Collection Time: 12/26/18  1:55 AM  Result Value Ref Range   Glucose-Capillary 95 70 - 99 mg/dL  CBC     Status: Abnormal   Collection Time: 12/26/18  4:17 AM  Result Value Ref Range   WBC 24.7 (H) 4.0 - 10.5 K/uL   RBC 3.54 (L) 3.87 - 5.11 MIL/uL   Hemoglobin 10.6 (L) 12.0 - 15.0 g/dL   HCT 33.0 (L) 36.0 - 46.0 %   MCV 93.2 80.0 - 100.0 fL   MCH 29.9 26.0 - 34.0 pg   MCHC 32.1 30.0 - 36.0 g/dL   RDW 12.7 11.5 - 15.5 %   Platelets 697 (H) 150 - 400 K/uL   nRBC 0.1 0.0 - 0.2 %    Comment: Performed at [facility], [address], [place], [place] [id number]  Basic metabolic panel     Status: Abnormal   Collection Time: 12/26/18  4:17 AM  Result Value Ref Range   Sodium 137 135 - 145 mmol/L   Comment: DELTA CHECK NOTED   Potassium 2.6 (LL) 3.5 - 5.1 mmol/L    Comment: CRITICAL RESULT CALLED TO, READ BACK BY AND VERIFIED WITH: HOBBS,A AT 5:55AM ON 12/26/18 BY FESTERMAN,C    Chloride 99 98 - 111 mmol/L   CO2 29 22 - 32 mmol/L   Glucose, Bld 114 (H) 70 - 99 mg/dL   BUN 8 6 - 20 mg/dL   Creatinine, Ser 0.42 (L) 0.44 - 1.00 mg/dL   Calcium 7.8 (L) 8.9 - 10.3 mg/dL   GFR calc non Af Amer >60 >60 mL/min   GFR calc Af Amer >60 >60 mL/min   Anion gap 9 5 - 15    Comment: Performed at [facility], [address] [place], [place] [address]  Lactic acid, plasma     Status: None   Collection Time: 12/26/18  4:17 AM  Result Value Ref Range   Lactic Acid, Venous 0.8 0.5 - 1.9 mmol/L    Comment: Performed at [facility], [address] [place], [place] [address]  Magnesium     Status: None   Collection Time: 12/26/18  4:17 AM  Result Value Ref Range   Magnesium 1.8 1.7 - 2.4 mg/dL    Comment: Performed at [facility], [address],  [place], [place] [address]  CBC     Status: Abnormal   Collection Time: 12/27/18  4:25 AM  Result Value Ref Range   WBC 25.3 (H) 4.0 - 10.5 K/uL   RBC 3.46 (L) 3.87 - 5.11 MIL/uL   Hemoglobin 10.4 (L) 12.0 - 15.0 g/dL   HCT 32.3 (L) 36.0 - 46.0 %   MCV 93.4 80.0 - 100.0 fL   MCH 30.1 26.0 - 34.0 pg   MCHC 32.2 30.0 - 36.0 g/dL   RDW 12.9 11.5 - 15.5 %   Platelets 657 (H) 150 - 400 K/uL   nRBC 0.0 0.0 - 0.2 %    Comment: Performed at [facility], [address] [place], [place] [id number]  Basic metabolic panel     Status: Abnormal   Collection Time: 12/27/18  4:25 AM  Result Value Ref Range   Sodium 140 135 - 145 mmol/L   Potassium 4.2 3.5 - 5.1 mmol/L    Comment: DELTA CHECK NOTED   Chloride 107 98 - 111 mmol/L   CO2 25 22 - 32 mmol/L   Glucose, Bld 112 (H) 70 - 99 mg/dL   BUN 8 6 - 20 mg/dL   Creatinine, Ser 0.51 0.44 - 1.00 mg/dL   Calcium 8.2 (L) 8.9 - 10.3 mg/dL   GFR calc non Af Amer >60 >60 mL/min   GFR calc Af Amer >60 >60 mL/min    Anion gap 8 5 - 15    Comment: Performed at [facility], [address], [place], [place] [address]  Magnesium     Status: None   Collection Time: 12/27/18  4:25 AM  Result Value Ref Range   Magnesium 2.0 1.7 - 2.4 mg/dL    Comment: Performed at [facility], [address] [place], [place] [address]  Lactic acid, plasma     Status: None   Collection Time: 12/27/18  4:25 AM  Result Value Ref Range   Lactic Acid, Venous 1.2 0.5 - 1.9 mmol/L    Comment: Performed at [facility], [address], [place], [place] [address]    ABGS No results for input(s): PHART, PO2ART, TCO2, HCO3 in the last 72 hours.  Invalid input(s): PCO2 CULTURES Recent Results (from the past 240 hour(s))  SARS Coronavirus 2 [facility] order, Performed in [facility] hospital lab) Nasopharyngeal Nasopharyngeal Swab     Status: None   Collection Time: 12/20/18 10:18 AM   Specimen: Nasopharyngeal Swab  Result Value Ref Range Status   SARS Coronavirus 2 NEGATIVE NEGATIVE Final    Comment: (NOTE) If result is NEGATIVE SARS-CoV-2 target nucleic acids are NOT DETECTED. The SARS-CoV-2 RNA is generally detectable in upper and lower  respiratory specimens during the acute phase of infection. The lowest  concentration of SARS-CoV-2 viral copies this assay can detect is 250  copies / mL. A negative result does not preclude SARS-CoV-2 infection  and should not be used as the sole basis for treatment or other  patient management decisions.  A negative result may occur with  improper specimen collection / handling, submission of specimen other  than nasopharyngeal swab, presence of viral mutation(s) within the  areas targeted by this assay, and inadequate number of viral copies  (<250 copies / mL). A negative result must be combined with clinical  observations, patient history, and epidemiological information. If result is POSITIVE SARS-CoV-2 target nucleic acids are DETECTED. The SARS-CoV-2 RNA is generally detectable  in upper and lower  respiratory specimens dur ing the acute phase  of infection.  Positive  results are indicative of active infection with SARS-CoV-2.  Clinical  correlation with patient history and other diagnostic information is  necessary to determine patient infection status.  Positive results do  not rule out bacterial infection or co-infection with other viruses. If result is PRESUMPTIVE POSTIVE SARS-CoV-2 nucleic acids MAY BE PRESENT.   A presumptive positive result was obtained on the submitted specimen  and confirmed on repeat testing.  While 2019 novel coronavirus  (SARS-CoV-2) nucleic acids may be present in the submitted sample  additional confirmatory testing may be necessary for epidemiological  and / or clinical management purposes  to differentiate between  SARS-CoV-2 and other Sarbecovirus currently known to infect humans.  If clinically indicated additional testing with an alternate test  methodology [phone]) is advised. The SARS-CoV-2 RNA is generally  detectable in upper and lower respiratory sp ecimens during the acute  phase of infection. The expected result is Negative. Fact Sheet for Patients:  StrictlyIdeas.no Fact Sheet for Healthcare Providers: BankingDealers.co.za This test is not yet approved or cleared by the [place] FDA and has been authorized for detection and/or diagnosis of SARS-CoV-2 by FDA under an Emergency Use Authorization (EUA).  This EUA will remain in effect (meaning this test can be used) for the duration of the COVID-19 declaration under Section 564(b)(1) of the Act, 21 U.S.C. section 360bbb-3(b)(1), unless the authorization is terminated or revoked sooner. Performed at [facility], [address] [place], [place] [address]   Blood culture (routine x 2)     Status: None   Collection Time: 12/20/18  2:08 PM   Specimen: BLOOD RIGHT HAND  Result Value Ref Range Status   Specimen  Description BLOOD RIGHT HAND  Final   Special Requests   Final    BOTTLES DRAWN AEROBIC AND ANAEROBIC Blood Culture adequate volume   Culture   Final    NO GROWTH 5 DAYS Performed at [facility], [address] [place], [place] [address]    Report Status 12/25/2018 FINAL  Final  Blood culture (routine x 2)     Status: None   Collection Time: 12/20/18  2:09 PM   Specimen: BLOOD RIGHT HAND  Result Value Ref Range Status   Specimen Description BLOOD RIGHT HAND  Final   Special Requests   Final    BOTTLES DRAWN AEROBIC AND ANAEROBIC Blood Culture adequate volume   Culture   Final    NO GROWTH 5 DAYS Performed at [facility], [address], [place], [place] [address]    Report Status 12/25/2018 FINAL  Final  MRSA PCR Screening     Status: None   Collection Time: 12/21/18  8:10 AM   Specimen: Nasal Mucosa; Nasopharyngeal  Result Value Ref Range Status   MRSA by PCR NEGATIVE NEGATIVE Final    Comment:        The GeneXpert MRSA Assay (FDA approved for NASAL specimens only), is one component of a comprehensive MRSA colonization surveillance program. It is not intended to diagnose MRSA infection nor to guide or monitor treatment for MRSA infections. Performed at [facility], [address], [place], [place] [address]   Sputum culture     Status: None   Collection Time: 12/22/18 12:36 AM   Specimen: Expectorated Sputum  Result Value Ref Range Status   Specimen Description EXPECTORATED SPUTUM  Final   Special Requests NONE  Final   Sputum evaluation   Final    THIS SPECIMEN IS ACCEPTABLE FOR SPUTUM CULTURE  Performed at [facility], [address] [place], [place] [address]    Report Status 12/22/2018 FINAL  Final  Culture, respiratory     Status: None   Collection Time: 12/22/18 12:36 AM  Result Value Ref Range Status   Specimen Description   Final    EXPECTORATED SPUTUM Performed at [facility], [address] [place], [place] [address]    Special Requests   Final     NONE Reflexed from [id number] Performed at [facility], [address], [place], [place] [address]    Gram Stain NO WBC SEEN NO ORGANISMS SEEN   Final   Culture   Final    FEW Consistent with normal respiratory flora. Performed at [facility], [place] [address] [place],  [address]    Report Status 12/24/2018 FINAL  Final   Studies/Results: Dg Chest Port 1 View  Result Date: 12/26/2018 CLINICAL DATA:  Pneumonia EXAM: PORTABLE CHEST 1 VIEW COMPARISON:  12/20/2018 FINDINGS: There are new small, right greater than left bilateral pleural effusions and associated atelectasis or consolidation. A masslike density remains about the peripheral right midlung. Mild cardiomegaly. IMPRESSION: There are new small, right greater than left bilateral pleural effusions and associated atelectasis or consolidation. A masslike density remains about the peripheral right midlung. Electronically Signed   By: Eddie Candle M.D.   On: 12/26/2018 09:26    Medications:  Prior to Admission:  Medications Prior to Admission  Medication Sig Dispense Refill Last Dose  . aspirin EC 81 MG tablet Take 81 mg by mouth daily.   12/19/2018 at 900  . cabergoline (DOSTINEX) 0.5 MG tablet Take 0.5 tablets (0.25 mg total) by mouth 2 (two) times a week. 10 tablet 2 12/19/2018 at Unknown time  . clopidogrel (PLAVIX) 75 MG tablet Take 1 tablet (75 mg total) by mouth daily. 30 tablet 0 12/19/2018 at 900  . estradiol (ESTRACE) 2 MG tablet TAKE 1 TABLET BY MOUTH ONCE DAILY. 30 tablet 11 12/19/2018 at Unknown time  . Icosapent Ethyl (VASCEPA) 1 G CAPS Take 2 g by mouth 2 (two) times daily.    12/19/2018 at Unknown time  . lansoprazole (PREVACID) 30 MG capsule TAKE 1 CAPSULE IN THE MORNING (1 HOUR BEFORE BREAKFAST) 30 capsule 5 12/19/2018 at Unknown time  . levothyroxine (SYNTHROID) 50 MCG tablet Take 1 tablet (50 mcg total) by mouth daily. 90 tablet 1 12/19/2018 at Unknown time  . Multiple Vitamin (MULTIVITAMIN) tablet Take 1 tablet by mouth  daily.   12/19/2018 at Unknown time  . rosuvastatin (CRESTOR) 20 MG tablet Take 20 mg by mouth daily.   12/19/2018 at Unknown time  . naproxen sodium (ALEVE) 220 MG tablet Take 220 mg by mouth daily as needed (pain).     . ranitidine (ZANTAC) 300 MG tablet TAKE ONE TABLET BY MOUTH AT BEDTIME. (Patient not taking: Reported on 12/20/2018) 30 tablet 5 Not Taking at Unknown time   Scheduled: . aspirin EC  81 mg Oral Daily  . cabergoline  0.25 mg Oral 2 times weekly  . clopidogrel  75 mg Oral Daily  . enoxaparin (LOVENOX) injection  40 mg Subcutaneous Q24H  . estradiol  2 mg Oral QHS  . famotidine  20 mg Oral BID  . feeding supplement (ENSURE ENLIVE)  237 mL Oral BID BM  . guaiFENesin  1,200 mg Oral BID  . levothyroxine  50 mcg Oral Daily  . lidocaine  1 patch Transdermal Q24H  . metroNIDAZOLE  500 mg Oral Q8H  .  multivitamin with minerals  1 tablet Oral Daily  . predniSONE  10 mg Oral Q breakfast   Continuous: . sodium chloride 250 mL (12/24/18 1459)  . ceFEPime (MAXIPIME) IV 2 g (12/27/18 0529)   [id number] chloride, acetaminophen, ALPRAZolam, alum & mag hydroxide-simeth, HYDROmorphone (DILAUDID) injection, ipratropium-albuterol, ondansetron (ZOFRAN) IV, simethicone, sodium chloride  Assesment: She was admitted with community-acquired pneumonia with associated acute hypoxic respiratory failure.  She had a cavity on her x-ray and her chest x-ray done yesterday to be sure she did not have a pneumothorax still shows a dense infiltrate and a little bit more fluid.  She feels a little bit better today.  She is still requiring oxygen.  She has COPD at baseline and that is being treated.  She has chronic elevation of her white blood cell count her white blood cell count is still greater than 20,000.  She was hypokalemic and that is been replaced  She has anxiety at baseline and she is on treatment for that Principal Problem:   RLL pneumonia ([place]) Active Problems:   Carotid artery disease ([place])    Hyperlipidemia   GERD (gastroesophageal reflux disease)   Stroke ([place])   Tobacco abuse   Acute hypoxemic respiratory failure ([place])    Plan: Continue current treatments.  She had a bad day yesterday but is substantially better today    LOS: 6 days   [name] 12/27/2018, 8:01 AM

## 2018-12-27 NOTE — Telephone Encounter (Signed)
Pt said she was just released from [facility]. She said the hospital was using her cabergoline (DOSTINEX) tablet 0.25 mg for her and somehow misplaced what she had left. She is due to take it again tomorrow. She is asking for a new RX

## 2018-12-29 ENCOUNTER — Other Ambulatory Visit (HOSPITAL_COMMUNITY): Payer: BLUE CROSS/BLUE SHIELD

## 2018-12-29 ENCOUNTER — Ambulatory Visit (HOSPITAL_COMMUNITY): Payer: BLUE CROSS/BLUE SHIELD

## 2018-12-30 ENCOUNTER — Ambulatory Visit (HOSPITAL_COMMUNITY): Payer: BLUE CROSS/BLUE SHIELD | Admitting: Hematology

## 2019-01-09 ENCOUNTER — Telehealth: Payer: Self-pay | Admitting: "Endocrinology

## 2019-01-09 DIAGNOSIS — E2740 Unspecified adrenocortical insufficiency: Secondary | ICD-10-CM

## 2019-01-09 NOTE — Telephone Encounter (Signed)
Pt said ever since she got out of the hospital about 2 weeks ago she is just so thirsty. She was unsure if she needs to talk to [place] or her PCP. She drinks about 3-4 bottles of water during the night and all day

## 2019-01-09 NOTE — Telephone Encounter (Signed)
I recommend a CMP and a1c. We will contact her if abnormal.

## 2019-01-09 NOTE — Telephone Encounter (Signed)
Pt notified to call back and let us know where to send lab order

## 2019-01-09 NOTE — Telephone Encounter (Signed)
Any reason she needs to speak to [place]?

## 2019-01-24 ENCOUNTER — Other Ambulatory Visit (HOSPITAL_COMMUNITY): Payer: Self-pay | Admitting: Pulmonary Disease

## 2019-01-24 ENCOUNTER — Other Ambulatory Visit: Payer: Self-pay | Admitting: Pulmonary Disease

## 2019-01-24 DIAGNOSIS — J189 Pneumonia, unspecified organism: Secondary | ICD-10-CM

## 2019-02-09 ENCOUNTER — Other Ambulatory Visit: Payer: Self-pay

## 2019-02-09 ENCOUNTER — Ambulatory Visit (HOSPITAL_COMMUNITY)
Admission: RE | Admit: 2019-02-09 | Discharge: 2019-02-09 | Disposition: A | Discharge status: Home / Self Care | Payer: BC Managed Care – PPO | Source: Ambulatory Visit | Attending: Pulmonary Disease | Admitting: Pulmonary Disease

## 2019-02-09 DIAGNOSIS — J189 Pneumonia, unspecified organism: Secondary | ICD-10-CM | POA: Insufficient documentation

## 2019-02-09 MED ORDER — IOHEXOL 300 MG/ML  SOLN
75.0000 mL | Freq: Once | INTRAMUSCULAR | Status: AC | PRN
  Administered 2019-02-09: 75 mL via INTRAVENOUS

## 2019-04-19 ENCOUNTER — Ambulatory Visit: Payer: BC Managed Care – PPO | Admitting: "Endocrinology

## 2019-04-25 LAB — COMPLETE METABOLIC PANEL WITH GFR
AG Ratio: 1.3 (calc) (ref 1.0–2.5)
ALT: 13 U/L (ref 6–29)
AST: 19 U/L (ref 10–35)
Albumin: 4.4 g/dL (ref 3.6–5.1)
Alkaline phosphatase (APISO): 97 U/L (ref 31–125)
BUN: 7 mg/dL (ref 7–25)
CO2: 27 mmol/L (ref 20–32)
Calcium: 9.7 mg/dL (ref 8.6–10.2)
Chloride: 104 mmol/L (ref 98–110)
Creat: 0.69 mg/dL (ref 0.50–1.10)
GFR, Est African American: 119 mL/min/{1.73_m2} (ref >=60)
GFR, Est Non African American: 103 mL/min/{1.73_m2} (ref >=60)
Globulin: 3.3 g/dL (calc) (ref 1.9–3.7)
Glucose, Bld: 142 mg/dL — ABNORMAL HIGH (ref 65–99)
Potassium: 5 mmol/L (ref 3.5–5.3)
Sodium: 140 mmol/L (ref 135–146)
Total Bilirubin: 0.3 mg/dL (ref 0.2–1.2)
Total Protein: 7.7 g/dL (ref 6.1–8.1)

## 2019-04-25 LAB — TSH: TSH: 0.41 mIU/L

## 2019-04-25 LAB — PROLACTIN: Prolactin: <1 ng/mL — ABNORMAL LOW

## 2019-04-25 LAB — VITAMIN D 25 HYDROXY (VIT D DEFICIENCY, FRACTURES): Vit D, 25-Hydroxy: 25 ng/mL — ABNORMAL LOW (ref 30–100)

## 2019-04-25 LAB — T4, FREE: Free T4: 1.2 ng/dL (ref 0.8–1.8)

## 2019-04-28 ENCOUNTER — Ambulatory Visit (INDEPENDENT_AMBULATORY_CARE_PROVIDER_SITE_OTHER): Payer: BC Managed Care – PPO | Admitting: "Endocrinology

## 2019-04-28 ENCOUNTER — Encounter: Payer: Self-pay | Admitting: "Endocrinology

## 2019-04-28 DIAGNOSIS — E221 Hyperprolactinemia: Secondary | ICD-10-CM

## 2019-04-28 DIAGNOSIS — E038 Other specified hypothyroidism: Secondary | ICD-10-CM

## 2019-04-28 DIAGNOSIS — E2740 Unspecified adrenocortical insufficiency: Secondary | ICD-10-CM

## 2019-04-28 DIAGNOSIS — D352 Benign neoplasm of pituitary gland: Secondary | ICD-10-CM

## 2019-04-28 MED ORDER — LEVOTHYROXINE SODIUM 50 MCG PO TABS: 50.0000 ug | ORAL_TABLET | Freq: Every day | ORAL | 1 refills | Status: DC

## 2019-04-28 MED ORDER — CABERGOLINE 0.5 MG PO TABS: 0.2500 mg | ORAL_TABLET | ORAL | 2 refills | Status: DC

## 2019-04-28 NOTE — Progress Notes (Signed)
04/28/2019                                Endocrinology Telehealth Visit Follow up Note -During COVID -19 Pandemic  I connected with [name] on 04/28/2019   by telephone and verified that I am speaking with the correct person using two identifiers. [name], 1970/12/08. she has verbally consented to this visit. All issues noted in this document were discussed and addressed. The format was not optimal for physical exam.   Subjective:    Patient ID: [name], female    DOB: March 22, 1971, PCP Sander Radon, NP   Past Medical History:  Diagnosis Date  . Bleeding ulcer   . Bronchitis    hx of  . 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 Cervical cancer ([facility])    cervical  . Chronic headaches   . Common migraine with intractable migraine 02/22/2017  . GERD (gastroesophageal reflux disease)   . Hyperlipidemia   . Hypothyroidism   . Pituitary tumor   . PUD (peptic ulcer disease)    NSAIDS   . Stenosis of right carotid artery   . Stroke ([place])    mini-stroke; memory deficits, slight  . TIA (transient ischemic attack)    Past Surgical History:  Procedure Laterality Date  . ABDOMINAL HYSTERECTOMY    . BIOPSY  02/23/2017   Procedure: BIOPSY;  Surgeon: Danie Binder, MD;  [facility]: [facility];  Service: Endoscopy;;  duodenal bx's  . BLADDER SUSPENSION    . ESOPHAGOGASTRODUODENOSCOPY  02/2010   Dr. Barney Drain. Multiple duodenal ulcers, gastric erosions (bx negative for H.Pylori)  . ESOPHAGOGASTRODUODENOSCOPY N/A 06/19/2013   SLF: 1. Earlene Plater /dyspepsia due to uncontrolled GERD, & NSAID gastritis/duodenitis.  Marland Kitchen ESOPHAGOGASTRODUODENOSCOPY (EGD) WITH PROPOFOL N/A 02/23/2017   Procedure: ESOPHAGOGASTRODUODENOSCOPY (EGD) WITH PROPOFOL;  Surgeon: Danie Binder, MD;  Location: [facility];  Service: Endoscopy;  Laterality: N/A;  8:15am  . Multiple cyst removal     coccyx, left ring finger, pelvic area & throat/vocal  cord and pilonidal cyst  . RADIOLOGY WITH ANESTHESIA  03/23/2012   Procedure: RADIOLOGY WITH ANESTHESIA;  Surgeon: Rob Hickman, MD;  Location: [facility];  Service: Radiology;  Laterality: N/A;  . TOTAL VAGINAL HYSTERECTOMY     Social History   Socioeconomic History  . Marital status: Married    Spouse name: Sherren Mocha  . Number of children: 3  . Years of education: 32  . Highest education level: Not on file  Occupational History  . Occupation: unemployed    Fish farm manager: UNEMPLOYED  Tobacco Use  . Smoking status: Current Every Day Smoker    Packs/day: 1.00    Years: 25.00    Pack years: 25.00    Types: Cigarettes  . Smokeless tobacco: Never Used  Substance and Sexual Activity  . Alcohol use: Yes    Comment: once a week  . Drug use: No  . Sexual activity: Yes    Partners: Male    Birth control/protection: Surgical    Comment: spouse  Other Topics Concern  . Not on file  Social History Narrative   Lives with spouse and kids   Caffeine use:    Social Determinants of Health   Financial Resource Strain:   . Difficulty of Paying Living Expenses: Not on file  Food Insecurity:   . Worried About  Running Out of Food in the Last Year: Not on file  . Ran Out of Food in the Last Year: Not on file  Transportation Needs:   . Lack of Transportation (Medical): Not on file  . Lack of Transportation (Non-Medical): Not on file  Physical Activity:   . Days of Exercise per Week: Not on file  . Minutes of Exercise per Session: Not on file  Stress:   . Feeling of Stress : Not on file  Social Connections:   . Frequency of Communication with Friends and Family: Not on file  . Frequency of Social Gatherings with Friends and Family: Not on file  . Attends Religious Services: Not on file  . Active Member of Clubs or Organizations: Not on file  . Attends Archivist Meetings: Not on file  . Marital Status: Not on file   Outpatient Encounter Medications as of 04/28/2019  Medication  Sig  . Cholecalciferol (VITAMIN D) 50 MCG (2000 UT) CAPS Take 2,000 Units by mouth daily with breakfast.  . albuterol (VENTOLIN HFA) 108 (90 Base) MCG/ACT inhaler Inhale 2 puffs into the lungs every 6 (six) hours as needed for wheezing or shortness of breath.  Marland Kitchen aspirin EC 81 MG tablet Take 81 mg by mouth daily.  Derrill Memo ON 05/01/2019] cabergoline (DOSTINEX) 0.5 MG tablet Take 0.5 tablets (0.25 mg total) by mouth 2 (two) times a week.  . clopidogrel (PLAVIX) 75 MG tablet Take 1 tablet (75 mg total) by mouth daily.  Marland Kitchen estradiol (ESTRACE) 2 MG tablet TAKE 1 TABLET BY MOUTH ONCE DAILY.  Marland Kitchen Icosapent Ethyl (VASCEPA) 1 G CAPS Take 2 g by mouth 2 (two) times daily.   . lansoprazole (PREVACID) 30 MG capsule TAKE 1 CAPSULE IN THE MORNING (1 HOUR BEFORE BREAKFAST)  . levothyroxine (SYNTHROID) 50 MCG tablet Take 1 tablet (50 mcg total) by mouth daily.  Marland Kitchen lidocaine (LIDODERM) 5 % Place 1 patch onto the skin daily. Remove & Discard patch within 12 hours or as directed by MD  . Multiple Vitamin (MULTIVITAMIN) tablet Take 1 tablet by mouth daily.  . naproxen sodium (ALEVE) 220 MG tablet Take 220 mg by mouth daily as needed (pain).  . ranitidine (ZANTAC) 300 MG tablet TAKE ONE TABLET BY MOUTH AT BEDTIME. (Patient not taking: Reported on 12/20/2018)  . rosuvastatin (CRESTOR) 20 MG tablet Take 20 mg by mouth daily.  . [DISCONTINUED] cabergoline (DOSTINEX) 0.5 MG tablet Take 0.5 tablets (0.25 mg total) by mouth 2 (two) times a week.  . [DISCONTINUED] levothyroxine (SYNTHROID) 50 MCG tablet Take 1 tablet (50 mcg total) by mouth daily.   No facility-administered encounter medications on file as of 04/28/2019.   ALLERGIES: Allergies  Allergen Reactions  . Pantoprazole Sodium Itching and Rash  . Penicillins Itching and Rash    Has patient had a PCN reaction causing immediate rash, facial/tongue/throat swelling, SOB or lightheadedness with hypotension: Yes Has patient had a PCN reaction causing severe rash involving  mucus membranes or skin necrosis: No Has patient had a PCN reaction that required hospitalization: No Has patient had a PCN reaction occurring within the last 10 years: No If all of the above answers are "NO", then may proceed with Cephalosporin use.   . Sulfa Antibiotics Itching and Rash  . Codeine Nausea And Vomiting  . Demerol [Meperidine]     Red streaks on arm after coadministration of phenergan and demerol at time of EGD in 2012. Tolerated phenergan with subsequent EGD.    VACCINATION STATUS:  There is no immunization history on file for this patient.  HPI 49 year old female with medical history as above.   She is being engaged in telehealth via telephone for follow-up of hypothyroidism, hyperprolactinemia/pituitary microadenoma, and history of adrenal insufficiency. -She was taken off of cabergoline during her last visit due to low normal prolactin level for more than 2 years.  Her previsit labs show treatment effect with lowering of PRL to <1.0 from  37.6 and she feels better. The issues of  her breasts getting fuller and slightly tender are improved.   She is compliant with her levothyroxine 50 mcg p.o. every morning for hypothyroidism.       She denies nausea, vomiting.  Denies palpitations, tremors, hot flashes.   -She remains off of steroids, she denies any recent hospitalization for adrenal crisis. - She has history of leukocytosis/ thrombocytosis, bilateral carotid artery stenosis complicated by cerebrovascular accident.   Review of Systems  Limited as above. Objective:    There were no vitals taken for this visit.  Wt Readings from Last 3 Encounters:  12/20/18 101 lb 13.6 oz (46.2 kg)  06/24/18 107 lb 14.4 oz (48.9 kg)  06/13/18 106 lb 12.8 oz (48.4 kg)    Physical Exam CMP     Component Value Date/Time   NA 140 04/24/2019 1031   K 5.0 04/24/2019 1031   CL 104 04/24/2019 1031   CO2 27 04/24/2019 1031   GLUCOSE 142 (H) 04/24/2019 1031   BUN 7 04/24/2019 1031    BUN 8 05/18/2017 0000   CREATININE 0.69 04/24/2019 1031   CALCIUM 9.7 04/24/2019 1031   PROT 7.7 04/24/2019 1031   ALBUMIN 3.0 (L) 12/20/2018 1117   AST 19 04/24/2019 1031   ALT 13 04/24/2019 1031   ALKPHOS 92 12/20/2018 1117   BILITOT 0.3 04/24/2019 1031   GFRNONAA 103 04/24/2019 1031   GFRAA 119 04/24/2019 1031    Recent Results (from the past 2160 hour(s))  Prolactin     Status: Abnormal   Collection Time: 04/24/19 10:31 AM  Result Value Ref Range   Prolactin <1.0 (L) ng/mL    Comment:             Reference Range  Females         Non-pregnant        3.0-30.0         Pregnant           10.0-209.0         Postmenopausal      2.0-20.0 . . .   T4, free     Status: None   Collection Time: 04/24/19 10:31 AM  Result Value Ref Range   Free T4 1.2 0.8 - 1.8 ng/dL  TSH     Status: None   Collection Time: 04/24/19 10:31 AM  Result Value Ref Range   TSH 0.41 mIU/L    Comment:           Reference Range .           > or = 20 Years  0.40-4.50 .                Pregnancy Ranges           First trimester    0.26-2.66           Second trimester   0.55-2.73           Third trimester    0.43-2.91   COMPLETE METABOLIC PANEL WITH GFR  Status: Abnormal   Collection Time: 04/24/19 10:31 AM  Result Value Ref Range   Glucose, Bld 142 (H) 65 - 99 mg/dL    Comment: .            Fasting reference interval . For someone without known diabetes, a glucose value >125 mg/dL indicates that they may have diabetes and this should be confirmed with a follow-up test. .    BUN 7 7 - 25 mg/dL   Creat 0.69 0.50 - 1.10 mg/dL   GFR, Est Non African American 103 > OR = 60 mL/min/1.62m2   GFR, Est African American 119 > OR = 60 mL/min/1.71m2   BUN/Creatinine Ratio NOT APPLICABLE 6 - 22 (calc)   Sodium 140 135 - 146 mmol/L   Potassium 5.0 3.5 - 5.3 mmol/L   Chloride 104 98 - 110 mmol/L   CO2 27 20 - 32 mmol/L   Calcium 9.7 8.6 - 10.2 mg/dL   Total Protein 7.7 6.1 - 8.1 g/dL   Albumin 4.4  3.6 - 5.1 g/dL   Globulin 3.3 1.9 - 3.7 g/dL (calc)   AG Ratio 1.3 1.0 - 2.5 (calc)   Total Bilirubin 0.3 0.2 - 1.2 mg/dL   Alkaline phosphatase (APISO) 97 31 - 125 U/L   AST 19 10 - 35 U/L   ALT 13 6 - 29 U/L  VITAMIN D 25 Hydroxy (Vit-D Deficiency, Fractures)     Status: Abnormal   Collection Time: 04/24/19 10:31 AM  Result Value Ref Range   Vit D, 25-Hydroxy 25 (L) 30 - 100 ng/mL    Comment: Vitamin D Status         25-OH Vitamin D: . Deficiency:                    <20 ng/mL Insufficiency:             20 - 29 ng/mL Optimal:                 > or = 30 ng/mL . For 25-OH Vitamin D testing on patients on  D2-supplementation and patients for whom quantitation  of D2 and D3 fractions is required, the [facility](TM) 25-OH VIT D, (D2,D3), LC/MS/MS is recommended: order  code [id number] (patients >55yrs). See Note 1 . Note 1 . For additional information, please refer to  http://education.[facility].com/faq/FAQ199  (This link is being provided for informational/ educational purposes only.)       Labs from 02/05/2016 showed TSH 1.68, free T4 0.49   Labs from 12/21/2016: TSH 1.7, free T4 0.79 (normal 0.8-1 277), sodium 140, potassium 5.4, leukocytosis at 13, normal lipid panel.  Labs from December 06, 2018: A1c 5.5%, TSH 0.83, prolactin increased 37.6  Assessment & Plan:   1. Hypothyroidism 2.  History of adrenal insufficiency ([place]) 3. Hyperprolactinemia ([place]) 4. Pituitary microadenoma ([place])   - She has long and complicated medical history including what appears to be steroid induced adrenal insufficiency which required up to 7.5 mg of prednisone daily for at least 6 years, until she decided to stop prednisone in February 2018. She denied any interval hospitalizations or adrenal crisis.    -Her most recent a.m. cortisol is reassuring at 13.2.  She will not require adrenal intervention at this time.  she will need a.m. cortisol at least every year for reevaluation.  -  Regarding her history of hyperprolactinemia   -Her previsit labs show improvement in her PRL to <1.0 from  37.6 , she will be continued  on treatment with cabergoline.  She agrees with plan to continue cabergoline 0.25 mg twice weekly with plan to measure in 6 months.    Her hyperprolactinemia  is likely related to history microadenoma (7 mm pituitary adenoma based on MRI in 2012).  Her 2014 pituitary/sella MRI was significant for 7 mm pituitary microadenoma.  She will not require any surveillance MRI of sella/pituitary this time.  Regarding her hypothyroidism: -Her previsit thyroid function tests are consistent with appropriate replacement with levothyroxine currently at 50 mcg p.o. daily before breakfast.     - We discussed about the correct intake of her thyroid hormone, on empty stomach at fasting, with water, separated by at least 30 minutes from breakfast and other medications,  and separated by more than 4 hours from calcium, iron, multivitamins, acid reflux medications (PPIs). -Patient is made aware of the fact that thyroid hormone replacement is needed for life, dose to be adjusted by periodic monitoring of thyroid function tests.  - I advised patient to maintain close follow up with Sander Radon, NP for primary care needs.     - Time spent on this patient care encounter:  25 minutes of which 50% was spent in  counseling and the rest reviewing  her current and  previous labs / studies and medications  doses and developing a plan for long term care. [name]  participated in the discussions, expressed understanding, and voiced agreement with the above plans.  All questions were answered to her satisfaction. she is encouraged to contact clinic should she have any questions or concerns prior to her return visit.  Follow up plan: Return in about 6 months (around 10/26/2019) for Follow up with Pre-visit Labs.  Glade Lloyd, MD Phone: [phone]  Fax: [phone]  This note was  partially dictated with voice recognition software. Similar sounding words can be transcribed inadequately or may not  be corrected upon review.  04/28/2019, 11:51 AM

## 2019-08-17 ENCOUNTER — Other Ambulatory Visit: Payer: Self-pay | Admitting: "Endocrinology

## 2019-10-23 ENCOUNTER — Other Ambulatory Visit: Payer: Self-pay | Admitting: Obstetrics & Gynecology

## 2019-10-23 ENCOUNTER — Other Ambulatory Visit: Payer: Self-pay | Admitting: "Endocrinology

## 2019-10-28 LAB — COMPLETE METABOLIC PANEL WITH GFR
AG Ratio: 1.5 (calc) (ref 1.0–2.5)
ALT: 11 U/L (ref 6–29)
AST: 21 U/L (ref 10–35)
Albumin: 4.2 g/dL (ref 3.6–5.1)
Alkaline phosphatase (APISO): 81 U/L (ref 31–125)
BUN/Creatinine Ratio: 10 (calc) (ref 6–22)
BUN: 6 mg/dL — ABNORMAL LOW (ref 7–25)
CO2: 24 mmol/L (ref 20–32)
Calcium: 9 mg/dL (ref 8.6–10.2)
Chloride: 98 mmol/L (ref 98–110)
Creat: 0.63 mg/dL (ref 0.50–1.10)
GFR, Est African American: 122 mL/min/{1.73_m2} (ref >=60)
GFR, Est Non African American: 105 mL/min/{1.73_m2} (ref >=60)
Globulin: 2.8 g/dL (calc) (ref 1.9–3.7)
Glucose, Bld: 86 mg/dL (ref 65–99)
Potassium: 4.5 mmol/L (ref 3.5–5.3)
Sodium: 133 mmol/L — ABNORMAL LOW (ref 135–146)
Total Bilirubin: 0.4 mg/dL (ref 0.2–1.2)
Total Protein: 7 g/dL (ref 6.1–8.1)

## 2019-10-28 LAB — TSH: TSH: 0.35 mIU/L — ABNORMAL LOW

## 2019-10-28 LAB — T4, FREE: Free T4: 1.2 ng/dL (ref 0.8–1.8)

## 2019-10-28 LAB — PROLACTIN: Prolactin: <1 ng/mL — ABNORMAL LOW

## 2019-10-31 ENCOUNTER — Other Ambulatory Visit: Payer: Self-pay

## 2019-10-31 ENCOUNTER — Encounter: Payer: Self-pay | Admitting: "Endocrinology

## 2019-10-31 ENCOUNTER — Ambulatory Visit: Payer: BC Managed Care – PPO | Admitting: "Endocrinology

## 2019-10-31 VITALS — BP 101/70 | HR 82 | Ht 62.0 in | Wt 101.8 lb

## 2019-10-31 DIAGNOSIS — E559 Vitamin D deficiency, unspecified: Secondary | ICD-10-CM

## 2019-10-31 DIAGNOSIS — E221 Hyperprolactinemia: Secondary | ICD-10-CM | POA: Diagnosis not present

## 2019-10-31 DIAGNOSIS — D352 Benign neoplasm of pituitary gland: Secondary | ICD-10-CM

## 2019-10-31 DIAGNOSIS — E038 Other specified hypothyroidism: Secondary | ICD-10-CM | POA: Diagnosis not present

## 2019-10-31 MED ORDER — CABERGOLINE 0.5 MG PO TABS: 0.2500 mg | ORAL_TABLET | ORAL | 2 refills | Status: DC

## 2019-10-31 NOTE — Progress Notes (Signed)
10/31/2019    Endocrinology follow-up note   Subjective:    Patient ID: [name], female    DOB: 03/06/1971, PCP Sander Radon, NP   Past Medical History:  Diagnosis Date  . Bleeding ulcer   . Bronchitis    hx of  . Carotid artery disease ([place])    bilateral internal carotid artery occlusion by duplex ultrasound  . Cervical cancer ([facility])    cervical  . Chronic headaches   . Common migraine with intractable migraine 02/22/2017  . GERD (gastroesophageal reflux disease)   . Hyperlipidemia   . Hypothyroidism   . Pituitary tumor   . PUD (peptic ulcer disease)    NSAIDS   . Stenosis of right carotid artery   . Stroke ([place])    mini-stroke; memory deficits, slight  . TIA (transient ischemic attack)    Past Surgical History:  Procedure Laterality Date  . ABDOMINAL HYSTERECTOMY    . BIOPSY  02/23/2017   Procedure: BIOPSY;  Surgeon: Danie Binder, MD;  [facility];  Service: Endoscopy;;  duodenal bx's  . BLADDER SUSPENSION    . ESOPHAGOGASTRODUODENOSCOPY  02/2010   Dr. Barney Drain. Multiple duodenal ulcers, gastric erosions (bx negative for H.Pylori)  . ESOPHAGOGASTRODUODENOSCOPY N/A 06/19/2013   SLF: 1. Earlene Plater /dyspepsia due to uncontrolled GERD, & NSAID gastritis/duodenitis.  Marland Kitchen ESOPHAGOGASTRODUODENOSCOPY (EGD) WITH PROPOFOL N/A 02/23/2017   Procedure: ESOPHAGOGASTRODUODENOSCOPY (EGD) WITH PROPOFOL;  Surgeon: Danie Binder, MD;  [facility];  Service: Endoscopy;  Laterality: N/A;  8:15am  . Multiple cyst removal     coccyx, left ring finger, pelvic area & throat/vocal cord and pilonidal cyst  . RADIOLOGY WITH ANESTHESIA  03/23/2012   Procedure: RADIOLOGY WITH ANESTHESIA;  Surgeon: Rob Hickman, MD;  Location: [facility];  Service: Radiology;  Laterality: N/A;  . TOTAL VAGINAL HYSTERECTOMY     Social History   Socioeconomic History  . Marital status: Married    Spouse name: Sherren Mocha  . Number of children: 3  . Years of education: 32  .  Highest education level: Not on file  Occupational History  . Occupation: unemployed    Fish farm manager: UNEMPLOYED  Tobacco Use  . Smoking status: Current Every Day Smoker    Packs/day: 1.00    Years: 25.00    Pack years: 25.00    Types: Cigarettes  . Smokeless tobacco: Never Used  Vaping Use  . Vaping Use: Never used  Substance and Sexual Activity  . Alcohol use: Yes    Comment: once a week  . Drug use: No  . Sexual activity: Yes    Partners: Male    Birth control/protection: Surgical    Comment: spouse  Other Topics Concern  . Not on file  Social History Narrative   Lives with spouse and kids   Caffeine use:    Social Determinants of Health   Financial Resource Strain:   . Difficulty of Paying Living Expenses:   Food Insecurity:   . Worried About Charity fundraiser in the Last Year:   . Arboriculturist in the Last Year:   Transportation Needs:   . Film/video editor (Medical):   Marland Kitchen Lack of Transportation (Non-Medical):   Physical Activity:   . Days of Exercise per Week:   . Minutes of Exercise per Session:   Stress:   . Feeling of Stress :   Social Connections:   . Frequency of Communication with Friends and Family:   .  Frequency of Social Gatherings with Friends and Family:   . Attends Religious Services:   . Active Member of Clubs or Organizations:   . Attends Archivist Meetings:   Marland Kitchen Marital Status:    Outpatient Encounter Medications as of 10/31/2019  Medication Sig  . aspirin EC 81 MG tablet Take 81 mg by mouth daily.  . cabergoline (DOSTINEX) 0.5 MG tablet Take 0.5 tablets (0.25 mg total) by mouth once a week.  . Cholecalciferol (VITAMIN D) 50 MCG (2000 UT) CAPS Take 2,000 Units by mouth daily with breakfast.  . clopidogrel (PLAVIX) 75 MG tablet Take 1 tablet (75 mg total) by mouth daily.  Marland Kitchen estradiol (ESTRACE) 2 MG tablet TAKE 1 TABLET BY MOUTH ONCE DAILY.  Marland Kitchen Icosapent Ethyl (VASCEPA) 1 G CAPS Take 2 g by mouth 2 (two) times daily.   .  lansoprazole (PREVACID) 30 MG capsule TAKE 1 CAPSULE IN THE MORNING (1 HOUR BEFORE BREAKFAST)  . levothyroxine (SYNTHROID) 50 MCG tablet Take 1 tablet (50 mcg total) by mouth daily.  . Multiple Vitamin (MULTIVITAMIN) tablet Take 1 tablet by mouth daily.  . naproxen sodium (ALEVE) 220 MG tablet Take 220 mg by mouth daily as needed (pain).  . rosuvastatin (CRESTOR) 20 MG tablet Take 20 mg by mouth daily.  . [DISCONTINUED] albuterol (VENTOLIN HFA) 108 (90 Base) MCG/ACT inhaler Inhale 2 puffs into the lungs every 6 (six) hours as needed for wheezing or shortness of breath.  . [DISCONTINUED] cabergoline (DOSTINEX) 0.5 MG tablet TAKE 1/2 TABLET BY MOUTH TWICE A WEEK.  . [DISCONTINUED] lidocaine (LIDODERM) 5 % Place 1 patch onto the skin daily. Remove & Discard patch within 12 hours or as directed by MD  . [DISCONTINUED] ranitidine (ZANTAC) 300 MG tablet TAKE ONE TABLET BY MOUTH AT BEDTIME. (Patient not taking: Reported on 12/20/2018)   No facility-administered encounter medications on file as of 10/31/2019.   ALLERGIES: Allergies  Allergen Reactions  . Pantoprazole Sodium Itching and Rash  . Penicillins Itching and Rash    Has patient had a PCN reaction causing immediate rash, facial/tongue/throat swelling, SOB or lightheadedness with hypotension: Yes Has patient had a PCN reaction causing severe rash involving mucus membranes or skin necrosis: No Has patient had a PCN reaction that required hospitalization: No Has patient had a PCN reaction occurring within the last 10 years: No If all of the above answers are "NO", then may proceed with Cephalosporin use.   . Sulfa Antibiotics Itching and Rash  . Codeine Nausea And Vomiting  . Demerol [Meperidine]     Red streaks on arm after coadministration of phenergan and demerol at time of EGD in 2012. Tolerated phenergan with subsequent EGD.    VACCINATION STATUS:  There is no immunization history on file for this patient.  HPI 49 year old female  with medical history as above.   She is being seen in follow up for  hypothyroidism, hyperprolactinemia/pituitary microadenoma, and history of adrenal insufficiency. -She remains on low-dose cabergoline, 0.25 mg p.o. twice a week . 2019.  Her previsit labs show prolactin is undetectable to <1.0, overall improving from 37.6 in 11/2018.    -She has no new complaints today.  She also has hypothyroidism and   She is compliant with her levothyroxine 50 mcg p.o. every morning for hypothyroidism.       She denies nausea, vomiting.  Denies palpitations, tremors, hot flashes.  She has steady weight. -She remains off of steroids, she denies any recent hospitalization for adrenal crisis. - She  has history of leukocytosis/ thrombocytosis, bilateral carotid artery stenosis complicated by cerebrovascular accident.   Review of Systems  Limited as above. Objective:    BP 101/70   Pulse 82   Ht 5\' 2"  (1.575 m)   Wt 101 lb 12.8 oz (46.2 kg)   BMI 18.62 kg/m   Wt Readings from Last 3 Encounters:  10/31/19 101 lb 12.8 oz (46.2 kg)  12/20/18 101 lb 13.6 oz (46.2 kg)  06/24/18 107 lb 14.4 oz (48.9 kg)    Physical Exam CMP     Component Value Date/Time   NA 133 (L) 10/27/2019 1116   K 4.5 10/27/2019 1116   CL 98 10/27/2019 1116   CO2 24 10/27/2019 1116   GLUCOSE 86 10/27/2019 1116   BUN 6 (L) 10/27/2019 1116   BUN 8 05/18/2017 0000   CREATININE 0.63 10/27/2019 1116   CALCIUM 9.0 10/27/2019 1116   PROT 7.0 10/27/2019 1116   ALBUMIN 3.0 (L) 12/20/2018 1117   AST 21 10/27/2019 1116   ALT 11 10/27/2019 1116   ALKPHOS 92 12/20/2018 1117   BILITOT 0.4 10/27/2019 1116   GFRNONAA 105 10/27/2019 1116   GFRAA 122 10/27/2019 1116    Recent Results (from the past 2160 hour(s))  Prolactin     Status: Abnormal   Collection Time: 10/27/19 11:16 AM  Result Value Ref Range   Prolactin <1.0 (L) ng/mL    Comment:             Reference Range  Females         Non-pregnant        3.0-30.0         Pregnant            10.0-209.0         Postmenopausal      2.0-20.0 . . .   COMPLETE METABOLIC PANEL WITH GFR     Status: Abnormal   Collection Time: 10/27/19 11:16 AM  Result Value Ref Range   Glucose, Bld 86 65 - 99 mg/dL    Comment: .            Fasting reference interval .    BUN 6 (L) 7 - 25 mg/dL   Creat 0.63 0.50 - 1.10 mg/dL   GFR, Est Non African American 105 > OR = 60 mL/min/1.56m2   GFR, Est African American 122 > OR = 60 mL/min/1.67m2   BUN/Creatinine Ratio 10 6 - 22 (calc)   Sodium 133 (L) 135 - 146 mmol/L   Potassium 4.5 3.5 - 5.3 mmol/L   Chloride 98 98 - 110 mmol/L   CO2 24 20 - 32 mmol/L   Calcium 9.0 8.6 - 10.2 mg/dL   Total Protein 7.0 6.1 - 8.1 g/dL   Albumin 4.2 3.6 - 5.1 g/dL   Globulin 2.8 1.9 - 3.7 g/dL (calc)   AG Ratio 1.5 1.0 - 2.5 (calc)   Total Bilirubin 0.4 0.2 - 1.2 mg/dL   Alkaline phosphatase (APISO) 81 31 - 125 U/L   AST 21 10 - 35 U/L   ALT 11 6 - 29 U/L  TSH SOLSTAS     Status: Abnormal   Collection Time: 10/27/19 11:16 AM  Result Value Ref Range   TSH 0.35 (L) mIU/L    Comment:           Reference Range .           > or = 20 Years  0.40-4.50 .  Pregnancy Ranges           First trimester    0.26-2.66           Second trimester   0.55-2.73           Third trimester    0.43-2.91   T4, free SOLSTAS     Status: None   Collection Time: 10/27/19 11:16 AM  Result Value Ref Range   Free T4 1.2 0.8 - 1.8 ng/dL      Labs from 02/05/2016 showed TSH 1.68, free T4 0.49   Labs from 12/21/2016: TSH 1.7, free T4 0.79 (normal 0.8-1 277), sodium 140, potassium 5.4, leukocytosis at 13, normal lipid panel.  Labs from December 06, 2018: A1c 5.5%, TSH 0.83, prolactin increased 37.6  Assessment & Plan:   1. Hypothyroidism 2.  History of adrenal insufficiency ([place]) 3. Hyperprolactinemia ([place]) 4. Pituitary microadenoma ([place])   - She has long and complicated medical history including what appears to be steroid induced adrenal  insufficiency which required up to 7.5 mg of prednisone daily for at least 6 years, until she decided to stop prednisone in February 2018. She denied any interval hospitalizations or adrenal crisis.    -Her most recent a.m. cortisol is reassuring at 13.2.  She will not require adrenal intervention at this time.  she will need a.m. cortisol at least every year for reevaluation.  - Regarding her history of hyperprolactinemia   -Her previsit labs show improvement in her PRL to <1.0 from  37.6 , she will be continued on treatment with cabergoline.  She agrees with plan to continue cabergoline 0.25 mg once weekly with plan to measure in 4 months.    Her hyperprolactinemia  is likely related to history microadenoma (7 mm pituitary adenoma based on MRI in 2012).  Her 2014 pituitary/sella MRI was significant for 7 mm pituitary microadenoma.  She will not require any surveillance MRI of sella/pituitary this time.  Regarding her hypothyroidism: -Her previsit thyroid function tests are consistent with appropriate replacement.  She is advised to continue levothyroxine 50 mcg p.o. daily before.     - We discussed about the correct intake of her thyroid hormone, on empty stomach at fasting, with water, separated by at least 30 minutes from breakfast and other medications,  and separated by more than 4 hours from calcium, iron, multivitamins, acid reflux medications (PPIs). -Patient is made aware of the fact that thyroid hormone replacement is needed for life, dose to be adjusted by periodic monitoring of thyroid function tests.   Regarding her vitamin D deficiency: she is advised to continue vitamin D 2000 units daily for maintenance.  - I advised patient to maintain close follow up with Sander Radon, NP for primary care needs.      - Time spent on this patient care encounter:  20 minutes of which 50% was spent in  counseling and the rest reviewing  her current and  previous labs / studies and  medications  doses and developing a plan for long term care. Juanetta Gosling  participated in the discussions, expressed understanding, and voiced agreement with the above plans.  All questions were answered to her satisfaction. she is encouraged to contact clinic should she have any questions or concerns prior to her return visit.   Follow up plan: Return in about 4 months (around 03/02/2020) for F/U with Pre-visit Labs.  Glade Lloyd, MD Phone: [phone]  Fax: [phone]  This note was partially dictated with voice recognition  software. Similar sounding words can be transcribed inadequately or may not  be corrected upon review.  10/31/2019, 10:50 AM

## 2019-11-26 ENCOUNTER — Other Ambulatory Visit: Payer: Self-pay | Admitting: "Endocrinology

## 2020-02-28 LAB — VITAMIN D 25 HYDROXY (VIT D DEFICIENCY, FRACTURES): Vit D, 25-Hydroxy: 39.5 ng/mL (ref 30.0–100.0)

## 2020-02-28 LAB — PROLACTIN: Prolactin: 0.4 ng/mL — ABNORMAL LOW (ref 4.8–23.3)

## 2020-02-28 LAB — TSH: TSH: 0.319 u[IU]/mL — ABNORMAL LOW (ref 0.450–4.500)

## 2020-02-28 LAB — T4, FREE: Free T4: 1.34 ng/dL (ref 0.82–1.77)

## 2020-03-04 ENCOUNTER — Other Ambulatory Visit: Payer: Self-pay

## 2020-03-04 ENCOUNTER — Encounter: Payer: Self-pay | Admitting: "Endocrinology

## 2020-03-04 ENCOUNTER — Ambulatory Visit: Payer: BC Managed Care – PPO | Admitting: "Endocrinology

## 2020-03-04 VITALS — BP 98/62 | HR 72 | Ht 62.0 in | Wt 94.4 lb

## 2020-03-04 DIAGNOSIS — E038 Other specified hypothyroidism: Secondary | ICD-10-CM | POA: Diagnosis not present

## 2020-03-04 DIAGNOSIS — E559 Vitamin D deficiency, unspecified: Secondary | ICD-10-CM

## 2020-03-04 DIAGNOSIS — E221 Hyperprolactinemia: Secondary | ICD-10-CM

## 2020-03-04 DIAGNOSIS — D352 Benign neoplasm of pituitary gland: Secondary | ICD-10-CM

## 2020-03-04 MED ORDER — LEVOTHYROXINE SODIUM 75 MCG PO TABS: 37.5000 ug | ORAL_TABLET | Freq: Every day | ORAL | 1 refills | Status: DC

## 2020-03-04 NOTE — Progress Notes (Signed)
03/04/2020    Endocrinology follow-up note   Subjective:    Patient ID: [name], female    DOB: 04-May-1970, PCP Sander Radon, NP   Past Medical History:  Diagnosis Date  . Bleeding ulcer   . Bronchitis    hx of  . Carotid artery disease ([place])    bilateral internal carotid artery occlusion by duplex ultrasound  . Cervical cancer ([facility])    cervical  . Chronic headaches   . Common migraine with intractable migraine 02/22/2017  . GERD (gastroesophageal reflux disease)   . Hyperlipidemia   . Hypothyroidism   . Pituitary tumor   . PUD (peptic ulcer disease)    NSAIDS   . Stenosis of right carotid artery   . Stroke ([place])    mini-stroke; memory deficits, slight  . TIA (transient ischemic attack)    Past Surgical History:  Procedure Laterality Date  . ABDOMINAL HYSTERECTOMY    . BIOPSY  02/23/2017   Procedure: BIOPSY;  Surgeon: Danie Binder, MD;  [facility];  Service: Endoscopy;;  duodenal bx's  . BLADDER SUSPENSION    . ESOPHAGOGASTRODUODENOSCOPY  02/2010   Dr. Barney Drain. Multiple duodenal ulcers, gastric erosions (bx negative for H.Pylori)  . ESOPHAGOGASTRODUODENOSCOPY N/A 06/19/2013   SLF: 1. Earlene Plater /dyspepsia due to uncontrolled GERD, & NSAID gastritis/duodenitis.  Marland Kitchen ESOPHAGOGASTRODUODENOSCOPY (EGD) WITH PROPOFOL N/A 02/23/2017   Procedure: ESOPHAGOGASTRODUODENOSCOPY (EGD) WITH PROPOFOL;  Surgeon: Danie Binder, MD;  [facility];  Service: Endoscopy;  Laterality: N/A;  8:15am  . Multiple cyst removal     coccyx, left ring finger, pelvic area & throat/vocal cord and pilonidal cyst  . RADIOLOGY WITH ANESTHESIA  03/23/2012   Procedure: RADIOLOGY WITH ANESTHESIA;  Surgeon: Rob Hickman, MD;  Location: [facility];  Service: Radiology;  Laterality: N/A;  . TOTAL VAGINAL HYSTERECTOMY     Social History   Socioeconomic History  . Marital status: Married    Spouse name: Sherren Mocha  . Number of children: 3  . Years of education: 52  .  Highest education level: Not on file  Occupational History  . Occupation: unemployed    Fish farm manager: UNEMPLOYED  Tobacco Use  . Smoking status: Current Every Day Smoker    Packs/day: 1.00    Years: 25.00    Pack years: 25.00    Types: Cigarettes  . Smokeless tobacco: Never Used  Vaping Use  . Vaping Use: Never used  Substance and Sexual Activity  . Alcohol use: Yes    Comment: once a week  . Drug use: No  . Sexual activity: Yes    Partners: Male    Birth control/protection: Surgical    Comment: spouse  Other Topics Concern  . Not on file  Social History Narrative   Lives with spouse and kids   Caffeine use:    Social Determinants of Health   Financial Resource Strain:   . Difficulty of Paying Living Expenses: Not on file  Food Insecurity:   . Worried About Charity fundraiser in the Last Year: Not on file  . Ran Out of Food in the Last Year: Not on file  Transportation Needs:   . Lack of Transportation (Medical): Not on file  . Lack of Transportation (Non-Medical): Not on file  Physical Activity:   . Days of Exercise per Week: Not on file  . Minutes of Exercise per Session: Not on file  Stress:   . Feeling of Stress : Not on  file  Social Connections:   . Frequency of Communication with Friends and Family: Not on file  . Frequency of Social Gatherings with Friends and Family: Not on file  . Attends Religious Services: Not on file  . Active Member of Clubs or Organizations: Not on file  . Attends Archivist Meetings: Not on file  . Marital Status: Not on file   Outpatient Encounter Medications as of 03/04/2020  Medication Sig  . aspirin EC 81 MG tablet Take 81 mg by mouth daily.  . cabergoline (DOSTINEX) 0.5 MG tablet Take 0.5 tablets (0.25 mg total) by mouth once a week.  . Cholecalciferol (VITAMIN D) 50 MCG (2000 UT) CAPS Take 2,000 Units by mouth daily with breakfast.  . clopidogrel (PLAVIX) 75 MG tablet Take 1 tablet (75 mg total) by mouth daily.  Marland Kitchen  estradiol (ESTRACE) 2 MG tablet TAKE 1 TABLET BY MOUTH ONCE DAILY.  Marland Kitchen Icosapent Ethyl (VASCEPA) 1 G CAPS Take 2 g by mouth 2 (two) times daily.   . lansoprazole (PREVACID) 30 MG capsule TAKE 1 CAPSULE IN THE MORNING (1 HOUR BEFORE BREAKFAST)  . levothyroxine (SYNTHROID) 75 MCG tablet Take 0.5 tablets (37.5 mcg total) by mouth daily before breakfast.  . Multiple Vitamin (MULTIVITAMIN) tablet Take 1 tablet by mouth daily.  . naproxen sodium (ALEVE) 220 MG tablet Take 220 mg by mouth daily as needed (pain).  . rosuvastatin (CRESTOR) 20 MG tablet Take 20 mg by mouth daily.  . [DISCONTINUED] levothyroxine (SYNTHROID) 50 MCG tablet TAKE 1 TABLET BY MOUTH ONCE DAILY.   No facility-administered encounter medications on file as of 03/04/2020.   ALLERGIES: Allergies  Allergen Reactions  . Pantoprazole Sodium Itching and Rash  . Penicillins Itching and Rash    Has patient had a PCN reaction causing immediate rash, facial/tongue/throat swelling, SOB or lightheadedness with hypotension: Yes Has patient had a PCN reaction causing severe rash involving mucus membranes or skin necrosis: No Has patient had a PCN reaction that required hospitalization: No Has patient had a PCN reaction occurring within the last 10 years: No If all of the above answers are "NO", then may proceed with Cephalosporin use.   . Sulfa Antibiotics Itching and Rash  . Codeine Nausea And Vomiting  . Demerol [Meperidine]     Red streaks on arm after coadministration of phenergan and demerol at time of EGD in 2012. Tolerated phenergan with subsequent EGD.    VACCINATION STATUS:  There is no immunization history on file for this patient.  HPI 49 year old female with medical history as above.   She is being seen in follow up for  hypothyroidism, hyperprolactinemia/pituitary microadenoma, and history of adrenal insufficiency. -She remains on low-dose cabergoline, 0.25 mg p.o. once a week .   Her previsit labs show prolactin is  controlled at 0.4 ,  overall improving from 37.6 in 11/2018.    -She has no new complaints today.  She also has hypothyroidism and   She is compliant with her levothyroxine 50 mcg p.o. every morning for hypothyroidism.       She denies nausea, vomiting.  Denies palpitations, tremors, hot flashes.  She has steady weight. -She remains off of steroids, she denies any recent hospitalization for adrenal crisis. - She has history of leukocytosis/ thrombocytosis, bilateral carotid artery stenosis complicated by cerebrovascular accident.   Review of Systems  Limited as above. Objective:    BP 98/62   Pulse 72   Ht 5\' 2"  (1.575 m)   Wt 94 lb  6.4 oz (42.8 kg)   BMI 17.27 kg/m   Wt Readings from Last 3 Encounters:  03/04/20 94 lb 6.4 oz (42.8 kg)  10/31/19 101 lb 12.8 oz (46.2 kg)  12/20/18 101 lb 13.6 oz (46.2 kg)    Physical Exam CMP     Component Value Date/Time   NA 133 (L) 10/27/2019 1116   K 4.5 10/27/2019 1116   CL 98 10/27/2019 1116   CO2 24 10/27/2019 1116   GLUCOSE 86 10/27/2019 1116   BUN 6 (L) 10/27/2019 1116   BUN 8 05/18/2017 0000   CREATININE 0.63 10/27/2019 1116   CALCIUM 9.0 10/27/2019 1116   PROT 7.0 10/27/2019 1116   ALBUMIN 3.0 (L) 12/20/2018 1117   AST 21 10/27/2019 1116   ALT 11 10/27/2019 1116   ALKPHOS 92 12/20/2018 1117   BILITOT 0.4 10/27/2019 1116   GFRNONAA 105 10/27/2019 1116   GFRAA 122 10/27/2019 1116    Recent Results (from the past 2160 hour(s))  TSH     Status: Abnormal   Collection Time: 02/27/20 11:40 AM  Result Value Ref Range   TSH 0.319 (L) 0.450 - 4.500 uIU/mL  T4, free     Status: None   Collection Time: 02/27/20 11:40 AM  Result Value Ref Range   Free T4 1.34 0.82 - 1.77 ng/dL  Prolactin     Status: Abnormal   Collection Time: 02/27/20 11:40 AM  Result Value Ref Range   Prolactin 0.4 (L) 4.8 - 23.3 ng/mL  VITAMIN D 25 Hydroxy (Vit-D Deficiency, Fractures)     Status: None   Collection Time: 02/27/20 11:40 AM  Result Value  Ref Range   Vit D, 25-Hydroxy 39.5 30.0 - 100.0 ng/mL    Comment: Vitamin D deficiency has been defined by the [place] and an Endocrine Society practice guideline as a level of serum 25-OH vitamin D less than 20 ng/mL (1,2). The Endocrine Society went on to further define vitamin D insufficiency as a level between 21 and 29 ng/mL (2). 1. IOM (Institute of Medicine). 2010. Dietary reference    intakes for calcium and D. [place]: The    [employer]. 2. Holick MF, Binkley Sobieski, Bischoff-Ferrari HA, et al.    Evaluation, treatment, and prevention of vitamin D    deficiency: an Endocrine Society clinical practice    guideline. JCEM. 2011 Jul; 96(7):1911-30.       Labs from 02/05/2016 showed TSH 1.68, free T4 0.49   Labs from 12/21/2016: TSH 1.7, free T4 0.79 (normal 0.8-1 277), sodium 140, potassium 5.4, leukocytosis at 13, normal lipid panel.  Labs from December 06, 2018: A1c 5.5%, TSH 0.83, prolactin increased 37.6  Assessment & Plan:   1. Hypothyroidism 2.  History of adrenal insufficiency ([place]) 3. Hyperprolactinemia ([place]) 4. Pituitary microadenoma ([place])   - She has long and complicated medical history including what appears to be steroid induced adrenal insufficiency which required up to 7.5 mg of prednisone daily for at least 6 years, until she decided to stop prednisone in February 2018. She denied any interval hospitalizations or adrenal crisis.    -Her most recent a.m. cortisol is reassuring at 13.2.  She will not require adrenal intervention at this time.  she will need a.m. cortisol at least every year for reevaluation.  - Regarding her history of hyperprolactinemia   -Her previsit labs show improvement in her PRL to  0.4 improving from from  37.6 , she will be continued on treatment with cabergoline.  She agrees with plan to continue cabergoline 0.25 mg once weekly with plan to measure in 6 months.    Her hyperprolactinemia  is likely  related to history microadenoma (7 mm pituitary adenoma based on MRI in 2012).  Her 2014 pituitary/sella MRI was significant for 7 mm pituitary microadenoma.  She will not require any surveillance MRI of sella/pituitary this time.  Regarding her hypothyroidism: -Her previsit thyroid function tests are consistent with slight over-replacement.  She is advised to lower levothyroxine to 37.5 mcg p.o. daily before.     - We discussed about the correct intake of her thyroid hormone, on empty stomach at fasting, with water, separated by at least 30 minutes from breakfast and other medications,  and separated by more than 4 hours from calcium, iron, multivitamins, acid reflux medications (PPIs). -Patient is made aware of the fact that thyroid hormone replacement is needed for life, dose to be adjusted by periodic monitoring of thyroid function tests.    Regarding her vitamin D deficiency: she is advised to continue vitamin D 2000 units daily for maintenance.  - I advised patient to maintain close follow up with Sander Radon, NP for primary care needs.       - Time spent on this patient care encounter:  20 minutes of which 50% was spent in  counseling and the rest reviewing  her current and  previous labs / studies and medications  doses and developing a plan for long term care. Juanetta Gosling  participated in the discussions, expressed understanding, and voiced agreement with the above plans.  All questions were answered to her satisfaction. she is encouraged to contact clinic should she have any questions or concerns prior to her return visit.   Follow up plan: Return in about 6 months (around 09/01/2020) for F/U with Pre-visit Labs, DXA Scan B4 NV.  Glade Lloyd, MD Phone: [phone]  Fax: [phone]  This note was partially dictated with voice recognition software. Similar sounding words can be transcribed inadequately or may not  be corrected upon review.  03/04/2020, 1:07 PM

## 2020-05-27 ENCOUNTER — Other Ambulatory Visit: Payer: Self-pay | Admitting: "Endocrinology

## 2020-06-25 ENCOUNTER — Other Ambulatory Visit: Payer: Self-pay | Admitting: "Endocrinology

## 2020-07-24 ENCOUNTER — Other Ambulatory Visit: Payer: Self-pay | Admitting: "Endocrinology

## 2020-08-26 ENCOUNTER — Telehealth: Payer: Self-pay | Admitting: "Endocrinology

## 2020-08-26 NOTE — Telephone Encounter (Signed)
Pt is sch for next week for f/u with dexa scan but I do not see she has been sch. Can you check on this.

## 2020-08-27 NOTE — Telephone Encounter (Signed)
Kim tried to call patient, no answer/ pt is sch for dexa 5/25 and needs to push her appt with [place] after the 25th. Waiting for call back

## 2020-08-28 LAB — TSH: TSH: 0.578 u[IU]/mL (ref 0.450–4.500)

## 2020-08-28 LAB — T4, FREE: Free T4: 1.1 ng/dL (ref 0.82–1.77)

## 2020-08-28 LAB — PROLACTIN: Prolactin: 9.6 ng/mL (ref 4.8–23.3)

## 2020-09-02 ENCOUNTER — Ambulatory Visit: Payer: BC Managed Care – PPO | Admitting: "Endocrinology

## 2020-09-04 ENCOUNTER — Ambulatory Visit (HOSPITAL_COMMUNITY)
Admission: RE | Admit: 2020-09-04 | Discharge: 2020-09-04 | Disposition: A | Discharge status: Home / Self Care | Payer: BC Managed Care – PPO | Source: Ambulatory Visit | Attending: "Endocrinology | Admitting: "Endocrinology

## 2020-09-04 ENCOUNTER — Other Ambulatory Visit: Payer: Self-pay

## 2020-09-04 DIAGNOSIS — E559 Vitamin D deficiency, unspecified: Secondary | ICD-10-CM | POA: Diagnosis present

## 2020-09-04 DIAGNOSIS — E221 Hyperprolactinemia: Secondary | ICD-10-CM | POA: Insufficient documentation

## 2020-09-10 ENCOUNTER — Ambulatory Visit: Payer: BC Managed Care – PPO | Admitting: "Endocrinology

## 2020-09-10 ENCOUNTER — Encounter: Payer: Self-pay | Admitting: "Endocrinology

## 2020-09-10 ENCOUNTER — Other Ambulatory Visit: Payer: Self-pay

## 2020-09-10 VITALS — BP 94/58 | HR 88 | Ht 62.0 in | Wt 96.4 lb

## 2020-09-10 DIAGNOSIS — E038 Other specified hypothyroidism: Secondary | ICD-10-CM

## 2020-09-10 DIAGNOSIS — E559 Vitamin D deficiency, unspecified: Secondary | ICD-10-CM

## 2020-09-10 DIAGNOSIS — D352 Benign neoplasm of pituitary gland: Secondary | ICD-10-CM

## 2020-09-10 DIAGNOSIS — E221 Hyperprolactinemia: Secondary | ICD-10-CM

## 2020-09-10 MED ORDER — CABERGOLINE 0.5 MG PO TABS: 0.2500 mg | ORAL_TABLET | ORAL | 1 refills | Status: DC

## 2020-09-10 MED ORDER — LEVOTHYROXINE SODIUM 75 MCG PO TABS: ORAL_TABLET | ORAL | 1 refills | Status: DC

## 2020-09-10 NOTE — Progress Notes (Signed)
09/10/2020    Endocrinology follow-up note   Subjective:    Patient ID: [name], female    DOB: 29-Jan-1971, PCP Sander Radon, NP   Past Medical History:  Diagnosis Date  . Bleeding ulcer   . Bronchitis    hx of  . Carotid artery disease ([place])    bilateral internal carotid artery occlusion by duplex ultrasound  . Cervical cancer ([facility])    cervical  . Chronic headaches   . Common migraine with intractable migraine 02/22/2017  . GERD (gastroesophageal reflux disease)   . Hyperlipidemia   . Hypothyroidism   . Pituitary tumor   . PUD (peptic ulcer disease)    NSAIDS   . Stenosis of right carotid artery   . Stroke ([place])    mini-stroke; memory deficits, slight  . TIA (transient ischemic attack)    Past Surgical History:  Procedure Laterality Date  . ABDOMINAL HYSTERECTOMY    . BIOPSY  02/23/2017   Procedure: BIOPSY;  Surgeon: Danie Binder, MD;  [facility];  Service: Endoscopy;;  duodenal bx's  . BLADDER SUSPENSION    . ESOPHAGOGASTRODUODENOSCOPY  02/2010   Dr. Barney Drain. Multiple duodenal ulcers, gastric erosions (bx negative for H.Pylori)  . ESOPHAGOGASTRODUODENOSCOPY N/A 06/19/2013   SLF: 1. Earlene Plater /dyspepsia due to uncontrolled GERD, & NSAID gastritis/duodenitis.  Marland Kitchen ESOPHAGOGASTRODUODENOSCOPY (EGD) WITH PROPOFOL N/A 02/23/2017   Procedure: ESOPHAGOGASTRODUODENOSCOPY (EGD) WITH PROPOFOL;  Surgeon: Danie Binder, MD;  [facility];  Service: Endoscopy;  Laterality: N/A;  8:15am  . Multiple cyst removal     coccyx, left ring finger, pelvic area & throat/vocal cord and pilonidal cyst  . RADIOLOGY WITH ANESTHESIA  03/23/2012   Procedure: RADIOLOGY WITH ANESTHESIA;  Surgeon: Rob Hickman, MD;  Location: [facility];  Service: Radiology;  Laterality: N/A;  . TOTAL VAGINAL HYSTERECTOMY     Social History   Socioeconomic History  . Marital status: Married    Spouse name: Sherren Mocha  . Number of children: 3  . Years of education: 48  .  Highest education level: Not on file  Occupational History  . Occupation: unemployed    Fish farm manager: UNEMPLOYED  Tobacco Use  . Smoking status: Current Every Day Smoker    Packs/day: 1.00    Years: 25.00    Pack years: 25.00    Types: Cigarettes  . Smokeless tobacco: Never Used  Vaping Use  . Vaping Use: Never used  Substance and Sexual Activity  . Alcohol use: Yes    Comment: once a week  . Drug use: No  . Sexual activity: Yes    Partners: Male    Birth control/protection: Surgical    Comment: spouse  Other Topics Concern  . Not on file  Social History Narrative   Lives with spouse and kids   Caffeine use:    Social Determinants of Health   Financial Resource Strain: Not on file  Food Insecurity: Not on file  Transportation Needs: Not on file  Physical Activity: Not on file  Stress: Not on file  Social Connections: Not on file   Outpatient Encounter Medications as of 09/10/2020  Medication Sig  . aspirin EC 81 MG tablet Take 81 mg by mouth daily.  Derrill Memo ON 09/12/2020] cabergoline (DOSTINEX) 0.5 MG tablet Take 0.5 tablets (0.25 mg total) by mouth 2 (two) times a week.  . Cholecalciferol (VITAMIN D) 50 MCG (2000 UT) CAPS Take 2,000 Units by mouth daily with breakfast.  . clopidogrel (  PLAVIX) 75 MG tablet Take 1 tablet (75 mg total) by mouth daily.  Marland Kitchen estradiol (ESTRACE) 2 MG tablet TAKE 1 TABLET BY MOUTH ONCE DAILY.  Marland Kitchen Icosapent Ethyl (VASCEPA) 1 G CAPS Take 2 g by mouth 2 (two) times daily.   . lansoprazole (PREVACID) 30 MG capsule TAKE 1 CAPSULE IN THE MORNING (1 HOUR BEFORE BREAKFAST)  . levothyroxine (SYNTHROID) 75 MCG tablet TAKE 1/2 TABLET IN THE MORNING BEFORE BREAKFAST  . Multiple Vitamin (MULTIVITAMIN) tablet Take 1 tablet by mouth daily.  . naproxen sodium (ALEVE) 220 MG tablet Take 220 mg by mouth daily as needed (pain).  . rosuvastatin (CRESTOR) 20 MG tablet Take 20 mg by mouth daily.  . [DISCONTINUED] cabergoline (DOSTINEX) 0.5 MG tablet TAKE 1/2 TABLET BY  MOUTH ONCE A WEEK.  . [DISCONTINUED] levothyroxine (SYNTHROID) 75 MCG tablet TAKE 1/2 TABLET IN THE MORNING BEFORE BREAKFAST   No facility-administered encounter medications on file as of 09/10/2020.   ALLERGIES: Allergies  Allergen Reactions  . Pantoprazole Sodium Itching and Rash  . Penicillins Itching and Rash    Has patient had a PCN reaction causing immediate rash, facial/tongue/throat swelling, SOB or lightheadedness with hypotension: Yes Has patient had a PCN reaction causing severe rash involving mucus membranes or skin necrosis: No Has patient had a PCN reaction that required hospitalization: No Has patient had a PCN reaction occurring within the last 10 years: No If all of the above answers are "NO", then may proceed with Cephalosporin use.   . Sulfa Antibiotics Itching and Rash  . Codeine Nausea And Vomiting  . Demerol [Meperidine]     Red streaks on arm after coadministration of phenergan and demerol at time of EGD in 2012. Tolerated phenergan with subsequent EGD.    VACCINATION STATUS:  There is no immunization history on file for this patient.  HPI 50 year old female with medical history as above.     She is being seen in follow up for  hypothyroidism, hyperprolactinemia/pituitary microadenoma, and history of adrenal insufficiency. -She remains on low-dose cabergoline, 0.25 mg p.o. once a week .   Her previsit labs show prolactin is up at 9.6 from 0.4.  Overall is improving from 37.6 in August 2020.     -She has no new complaints today.  She also has hypothyroidism and   She is compliant with her levothyroxine 37.5 mcg p.o. daily before breakfast.     She denies nausea, vomiting.  Denies palpitations, tremors, hot flashes.  She has steady weight. -She remains off of steroids, she denies any recent hospitalization for adrenal crisis. - She has history of leukocytosis/ thrombocytosis, bilateral carotid artery stenosis complicated by cerebrovascular accident.   Review  of Systems  Limited as above. Objective:    BP (!) 94/58   Pulse 88   Ht 5\' 2"  (1.575 m)   Wt 96 lb 6.4 oz (43.7 kg)   BMI 17.63 kg/m   Wt Readings from Last 3 Encounters:  09/10/20 96 lb 6.4 oz (43.7 kg)  03/04/20 94 lb 6.4 oz (42.8 kg)  10/31/19 101 lb 12.8 oz (46.2 kg)    Physical Exam CMP     Component Value Date/Time   NA 133 (L) 10/27/2019 1116   K 4.5 10/27/2019 1116   CL 98 10/27/2019 1116   CO2 24 10/27/2019 1116   GLUCOSE 86 10/27/2019 1116   BUN 6 (L) 10/27/2019 1116   BUN 8 05/18/2017 0000   CREATININE 0.63 10/27/2019 1116   CALCIUM 9.0 10/27/2019 1116  PROT 7.0 10/27/2019 1116   ALBUMIN 3.0 (L) 12/20/2018 1117   AST 21 10/27/2019 1116   ALT 11 10/27/2019 1116   ALKPHOS 92 12/20/2018 1117   BILITOT 0.4 10/27/2019 1116   GFRNONAA 105 10/27/2019 1116   GFRAA 122 10/27/2019 1116    Recent Results (from the past 2160 hour(s))  Prolactin     Status: None   Collection Time: 08/27/20  1:13 PM  Result Value Ref Range   Prolactin 9.6 4.8 - 23.3 ng/mL  TSH     Status: None   Collection Time: 08/27/20  1:13 PM  Result Value Ref Range   TSH 0.578 0.450 - 4.500 uIU/mL  T4, free     Status: None   Collection Time: 08/27/20  1:13 PM  Result Value Ref Range   Free T4 1.10 0.82 - 1.77 ng/dL      Labs from 02/05/2016 showed TSH 1.68, free T4 0.49   Labs from 12/21/2016: TSH 1.7, free T4 0.79 (normal 0.8-1 277), sodium 140, potassium 5.4, leukocytosis at 13, normal lipid panel.  Labs from December 06, 2018: A1c 5.5%, TSH 0.83, prolactin increased 37.6  Assessment & Plan:   1. Hypothyroidism 2.  History of adrenal insufficiency ([place]) 3. Hyperprolactinemia ([place]) 4. Pituitary microadenoma ([place])   - She has long and complicated medical history including what appears to be steroid induced adrenal insufficiency which required up to 7.5 mg of prednisone daily for at least 6 years, until she decided to stop prednisone in February 2018. She denied any interval  hospitalizations or adrenal crisis.    -Her most recent a.m. cortisol is reassuring at 13.2.  She will not require steroid supplement at this time.   She is advised on the need for yearly evaluation for adrenal sufficiency.   - Regarding her history of hyperprolactinemia   -Her previsit labs show increase in her prolactin to 9.6 from 0.4.  Overall improving from 37.6, she will continue to need intervention with cabergoline.  She is advised to increase her cabergoline to 0.25 mg twice a week with plan to repeat labs in 6 months.     Her hyperprolactinemia  is likely related to history microadenoma (7 mm pituitary adenoma based on MRI in 2012).  Her 2014 pituitary/sella MRI was significant for 7 mm pituitary microadenoma.  She will not require any surveillance MRI of sella/pituitary this time.  Regarding her hypothyroidism: -Her previsit thyroid function tests are consistent with appropriate replacement.  She is advised to continue levothyroxine 37.5 mcg daily before breakfast.  - We discussed about the correct intake of her thyroid hormone, on empty stomach at fasting, with water, separated by at least 30 minutes from breakfast and other medications,  and separated by more than 4 hours from calcium, iron, multivitamins, acid reflux medications (PPIs). -Patient is made aware of the fact that thyroid hormone replacement is needed for life, dose to be adjusted by periodic monitoring of thyroid function tests.    Regarding her vitamin D deficiency: she is advised to continue vitamin D 2000 units daily for maintenance.  Her screening bone density is negative for osteopia/osteoporosis.  This is a screening will be repeated every 5 years.  - I advised patient to maintain close follow up with Sander Radon, NP for primary care needs.    I spent 30 minutes in the care of the patient today including review of labs from Thyroid Function, CMP, and other relevant labs ; imaging/biopsy records  (current and previous including abstractions  from other facilities); face-to-face time discussing  her lab results and symptoms, medications doses, her options of short and long term treatment based on the latest standards of care / guidelines;   and documenting the encounter.  Juanetta Gosling  participated in the discussions, expressed understanding, and voiced agreement with the above plans.  All questions were answered to her satisfaction. she is encouraged to contact clinic should she have any questions or concerns prior to her return visit.   Follow up plan: Return in about 6 months (around 03/12/2021) for F/U with Pre-visit Labs.  Glade Lloyd, MD Phone: [phone]  Fax: [phone]  This note was partially dictated with voice recognition software. Similar sounding words can be transcribed inadequately or may not  be corrected upon review.  09/10/2020, 5:23 PM

## 2020-10-18 ENCOUNTER — Other Ambulatory Visit: Payer: Self-pay | Admitting: Obstetrics & Gynecology

## 2020-10-22 NOTE — Telephone Encounter (Signed)
Pt states that the pharmacy has been sending refills for this medication for 4 days with no response from Dr. Elonda Husky. Please refill this medication ASAP. Please contact pt

## 2021-02-04 ENCOUNTER — Emergency Department (HOSPITAL_COMMUNITY)
Admission: EM | Admit: 2021-02-04 | Discharge: 2021-02-04 | Disposition: A | Discharge status: Home / Self Care | Payer: BC Managed Care – PPO | Attending: Emergency Medicine | Admitting: Emergency Medicine

## 2021-02-04 DIAGNOSIS — R519 Headache, unspecified: Secondary | ICD-10-CM | POA: Diagnosis not present

## 2021-02-04 DIAGNOSIS — R202 Paresthesia of skin: Secondary | ICD-10-CM | POA: Diagnosis not present

## 2021-02-04 DIAGNOSIS — Z5321 Procedure and treatment not carried out due to patient leaving prior to being seen by health care provider: Secondary | ICD-10-CM | POA: Diagnosis not present

## 2021-02-04 NOTE — ED Triage Notes (Signed)
Patient has multiple complaints, states she has headaches. Referred here by primary PCP

## 2021-03-10 ENCOUNTER — Other Ambulatory Visit: Payer: Self-pay | Admitting: "Endocrinology

## 2021-03-13 ENCOUNTER — Ambulatory Visit: Payer: BC Managed Care – PPO | Admitting: "Endocrinology

## 2021-03-13 LAB — TSH: TSH: 0.647 u[IU]/mL (ref 0.450–4.500)

## 2021-03-13 LAB — T4, FREE: Free T4: 1.11 ng/dL (ref 0.82–1.77)

## 2021-03-13 LAB — PROLACTIN: Prolactin: 0.2 ng/mL — ABNORMAL LOW (ref 4.8–23.3)

## 2021-03-17 ENCOUNTER — Ambulatory Visit: Payer: BC Managed Care – PPO | Admitting: "Endocrinology

## 2021-03-17 ENCOUNTER — Other Ambulatory Visit: Payer: Self-pay

## 2021-03-17 ENCOUNTER — Encounter: Payer: Self-pay | Admitting: "Endocrinology

## 2021-03-17 VITALS — BP 92/64 | HR 80 | Ht 62.0 in | Wt 92.0 lb

## 2021-03-17 DIAGNOSIS — E221 Hyperprolactinemia: Secondary | ICD-10-CM | POA: Diagnosis not present

## 2021-03-17 DIAGNOSIS — E2740 Unspecified adrenocortical insufficiency: Secondary | ICD-10-CM

## 2021-03-17 DIAGNOSIS — E038 Other specified hypothyroidism: Secondary | ICD-10-CM

## 2021-03-17 NOTE — Progress Notes (Signed)
03/17/2021    Endocrinology follow-up note   Subjective:    Patient ID: [name], female    DOB: [birth year], PCP Coolidge Breeze, FNP   Past Medical History:  Diagnosis Date   Bleeding ulcer    Bronchitis    hx of   Carotid artery disease ([place])    bilateral internal carotid artery occlusion by duplex ultrasound   Cervical cancer ([facility])    cervical   Chronic headaches    Common migraine with intractable migraine 02/22/2017   GERD (gastroesophageal reflux disease)    Hyperlipidemia    Hypothyroidism    Pituitary tumor    PUD (peptic ulcer disease)    NSAIDS    Stenosis of right carotid artery    Stroke ([place])    mini-stroke; memory deficits, slight   TIA (transient ischemic attack)    Past Surgical History:  Procedure Laterality Date   ABDOMINAL HYSTERECTOMY     BIOPSY  02/23/2017   Procedure: BIOPSY;  Surgeon: Danie Binder, MD;  [facility];  Service: Endoscopy;;  duodenal bx's   BLADDER SUSPENSION     ESOPHAGOGASTRODUODENOSCOPY  02/2010   Dr. Barney Drain. Multiple duodenal ulcers, gastric erosions (bx negative for H.Pylori)   ESOPHAGOGASTRODUODENOSCOPY N/A 06/19/2013   SLF: 1. Earlene Plater /dyspepsia due to uncontrolled GERD, & NSAID gastritis/duodenitis.   ESOPHAGOGASTRODUODENOSCOPY (EGD) WITH PROPOFOL N/A 02/23/2017   Procedure: ESOPHAGOGASTRODUODENOSCOPY (EGD) WITH PROPOFOL;  Surgeon: Danie Binder, MD;  [facility];  Service: Endoscopy;  Laterality: N/A;  8:15am   Multiple cyst removal     coccyx, left ring finger, pelvic area & throat/vocal cord and pilonidal cyst   RADIOLOGY WITH ANESTHESIA  03/23/2012   Procedure: RADIOLOGY WITH ANESTHESIA;  Surgeon: Rob Hickman, MD;  Location: [facility];  Service: Radiology;  Laterality: N/A;   TOTAL VAGINAL HYSTERECTOMY     Social History   Socioeconomic History   Marital status: Married    Spouse name: Todd   Number of children: 3   Years of education: 12   Highest education level: Not on  file  Occupational History   Occupation: unemployed    Fish farm manager: UNEMPLOYED  Tobacco Use   Smoking status: Every Day    Packs/day: 1.00    Years: 25.00    Pack years: 25.00    Types: Cigarettes   Smokeless tobacco: Never  Vaping Use   Vaping Use: Never used  Substance and Sexual Activity   Alcohol use: Yes    Comment: once a week   Drug use: No   Sexual activity: Yes    Partners: Male    Birth control/protection: Surgical    Comment: spouse  Other Topics Concern   Not on file  Social History Narrative   Lives with spouse and kids   Caffeine use:    Social Determinants of Health   Financial Resource Strain: Not on file  Food Insecurity: Not on file  Transportation Needs: Not on file  Physical Activity: Not on file  Stress: Not on file  Social Connections: Not on file   Outpatient Encounter Medications as of 03/17/2021  Medication Sig   aspirin EC 81 MG tablet Take 81 mg by mouth daily.   cabergoline (DOSTINEX) 0.5 MG tablet Take 0.5 tablets (0.25 mg total) by mouth 2 (two) times a week.   Cholecalciferol (VITAMIN D) 50 MCG (2000 UT) CAPS Take 2,000 Units by mouth daily with breakfast.   clopidogrel (PLAVIX) 75 MG tablet Take 1  tablet (75 mg total) by mouth daily.   estradiol (ESTRACE) 2 MG tablet TAKE 1 TABLET BY MOUTH ONCE DAILY.   Icosapent Ethyl (VASCEPA) 1 G CAPS Take 2 g by mouth 2 (two) times daily.    indomethacin (INDOCIN) 50 MG capsule Take 1 capsule by mouth as needed.   lansoprazole (PREVACID) 30 MG capsule TAKE 1 CAPSULE IN THE MORNING (1 HOUR BEFORE BREAKFAST)   levothyroxine (SYNTHROID) 75 MCG tablet TAKE 1/2 TABLET IN THE MORNING BEFORE BREAKFAST   Multiple Vitamin (MULTIVITAMIN) tablet Take 1 tablet by mouth daily.   naproxen sodium (ALEVE) 220 MG tablet Take 220 mg by mouth daily as needed (pain).   rosuvastatin (CRESTOR) 20 MG tablet Take 20 mg by mouth daily.   topiramate (TOPAMAX) 50 MG tablet Take 50 mg by mouth at bedtime.   No  facility-administered encounter medications on file as of 03/17/2021.   ALLERGIES: Allergies  Allergen Reactions   Pantoprazole Sodium Itching and Rash   Penicillins Itching and Rash    Has patient had a PCN reaction causing immediate rash, facial/tongue/throat swelling, SOB or lightheadedness with hypotension: Yes Has patient had a PCN reaction causing severe rash involving mucus membranes or skin necrosis: No Has patient had a PCN reaction that required hospitalization: No Has patient had a PCN reaction occurring within the last 10 years: No If all of the above answers are "NO", then may proceed with Cephalosporin use.    Sulfa Antibiotics Itching and Rash   Codeine Nausea And Vomiting   Demerol [Meperidine]     Red streaks on arm after coadministration of phenergan and demerol at time of EGD in 2012. Tolerated phenergan with subsequent EGD.    VACCINATION STATUS:  There is no immunization history on file for this patient.  HPI 50 year old female with medical history as above.     She is being seen in follow up for hypothyroidism, hyperprolactinemia/pituitary microadenoma, and history of adrenal insufficiency. -She remains on low-dose cabergoline, 0.25 mg p.o. twice weekly.  She feels better, pre-visit labs are showing control prolactin to 0.2 overall is improving from 37.6 in August 2020.     -She has no new complaints today.  She continues to have dizziness, occasional headaches.  She has not been eating well due to dental problems. She also has hypothyroidism and reports compliance with her levothyroxine start 37.5 mcg p.o. daily before breakfast.     She denies nausea, vomiting.  Denies palpitations, tremors, hot flashes.  She has steady weight. -She remains off of steroids, she denies any recent hospitalization for adrenal crisis. - She has history of leukocytosis/ thrombocytosis, bilateral carotid artery stenosis complicated by cerebrovascular accident.   Review of  Systems  Limited as above. Objective:    BP 92/64   Pulse 80   Ht 5\' 2"  (1.575 m)   Wt 92 lb (41.7 kg)   BMI 16.83 kg/m   Wt Readings from Last 3 Encounters:  03/17/21 92 lb (41.7 kg)  02/04/21 91 lb 7.9 oz (41.5 kg)  09/10/20 96 lb 6.4 oz (43.7 kg)    Physical Exam CMP     Component Value Date/Time   NA 133 (L) 10/27/2019 1116   K 4.5 10/27/2019 1116   CL 98 10/27/2019 1116   CO2 24 10/27/2019 1116   GLUCOSE 86 10/27/2019 1116   BUN 6 (L) 10/27/2019 1116   BUN 8 05/18/2017 0000   CREATININE 0.63 10/27/2019 1116   CALCIUM 9.0 10/27/2019 1116   PROT 7.0 10/27/2019  1116   ALBUMIN 3.0 (L) 12/20/2018 1117   AST 21 10/27/2019 1116   ALT 11 10/27/2019 1116   ALKPHOS 92 12/20/2018 1117   BILITOT 0.4 10/27/2019 1116   GFRNONAA 105 10/27/2019 1116   GFRAA 122 10/27/2019 1116    Recent Results (from the past 2160 hour(s))  TSH     Status: None   Collection Time: 03/12/21  8:59 AM  Result Value Ref Range   TSH 0.647 0.450 - 4.500 uIU/mL  T4, free     Status: None   Collection Time: 03/12/21  8:59 AM  Result Value Ref Range   Free T4 1.11 0.82 - 1.77 ng/dL  Prolactin     Status: Abnormal   Collection Time: 03/12/21  8:59 AM  Result Value Ref Range   Prolactin 0.2 (L) 4.8 - 23.3 ng/mL      Labs from 02/05/2016 showed TSH 1.68, free T4 0.49   Labs from 12/21/2016: TSH 1.7, free T4 0.79 (normal 0.8-1 277), sodium 140, potassium 5.4, leukocytosis at 13, normal lipid panel.  Labs from December 06, 2018: A1c 5.5%, TSH 0.83, prolactin increased 37.6  Assessment & Plan:   1. Hypothyroidism 2.  History of adrenal insufficiency ([place]) 3. Hyperprolactinemia ([place]) 4. Pituitary microadenoma ([place])   - She has long and complicated medical history including what appears to be steroid induced adrenal insufficiency which required up to 7.5 mg of prednisone daily for at least 6 years, until she decided to stop prednisone in February 2018. She denied any interval hospitalizations  or adrenal crisis.  -Given her presentation with intermittent dizziness, she will be offered a.m. cortisol repeat.  This will be done as it is possible in the next few days.  Her last a.m. cortisol was reassuring at 13.2.  - Regarding her history of hyperprolactinemia   -Her previsit labs show increase in her previsit labs show prolactin controlled at 0.2 overall improving from 37.6.  She had a chance to lower cabergoline to 0.25 mg once a week, however patient insists that she feels better on taking it twice a week.  She is advised to continue cabergoline 0.25 mg twice a week with plan to repeat labs in 6 months.     Her hyperprolactinemia  is likely related to history microadenoma (7 mm pituitary adenoma based on MRI in 2012).  Her 2014 pituitary/sella MRI was significant for 7 mm pituitary microadenoma.  She will not require any surveillance MRI of sella/pituitary this time.  Regarding her hypothyroidism: -Her previsit thyroid function tests are consistent with appropriate replacement.  She is advised to continue  levothyroxine 37.5 mcg daily before breakfast.  - We discussed about the correct intake of her thyroid hormone, on empty stomach at fasting, with water, separated by at least 30 minutes from breakfast and other medications,  and separated by more than 4 hours from calcium, iron, multivitamins, acid reflux medications (PPIs). -Patient is made aware of the fact that thyroid hormone replacement is needed for life, dose to be adjusted by periodic monitoring of thyroid function tests.  Regarding her vitamin D deficiency: she is advised to continue vitamin D 2000 units daily for maintenance.  Her screening bone density is negative for osteopia/osteoporosis.  This is a screening will be repeated every 5 years.  - I advised patient to maintain close follow up with Coolidge Breeze, FNP for primary care needs.    I spent 21 minutes in the care of the patient today including review of labs  from  Thyroid Function, CMP, and other relevant labs ; imaging/biopsy records (current and previous including abstractions from other facilities); face-to-face time discussing  her lab results and symptoms, medications doses, her options of short and long term treatment based on the latest standards of care / guidelines;   and documenting the encounter.  Juanetta Gosling  participated in the discussions, expressed understanding, and voiced agreement with the above plans.  All questions were answered to her satisfaction. she is encouraged to contact clinic should she have any questions or concerns prior to her return visit.    Follow up plan: Return in about 3 months (around 06/15/2021) for F/U with Pre-visit Labs.  Glade Lloyd, MD Phone: [phone]  Fax: [phone]  This note was partially dictated with voice recognition software. Similar sounding words can be transcribed inadequately or may not  be corrected upon review.  03/17/2021, 12:44 PM

## 2021-03-20 ENCOUNTER — Other Ambulatory Visit: Payer: Self-pay | Admitting: "Endocrinology

## 2021-03-21 LAB — CORTISOL-AM, BLOOD: Cortisol - AM: 21.4 ug/dL

## 2021-03-30 ENCOUNTER — Other Ambulatory Visit: Payer: Self-pay | Admitting: "Endocrinology

## 2021-03-31 ENCOUNTER — Other Ambulatory Visit: Payer: Self-pay | Admitting: "Endocrinology

## 2021-04-07 ENCOUNTER — Other Ambulatory Visit: Payer: Self-pay | Admitting: "Endocrinology

## 2021-04-26 ENCOUNTER — Other Ambulatory Visit: Payer: Self-pay | Admitting: "Endocrinology

## 2021-05-01 ENCOUNTER — Other Ambulatory Visit: Payer: Self-pay | Admitting: "Endocrinology

## 2021-06-11 ENCOUNTER — Other Ambulatory Visit: Payer: Self-pay | Admitting: "Endocrinology

## 2021-06-11 LAB — TSH+FREE T4
Free T4: 1 ng/dL (ref 0.8–1.8)
TSH: 2.13 mIU/L

## 2021-06-11 LAB — PROLACTIN: Prolactin: <1 ng/mL — ABNORMAL LOW

## 2021-06-16 ENCOUNTER — Ambulatory Visit: Payer: BC Managed Care – PPO | Admitting: "Endocrinology

## 2021-06-16 ENCOUNTER — Encounter: Payer: Self-pay | Admitting: "Endocrinology

## 2021-06-16 ENCOUNTER — Other Ambulatory Visit: Payer: Self-pay

## 2021-06-16 VITALS — BP 104/62 | HR 76 | Ht 62.0 in | Wt 92.2 lb

## 2021-06-16 DIAGNOSIS — E2740 Unspecified adrenocortical insufficiency: Secondary | ICD-10-CM | POA: Diagnosis not present

## 2021-06-16 DIAGNOSIS — E038 Other specified hypothyroidism: Secondary | ICD-10-CM | POA: Diagnosis not present

## 2021-06-16 DIAGNOSIS — E221 Hyperprolactinemia: Secondary | ICD-10-CM | POA: Diagnosis not present

## 2021-06-16 MED ORDER — CABERGOLINE 0.5 MG PO TABS
0.2500 mg | ORAL_TABLET | ORAL | 2 refills | Status: DC
Start: 2021-06-16 — End: 2021-09-24

## 2021-06-16 MED ORDER — LEVOTHYROXINE SODIUM 75 MCG PO TABS: ORAL_TABLET | ORAL | 1 refills | Status: DC

## 2021-06-16 NOTE — Progress Notes (Signed)
06/16/2021    Endocrinology follow-up note   Subjective:    Patient ID: [name], female    DOB: January 12, 1971, PCP Coolidge Breeze, FNP   Past Medical History:  Diagnosis Date   Bleeding ulcer    Bronchitis    hx of   Carotid artery disease ([place])    bilateral internal carotid artery occlusion by duplex ultrasound   Cervical cancer ([facility])    cervical   Chronic headaches    Common migraine with intractable migraine 02/22/2017   GERD (gastroesophageal reflux disease)    Hyperlipidemia    Hypothyroidism    Pituitary tumor    PUD (peptic ulcer disease)    NSAIDS    Stenosis of right carotid artery    Stroke ([place])    mini-stroke; memory deficits, slight   TIA (transient ischemic attack)    Past Surgical History:  Procedure Laterality Date   ABDOMINAL HYSTERECTOMY     BIOPSY  02/23/2017   Procedure: BIOPSY;  Surgeon: Danie Binder, MD;  [facility];  Service: Endoscopy;;  duodenal bx's   BLADDER SUSPENSION     ESOPHAGOGASTRODUODENOSCOPY  02/2010   Dr. Barney Drain. Multiple duodenal ulcers, gastric erosions (bx negative for H.Pylori)   ESOPHAGOGASTRODUODENOSCOPY N/A 06/19/2013   SLF: 1. Earlene Plater /dyspepsia due to uncontrolled GERD, & NSAID gastritis/duodenitis.   ESOPHAGOGASTRODUODENOSCOPY (EGD) WITH PROPOFOL N/A 02/23/2017   Procedure: ESOPHAGOGASTRODUODENOSCOPY (EGD) WITH PROPOFOL;  Surgeon: Danie Binder, MD;  [facility];  Service: Endoscopy;  Laterality: N/A;  8:15am   Multiple cyst removal     coccyx, left ring finger, pelvic area & throat/vocal cord and pilonidal cyst   RADIOLOGY WITH ANESTHESIA  03/23/2012   Procedure: RADIOLOGY WITH ANESTHESIA;  Surgeon: Rob Hickman, MD;  Location: [place];  Service: Radiology;  Laterality: N/A;   TOTAL VAGINAL HYSTERECTOMY     Social History   Socioeconomic History   Marital status: Married    Spouse name: Todd   Number of children: 3   Years of education: 12   Highest education level: Not on  file  Occupational History   Occupation: unemployed    Fish farm manager: UNEMPLOYED  Tobacco Use   Smoking status: Every Day    Packs/day: 1.00    Years: 25.00    Pack years: 25.00    Types: Cigarettes   Smokeless tobacco: Never  Vaping Use   Vaping Use: Never used  Substance and Sexual Activity   Alcohol use: Yes    Comment: once a week   Drug use: No   Sexual activity: Yes    Partners: Male    Birth control/protection: Surgical    Comment: spouse  Other Topics Concern   Not on file  Social History Narrative   Lives with spouse and kids   Caffeine use:    Social Determinants of Health   Financial Resource Strain: Not on file  Food Insecurity: Not on file  Transportation Needs: Not on file  Physical Activity: Not on file  Stress: Not on file  Social Connections: Not on file   Outpatient Encounter Medications as of 06/16/2021  Medication Sig   aspirin EC 81 MG tablet Take 81 mg by mouth daily.   cabergoline (DOSTINEX) 0.5 MG tablet Take 0.5 tablets (0.25 mg total) by mouth 2 (two) times a week.   Cholecalciferol (VITAMIN D) 50 MCG (2000 UT) CAPS Take 2,000 Units by mouth daily with breakfast.   clopidogrel (PLAVIX) 75 MG tablet Take 1  tablet (75 mg total) by mouth daily.   estradiol (ESTRACE) 2 MG tablet TAKE 1 TABLET BY MOUTH ONCE DAILY.   Icosapent Ethyl (VASCEPA) 1 G CAPS Take 2 g by mouth 2 (two) times daily.    indomethacin (INDOCIN) 50 MG capsule Take 1 capsule by mouth as needed.   lansoprazole (PREVACID) 30 MG capsule TAKE 1 CAPSULE IN THE MORNING (1 HOUR BEFORE BREAKFAST)   levothyroxine (SYNTHROID) 75 MCG tablet Take 1/2 tablet daily before breakfast   Multiple Vitamin (MULTIVITAMIN) tablet Take 1 tablet by mouth daily.   naproxen sodium (ALEVE) 220 MG tablet Take 220 mg by mouth daily as needed (pain).   rosuvastatin (CRESTOR) 20 MG tablet Take 20 mg by mouth daily.   topiramate (TOPAMAX) 50 MG tablet Take 50 mg by mouth at bedtime.   [DISCONTINUED] cabergoline  (DOSTINEX) 0.5 MG tablet Take 0.5 tablets (0.25 mg total) by mouth 2 (two) times a week.   [DISCONTINUED] levothyroxine (SYNTHROID) 75 MCG tablet TAKE 1/2 TABLET IN THE MORNING BEFORE BREAKFAST   No facility-administered encounter medications on file as of 06/16/2021.   ALLERGIES: Allergies  Allergen Reactions   Pantoprazole Sodium Itching and Rash   Penicillins Itching and Rash    Has patient had a PCN reaction causing immediate rash, facial/tongue/throat swelling, SOB or lightheadedness with hypotension: Yes Has patient had a PCN reaction causing severe rash involving mucus membranes or skin necrosis: No Has patient had a PCN reaction that required hospitalization: No Has patient had a PCN reaction occurring within the last 10 years: No If all of the above answers are "NO", then may proceed with Cephalosporin use.    Sulfa Antibiotics Itching and Rash   Codeine Nausea And Vomiting   Demerol [Meperidine]     Red streaks on arm after coadministration of phenergan and demerol at time of EGD in 2012. Tolerated phenergan with subsequent EGD.    VACCINATION STATUS:  There is no immunization history on file for this patient.  HPI 51 year old female with medical history as above.    She is being seen in follow up for hypothyroidism, hyperprolactinemia/history microadenoma, and history of adrenal insufficiency. -She remains on low-dose cabergoline, 0.25 mg p.o. twice weekly.  She feels better, pre-visit labs are showing control prolactin to 0.2 overall is improving from 37.6 in August 2020.     -She has no new complaints today.  She also has hypothyroidism and reports compliance with her levothyroxine start 37.5 mcg p.o. daily before breakfast.     She denies nausea, vomiting.  Denies palpitations, tremors, hot flashes.  She has steady weight. -She remains off of steroids, she denies any recent hospitalization for adrenal crisis.  He has steady weight since last visit. - She has history of  leukocytosis/ thrombocytosis, bilateral carotid artery stenosis complicated by cerebrovascular accident.   Review of Systems  Limited as above. Objective:    BP 104/62    Pulse 76    Ht '5\' 2"'$  (1.575 m)    Wt 92 lb 3.2 oz (41.8 kg)    BMI 16.86 kg/m   Wt Readings from Last 3 Encounters:  06/16/21 92 lb 3.2 oz (41.8 kg)  03/17/21 92 lb (41.7 kg)  02/04/21 91 lb 7.9 oz (41.5 kg)    Physical Exam CMP     Component Value Date/Time   NA 133 (L) 10/27/2019 1116   K 4.5 10/27/2019 1116   CL 98 10/27/2019 1116   CO2 24 10/27/2019 1116   GLUCOSE 86 10/27/2019 1116  BUN 6 (L) 10/27/2019 1116   BUN 8 05/18/2017 0000   CREATININE 0.63 10/27/2019 1116   CALCIUM 9.0 10/27/2019 1116   PROT 7.0 10/27/2019 1116   ALBUMIN 3.0 (L) 12/20/2018 1117   AST 21 10/27/2019 1116   ALT 11 10/27/2019 1116   ALKPHOS 92 12/20/2018 1117   BILITOT 0.4 10/27/2019 1116   GFRNONAA 105 10/27/2019 1116   GFRAA 122 10/27/2019 1116    Recent Results (from the past 2160 hour(s))  Cortisol-am, blood     Status: None   Collection Time: 03/20/21  8:16 AM  Result Value Ref Range   Cortisol - AM 21.4 mcg/dL    Comment: Reference Range 8 a.m. (7-9 a.m.) Specimen: 4.0-22.0 .   TSH+Free T4     Status: None   Collection Time: 06/11/21  8:06 AM  Result Value Ref Range   TSH 2.13 mIU/L    Comment:           Reference Range .           > or = 20 Years  0.40-4.50 .                Pregnancy Ranges           First trimester    0.26-2.66           Second trimester   0.55-2.73           Third trimester    0.43-2.91    Free T4 1.0 0.8 - 1.8 ng/dL  Prolactin     Status: Abnormal   Collection Time: 06/11/21  8:06 AM  Result Value Ref Range   Prolactin <1.0 (L) ng/mL    Comment:             Reference Range  Females         Non-pregnant        3.0-30.0         Pregnant           10.0-209.0         Postmenopausal      2.0-20.0 . . .       Labs from 02/05/2016 showed TSH 1.68, free T4 0.49   Labs from  12/21/2016: TSH 1.7, free T4 0.79 (normal 0.8-1 277), sodium 140, potassium 5.4, leukocytosis at 13, normal lipid panel.  Labs from December 06, 2018: A1c 5.5%, TSH 0.83, prolactin increased 37.6  Assessment & Plan:   1. Hypothyroidism 2.  History of adrenal insufficiency ([place]) 3. Hyperprolactinemia ([place]) 4. Pituitary microadenoma ([place])   - She has long and complicated medical history including what appears to be steroid induced adrenal insufficiency which required up to 7.5 mg of prednisone daily for at least 6 years, until she decided to stop prednisone in February 2018. She denied any interval hospitalizations or adrenal crisis.  -Given her presentation with intermittent dizziness, she will be offered a.m. cortisol repeat.  This will be done as it is possible in the next few days.  Her last a.m. cortisol was reassuring at 13.2.  - Regarding her history of hyperprolactinemia   -Her previsit labs show increase in her previsit labs show prolactin controlled at  <0.1.   She had a chance to lower cabergoline to 0.25 mg once a week, however patient insists that she feels better on taking it twice a week.  She is advised to continue cabergoline 0.25 mg twice a week with plan to repeat labs in 6 months.  Her hyperprolactinemia  is likely related to history microadenoma (7 mm pituitary adenoma based on MRI in 2012).  Her 2014 pituitary/sella MRI was significant for 7 mm pituitary microadenoma.  She will not require any surveillance MRI of sella/pituitary this time.  Regarding her hypothyroidism: -Her previsit thyroid function tests are consistent with appropriate replacement.  She is advised to continue  levothyroxine 37.5 mcg daily before breakfast.  - We discussed about the correct intake of her thyroid hormone, on empty stomach at fasting, with water, separated by at least 30 minutes from breakfast and other medications,  and separated by more than 4 hours from calcium, iron, multivitamins,  acid reflux medications (PPIs). -Patient is made aware of the fact that thyroid hormone replacement is needed for life, dose to be adjusted by periodic monitoring of thyroid function tests.   Regarding her vitamin D deficiency: she is advised to continue vitamin D 2000 units daily for maintenance.  Her screening bone density is negative for osteopia/osteoporosis.  This is a screening will be repeated every 5 years.  - I advised patient to maintain close follow up with Coolidge Breeze, FNP for primary care needs.   I spent 21 minutes in the care of the patient today including review of labs from Thyroid Function, CMP, and other relevant labs ; imaging/biopsy records (current and previous including abstractions from other facilities); face-to-face time discussing  her lab results and symptoms, medications doses, her options of short and long term treatment based on the latest standards of care / guidelines;   and documenting the encounter.  [name]  participated in the discussions, expressed understanding, and voiced agreement with the above plans.  All questions were answered to her satisfaction. she is encouraged to contact clinic should she have any questions or concerns prior to her return visit.    Follow up plan: Return in about 6 months (around 12/17/2021) for F/U with Pre-visit Labs.  Glade Lloyd, MD Phone: [phone]  Fax: [phone]  This note was partially dictated with voice recognition software. Similar sounding words can be transcribed inadequately or may not  be corrected upon review.  06/16/2021, 1:47 PM

## 2021-09-24 ENCOUNTER — Other Ambulatory Visit: Payer: Self-pay | Admitting: "Endocrinology

## 2021-09-24 ENCOUNTER — Other Ambulatory Visit: Payer: Self-pay | Admitting: Obstetrics & Gynecology

## 2021-12-17 ENCOUNTER — Other Ambulatory Visit: Payer: Self-pay | Admitting: "Endocrinology

## 2021-12-17 ENCOUNTER — Ambulatory Visit: Payer: BC Managed Care – PPO | Admitting: "Endocrinology

## 2021-12-18 LAB — T4, FREE: Free T4: 1 ng/dL (ref 0.8–1.8)

## 2021-12-18 LAB — PROLACTIN: Prolactin: <1 ng/mL — ABNORMAL LOW

## 2021-12-18 LAB — TSH: TSH: 1.56 mIU/L

## 2021-12-22 ENCOUNTER — Encounter: Payer: Self-pay | Admitting: "Endocrinology

## 2021-12-22 ENCOUNTER — Ambulatory Visit: Payer: BC Managed Care – PPO | Admitting: "Endocrinology

## 2021-12-22 VITALS — BP 104/68 | HR 82 | Ht 62.0 in | Wt 88.2 lb

## 2021-12-22 DIAGNOSIS — E559 Vitamin D deficiency, unspecified: Secondary | ICD-10-CM

## 2021-12-22 DIAGNOSIS — E038 Other specified hypothyroidism: Secondary | ICD-10-CM | POA: Diagnosis not present

## 2021-12-22 DIAGNOSIS — E221 Hyperprolactinemia: Secondary | ICD-10-CM

## 2021-12-22 DIAGNOSIS — D352 Benign neoplasm of pituitary gland: Secondary | ICD-10-CM | POA: Diagnosis not present

## 2021-12-22 NOTE — Progress Notes (Signed)
12/22/2021    Endocrinology follow-up note   Subjective:    Patient ID: [name], female    DOB: 19-Apr-1970, PCP Coolidge Breeze, FNP   Past Medical History:  Diagnosis Date   Bleeding ulcer    Bronchitis    hx of   Carotid artery disease ([place])    bilateral internal carotid artery occlusion by duplex ultrasound   Cervical cancer ([facility])    cervical   Chronic headaches    Common migraine with intractable migraine 02/22/2017   GERD (gastroesophageal reflux disease)    Hyperlipidemia    Hypothyroidism    Pituitary tumor    PUD (peptic ulcer disease)    NSAIDS    Stenosis of right carotid artery    Stroke ([place])    mini-stroke; memory deficits, slight   TIA (transient ischemic attack)    Past Surgical History:  Procedure Laterality Date   ABDOMINAL HYSTERECTOMY     BIOPSY  02/23/2017   Procedure: BIOPSY;  Surgeon: Danie Binder, MD;  [facility];  Service: Endoscopy;;  duodenal bx's   BLADDER SUSPENSION     ESOPHAGOGASTRODUODENOSCOPY  02/2010   Dr. Barney Drain. Multiple duodenal ulcers, gastric erosions (bx negative for H.Pylori)   ESOPHAGOGASTRODUODENOSCOPY N/A 06/19/2013   SLF: 1. Earlene Plater /dyspepsia due to uncontrolled GERD, & NSAID gastritis/duodenitis.   ESOPHAGOGASTRODUODENOSCOPY (EGD) WITH PROPOFOL N/A 02/23/2017   Procedure: ESOPHAGOGASTRODUODENOSCOPY (EGD) WITH PROPOFOL;  Surgeon: Danie Binder, MD;  [facility];  Service: Endoscopy;  Laterality: N/A;  8:15am   Multiple cyst removal     coccyx, left ring finger, pelvic area & throat/vocal cord and pilonidal cyst   RADIOLOGY WITH ANESTHESIA  03/23/2012   Procedure: RADIOLOGY WITH ANESTHESIA;  Surgeon: Rob Hickman, MD;  Location: [place];  Service: Radiology;  Laterality: N/A;   TOTAL VAGINAL HYSTERECTOMY     Social History   Socioeconomic History   Marital status: Married    Spouse name: Todd   Number of children: 3   Years of education: 12   Highest education level: Not on  file  Occupational History   Occupation: unemployed    Fish farm manager: UNEMPLOYED  Tobacco Use   Smoking status: Every Day    Packs/day: 1.00    Years: 25.00    Total pack years: 25.00    Types: Cigarettes   Smokeless tobacco: Never  Vaping Use   Vaping Use: Never used  Substance and Sexual Activity   Alcohol use: Yes    Comment: once a week   Drug use: No   Sexual activity: Yes    Partners: Male    Birth control/protection: Surgical    Comment: spouse  Other Topics Concern   Not on file  Social History Narrative   Lives with spouse and kids   Caffeine use:    Social Determinants of Health   Financial Resource Strain: Not on file  Food Insecurity: Not on file  Transportation Needs: Not on file  Physical Activity: Not on file  Stress: Not on file  Social Connections: Not on file   Outpatient Encounter Medications as of 12/22/2021  Medication Sig   aspirin EC 81 MG tablet Take 81 mg by mouth daily.   cabergoline (DOSTINEX) 0.5 MG tablet Take 0.5 tablets (0.25 mg total) by mouth 2 (two) times a week.   Cholecalciferol (VITAMIN D) 50 MCG (2000 UT) CAPS Take 2,000 Units by mouth daily with breakfast.   clopidogrel (PLAVIX) 75 MG tablet Take  1 tablet (75 mg total) by mouth daily.   estradiol (ESTRACE) 2 MG tablet TAKE 1 TABLET BY MOUTH ONCE DAILY.   Icosapent Ethyl (VASCEPA) 1 G CAPS Take 2 g by mouth 2 (two) times daily.    indomethacin (INDOCIN) 50 MG capsule Take 1 capsule by mouth as needed.   lansoprazole (PREVACID) 30 MG capsule TAKE 1 CAPSULE IN THE MORNING (1 HOUR BEFORE BREAKFAST)   levothyroxine (SYNTHROID) 75 MCG tablet Take 1/2 tablet daily before breakfast   Multiple Vitamin (MULTIVITAMIN) tablet Take 1 tablet by mouth daily.   naproxen sodium (ALEVE) 220 MG tablet Take 220 mg by mouth daily as needed (pain).   rosuvastatin (CRESTOR) 20 MG tablet Take 20 mg by mouth daily.   topiramate (TOPAMAX) 50 MG tablet Take 75 mg by mouth at bedtime.   No  facility-administered encounter medications on file as of 12/22/2021.   ALLERGIES: Allergies  Allergen Reactions   Pantoprazole Sodium Itching and Rash   Penicillins Itching and Rash    Has patient had a PCN reaction causing immediate rash, facial/tongue/throat swelling, SOB or lightheadedness with hypotension: Yes Has patient had a PCN reaction causing severe rash involving mucus membranes or skin necrosis: No Has patient had a PCN reaction that required hospitalization: No Has patient had a PCN reaction occurring within the last 10 years: No If all of the above answers are "NO", then may proceed with Cephalosporin use.    Sulfa Antibiotics Itching and Rash   Codeine Nausea And Vomiting   Demerol [Meperidine]     Red streaks on arm after coadministration of phenergan and demerol at time of EGD in 2012. Tolerated phenergan with subsequent EGD.    VACCINATION STATUS:  There is no immunization history on file for this patient.  HPI 51 -year-old female with medical history as above.    She is being seen in follow up for hypothyroidism, hyperprolactinemia/history of microadenoma, and history of adrenal insufficiency. -She remains on low-dose cabergoline, 0.25 mg p.o. twice weekly.  She feels better, pre-visit labs are showing control prolactin to 0.2 overall is improving from 37.6 in August 2020.     -She has no new complaints today.  She also has hypothyroidism and reports compliance with her levothyroxine start 37.5 mcg p.o. daily before breakfast.     She denies nausea, vomiting.  Denies palpitations, tremors, hot flashes.  She has steady weight. -She remains off of steroids, she denies any recent hospitalization for adrenal crisis.  He has steady weight since last visit. - She has history of leukocytosis/ thrombocytosis, bilateral carotid artery stenosis complicated by cerebrovascular accident.  Presents for follow-up weight loss, from given low baseline of 92 pounds.  Admittedly, she  has not been eating enough food due to the fact that she was busy with taking care of a sick family member.   Review of Systems  Limited as above. Objective:    BP 104/68 (BP Location: Right Arm, Patient Position: Sitting, Cuff Size: Small)   Pulse 82   Ht '5\' 2"'$  (1.575 m)   Wt 88 lb 3.2 oz (40 kg)   BMI 16.13 kg/m   Wt Readings from Last 3 Encounters:  12/22/21 88 lb 3.2 oz (40 kg)  06/16/21 92 lb 3.2 oz (41.8 kg)  03/17/21 92 lb (41.7 kg)    Physical Exam CMP     Component Value Date/Time   NA 133 (L) 10/27/2019 1116   K 4.5 10/27/2019 1116   CL 98 10/27/2019 1116  CO2 24 10/27/2019 1116   GLUCOSE 86 10/27/2019 1116   BUN 6 (L) 10/27/2019 1116   BUN 8 05/18/2017 0000   CREATININE 0.63 10/27/2019 1116   CALCIUM 9.0 10/27/2019 1116   PROT 7.0 10/27/2019 1116   ALBUMIN 3.0 (L) 12/20/2018 1117   AST 21 10/27/2019 1116   ALT 11 10/27/2019 1116   ALKPHOS 92 12/20/2018 1117   BILITOT 0.4 10/27/2019 1116   GFRNONAA 105 10/27/2019 1116   GFRAA 122 10/27/2019 1116    Recent Results (from the past 2160 hour(s))  TSH     Status: None   Collection Time: 12/17/21  8:17 AM  Result Value Ref Range   TSH 1.56 mIU/L    Comment:           Reference Range .           > or = 20 Years  0.40-4.50 .                Pregnancy Ranges           First trimester    0.26-2.66           Second trimester   0.55-2.73           Third trimester    0.43-2.91   T4, free     Status: None   Collection Time: 12/17/21  8:17 AM  Result Value Ref Range   Free T4 1.0 0.8 - 1.8 ng/dL  Prolactin     Status: Abnormal   Collection Time: 12/17/21  8:17 AM  Result Value Ref Range   Prolactin <1.0 (L) ng/mL    Comment:             Reference Range  Females         Non-pregnant        3.0-30.0         Pregnant           10.0-209.0         Postmenopausal      2.0-20.0 . . .       Labs from 02/05/2016 showed TSH 1.68, free T4 0.49   Labs from 12/21/2016: TSH 1.7, free T4 0.79 (normal 0.8-1  277), sodium 140, potassium 5.4, leukocytosis at 13, normal lipid panel.  Labs from December 06, 2018: A1c 5.5%, TSH 0.83, prolactin increased 37.6  Assessment & Plan:   1. Hypothyroidism 2.  History of adrenal insufficiency ([place]) 3. Hyperprolactinemia ([place]) 4. Pituitary microadenoma ([place])   - She has long and complicated medical history including what appears to be steroid induced adrenal insufficiency which required up to 7.5 mg of prednisone daily for at least 6 years, until she decided to stop prednisone in February 2018. She denied any interval hospitalizations or adrenal crisis.  -AM cortisol was normal during her last visit.    - Regarding her history of hyperprolactinemia   -Her previsit labs show increase in her previsit labs show prolactin controlled at  <0.1.   She had a chance to lower cabergoline to 0.25 mg once a week, however patient insists that she feels better on taking it twice a week.  She is advised to continue cabergoline 0.25 mg twice a week with plan to repeat labs in 6 months.     Her hyperprolactinemia  is likely related to history microadenoma (7 mm pituitary adenoma based on MRI in 2012).  Her 2014 pituitary/sella MRI was significant for 7 mm pituitary microadenoma.  She will not require any  surveillance MRI of sella/pituitary this time.  Regarding her hypothyroidism: -Her previsit thyroid function tests are consistent with appropriate replacement.  She is advised to continue  levothyroxine 37.5 mcg daily before breakfast.  - We discussed about the correct intake of her thyroid hormone, on empty stomach at fasting, with water, separated by at least 30 minutes from breakfast and other medications,  and separated by more than 4 hours from calcium, iron, multivitamins, acid reflux medications (PPIs). -Patient is made aware of the fact that thyroid hormone replacement is needed for life, dose to be adjusted by periodic monitoring of thyroid function  tests.    Regarding her vitamin D deficiency: she is advised to continue vitamin D 2000 units daily for maintenance.  Her screening bone density is negative for osteopia/osteoporosis.  This is a screening will be repeated every 5 years.  He is encouraged to allow more calories in the form of unprocessed carbs, plant-based proteins, fruits and vegetables.  - I advised patient to maintain close follow up with Coolidge Breeze, FNP for primary care needs.   I spent 31 minutes in the care of the patient today including review of labs from Thyroid Function, CMP, and other relevant labs ; imaging/biopsy records (current and previous including abstractions from other facilities); face-to-face time discussing  her lab results and symptoms, medications doses, her options of short and long term treatment based on the latest standards of care / guidelines;   and documenting the encounter.  Juanetta Gosling  participated in the discussions, expressed understanding, and voiced agreement with the above plans.  All questions were answered to her satisfaction. she is encouraged to contact clinic should she have any questions or concerns prior to her return visit.    Follow up plan: Return in about 6 months (around 06/22/2022) for F/U with Pre-visit Labs.  Glade Lloyd, MD Phone: [phone]  Fax: [phone]  This note was partially dictated with voice recognition software. Similar sounding words can be transcribed inadequately or may not  be corrected upon review.  12/22/2021, 3:19 PM

## 2021-12-30 ENCOUNTER — Telehealth: Payer: Self-pay | Admitting: "Endocrinology

## 2021-12-30 DIAGNOSIS — E038 Other specified hypothyroidism: Secondary | ICD-10-CM

## 2021-12-30 MED ORDER — LEVOTHYROXINE SODIUM 75 MCG PO TABS: ORAL_TABLET | ORAL | 1 refills | Status: DC

## 2021-12-30 NOTE — Telephone Encounter (Signed)
Rx refill sent.

## 2021-12-30 NOTE — Telephone Encounter (Signed)
Pt said that pharmacy has sent 2 request for her Thyroid RX to be sent in. Patient uses [facility]

## 2022-01-20 ENCOUNTER — Telehealth: Payer: Self-pay | Admitting: "Endocrinology

## 2022-01-20 DIAGNOSIS — E221 Hyperprolactinemia: Secondary | ICD-10-CM

## 2022-01-20 NOTE — Telephone Encounter (Signed)
New message   1. Which medications need to be refilled? (please list name of each medication and dose if known) cabergoline (DOSTINEX) 0.5 MG tablet  2. Which pharmacy/location (including street and city if local pharmacy) is medication to be sent to?[place]    3. Do they need a 30 day or 90 day supply? 30 day supply

## 2022-01-21 MED ORDER — CABERGOLINE 0.5 MG PO TABS: 0.2500 mg | ORAL_TABLET | ORAL | 2 refills | Status: DC

## 2022-01-21 NOTE — Telephone Encounter (Signed)
Rx refill sent.

## 2022-04-14 ENCOUNTER — Other Ambulatory Visit: Payer: Self-pay | Admitting: "Endocrinology

## 2022-04-14 DIAGNOSIS — E221 Hyperprolactinemia: Secondary | ICD-10-CM

## 2022-06-15 ENCOUNTER — Other Ambulatory Visit: Payer: Self-pay | Admitting: "Endocrinology

## 2022-06-15 DIAGNOSIS — E038 Other specified hypothyroidism: Secondary | ICD-10-CM

## 2022-06-20 LAB — COMPREHENSIVE METABOLIC PANEL
ALT: 33 IU/L — ABNORMAL HIGH (ref 0–32)
AST: 59 IU/L — ABNORMAL HIGH (ref 0–40)
Albumin/Globulin Ratio: 1.3 (ref 1.2–2.2)
Albumin: 4.1 g/dL (ref 3.8–4.9)
Alkaline Phosphatase: 100 IU/L (ref 44–121)
BUN/Creatinine Ratio: 9 (ref 9–23)
BUN: 6 mg/dL (ref 6–24)
Bilirubin Total: <0.2 mg/dL (ref 0.0–1.2)
CO2: 19 mmol/L — ABNORMAL LOW (ref 20–29)
Calcium: 9.2 mg/dL (ref 8.7–10.2)
Chloride: 109 mmol/L — ABNORMAL HIGH (ref 96–106)
Creatinine, Ser: 0.69 mg/dL (ref 0.57–1.00)
Globulin, Total: 3.1 g/dL (ref 1.5–4.5)
Glucose: 175 mg/dL — ABNORMAL HIGH (ref 70–99)
Potassium: 4 mmol/L (ref 3.5–5.2)
Sodium: 144 mmol/L (ref 134–144)
Total Protein: 7.2 g/dL (ref 6.0–8.5)
eGFR: 105 mL/min/{1.73_m2} (ref >59)

## 2022-06-20 LAB — CORTISOL-AM, BLOOD: Cortisol - AM: 18.7 ug/dL (ref 6.2–19.4)

## 2022-06-20 LAB — PROLACTIN: Prolactin: 0.2 ng/mL — ABNORMAL LOW (ref 3.6–25.2)

## 2022-06-20 LAB — T4, FREE: Free T4: 1.25 ng/dL (ref 0.82–1.77)

## 2022-06-20 LAB — TSH: TSH: 0.304 u[IU]/mL — ABNORMAL LOW (ref 0.450–4.500)

## 2022-06-22 ENCOUNTER — Encounter: Payer: Self-pay | Admitting: "Endocrinology

## 2022-06-22 ENCOUNTER — Ambulatory Visit: Payer: BC Managed Care – PPO | Admitting: "Endocrinology

## 2022-06-22 VITALS — BP 90/58 | HR 80 | Ht 62.0 in | Wt 92.3 lb

## 2022-06-22 DIAGNOSIS — E038 Other specified hypothyroidism: Secondary | ICD-10-CM | POA: Diagnosis not present

## 2022-06-22 DIAGNOSIS — E221 Hyperprolactinemia: Secondary | ICD-10-CM

## 2022-06-22 DIAGNOSIS — D352 Benign neoplasm of pituitary gland: Secondary | ICD-10-CM

## 2022-06-22 MED ORDER — LEVOTHYROXINE SODIUM 75 MCG PO TABS: ORAL_TABLET | ORAL | 1 refills | Status: DC

## 2022-06-22 MED ORDER — CABERGOLINE 0.5 MG PO TABS: ORAL_TABLET | ORAL | 2 refills | Status: DC

## 2022-06-22 NOTE — Progress Notes (Signed)
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   Cervical cancer ([facility])    cervical   Chronic headaches    Common migraine with intractable migraine 02/22/2017   GERD (gastroesophageal reflux disease)    Hyperlipidemia    Hypothyroidism    Pituitary tumor    PUD (peptic ulcer disease)    NSAIDS    Stenosis of right carotid artery    Stroke ([place])    mini-stroke; memory deficits, slight   TIA (transient ischemic attack)    Past Surgical History:  Procedure Laterality Date   ABDOMINAL HYSTERECTOMY     BIOPSY  02/23/2017   Procedure: BIOPSY;  Surgeon: Danie Binder, MD;  Location: [facility];  Service: Endoscopy;;  duodenal bx's   BLADDER SUSPENSION     ESOPHAGOGASTRODUODENOSCOPY  02/2010   Dr. Barney Drain. Multiple duodenal ulcers, gastric erosions (bx negative for H.Pylori)   ESOPHAGOGASTRODUODENOSCOPY N/A 06/19/2013   SLF: 1. Earlene Plater /dyspepsia due to uncontrolled GERD, & NSAID gastritis/duodenitis.   ESOPHAGOGASTRODUODENOSCOPY (EGD) WITH PROPOFOL N/A 02/23/2017   Procedure: ESOPHAGOGASTRODUODENOSCOPY (EGD) WITH PROPOFOL;  Surgeon: Danie Binder, MD;  Location: [facility];  Service: Endoscopy;  Laterality: N/A;  8:15am   Multiple cyst removal     coccyx, left ring finger, pelvic area & throat/vocal cord and pilonidal cyst   RADIOLOGY WITH ANESTHESIA  03/23/2012   Procedure: RADIOLOGY WITH ANESTHESIA;  Surgeon: Rob Hickman, MD;  Location: [facility];  Service: Radiology;  Laterality: N/A;   TOTAL VAGINAL HYSTERECTOMY     Social History   Socioeconomic History   Marital status: Married    Spouse name: Todd   Number of children: 3   Years of education: 12   Highest education level: Not on  file  Occupational History   Occupation: unemployed    Fish farm manager: UNEMPLOYED  Tobacco Use   Smoking status: Every Day    Packs/day: 1.00    Years: 25.00    Total pack years: 25.00    Types: Cigarettes   Smokeless tobacco: Never  Vaping Use   Vaping Use: Never used  Substance and Sexual Activity   Alcohol use: Yes    Comment: once a week   Drug use: No   Sexual activity: Yes    Partners: Male    Birth control/protection: Surgical    Comment: spouse  Other Topics Concern   Not on file  Social History Narrative   Lives with spouse and kids   Caffeine use:    Social Determinants of Health   Financial Resource Strain: Not on file  Food Insecurity: Not on file  Transportation Needs: Not on file  Physical Activity: Not on file  Stress: Not on file  Social Connections: Not on file   Outpatient Encounter Medications as of 06/22/2022  Medication Sig   aspirin EC 81 MG tablet Take 81 mg by mouth daily.   cabergoline (DOSTINEX) 0.5 MG tablet TAKE 1/2 TABLET BY MOUTH twice WEEKLY.   Cholecalciferol (VITAMIN D) 50 MCG (2000 UT) CAPS Take 2,000 Units by mouth daily with breakfast.   clopidogrel (PLAVIX) 75 MG tablet Take 1 tablet (75 mg total) by  mouth daily.   estradiol (ESTRACE) 2 MG tablet TAKE 1 TABLET BY MOUTH ONCE DAILY.   Icosapent Ethyl (VASCEPA) 1 G CAPS Take 2 g by mouth 2 (two) times daily.    indomethacin (INDOCIN) 50 MG capsule Take 1 capsule by mouth as needed.   lansoprazole (PREVACID) 30 MG capsule TAKE 1 CAPSULE IN THE MORNING (1 HOUR BEFORE BREAKFAST)   levothyroxine (SYNTHROID) 75 MCG tablet TAKE 1/2 TABLET BY MOUTH EVERY MORNING BEFORE BREAKFAST   Multiple Vitamin (MULTIVITAMIN) tablet Take 1 tablet by mouth daily.   naproxen sodium (ALEVE) 220 MG tablet Take 220 mg by mouth daily as needed (pain).   rosuvastatin (CRESTOR) 20 MG tablet Take 20 mg by mouth daily.   topiramate (TOPAMAX) 50 MG tablet Take 75 mg by mouth at bedtime.   [DISCONTINUED] cabergoline  (DOSTINEX) 0.5 MG tablet TAKE 1/2 TABLET BY MOUTH twice WEEKLY.   [DISCONTINUED] levothyroxine (SYNTHROID) 75 MCG tablet TAKE 1/2 TABLET BY MOUTH EVERY MORNING BEFORE BREAKFAST   No facility-administered encounter medications on file as of 06/22/2022.   ALLERGIES: Allergies  Allergen Reactions   Pantoprazole Sodium Itching and Rash   Penicillins Itching and Rash    Has patient had a PCN reaction causing immediate rash, facial/tongue/throat swelling, SOB or lightheadedness with hypotension: Yes Has patient had a PCN reaction causing severe rash involving mucus membranes or skin necrosis: No Has patient had a PCN reaction that required hospitalization: No Has patient had a PCN reaction occurring within the last 10 years: No If all of the above answers are "NO", then may proceed with Cephalosporin use.    Sulfa Antibiotics Itching and Rash   Codeine Nausea And Vomiting   Demerol [Meperidine]     Red streaks on arm after coadministration of phenergan and demerol at time of EGD in 2012. Tolerated phenergan with subsequent EGD.    VACCINATION STATUS:  There is no immunization history on file for this patient.  HPI 52 -year-old female with medical history as above.    She is being seen in follow up for high thyroidism, hyperprolactinemia/history of microadenoma of the pituitary gland,  and history of adrenal insufficiency. -She remains on low-dose cabergoline, 0.25 mg p.o. twice weekly.  She feels better, pre-visit labs are showing control prolactin to 0.2 overall is improving from 37.6 in August 2020.     -She has no new complaints today.  She presents with 4 pounds weight gain compared to last visit, still with low BMI of 16.88.     She denies nausea, vomiting.  Denies palpitations, tremors, hot flashes. -She remains off of steroids, she denies any recent hospitalization for adrenal crisis.  He has steady weight since last visit. 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  Presents for follow-up weight loss, from given low baseline of 92 pounds.  Admittedly, she has not been eating enough food due to the fact that she was busy with taking care of a sick family member.   Review of Systems  Limited as above. Objective:    BP (!) 90/58   Pulse 80   Ht '5\' 2"'$  (1.575 m)   Wt 92 lb 4.8 oz (41.9 kg)   BMI 16.88 kg/m   Wt Readings from Last 3 Encounters:  06/22/22 92 lb 4.8 oz (41.9 kg)  12/22/21 88 lb 3.2 oz (40 kg)  06/16/21 92 lb 3.2 oz (41.8 kg)    Physical Exam CMP     Component  Value Date/Time   NA 144 06/16/2022 0840   K 4.0 06/16/2022 0840   CL 109 (H) 06/16/2022 0840   CO2 19 (L) 06/16/2022 0840   GLUCOSE 175 (H) 06/16/2022 0840   GLUCOSE 86 10/27/2019 1116   BUN 6 06/16/2022 0840   CREATININE 0.69 06/16/2022 0840   CREATININE 0.63 10/27/2019 1116   CALCIUM 9.2 06/16/2022 0840   PROT 7.2 06/16/2022 0840   ALBUMIN 4.1 06/16/2022 0840   AST 59 (H) 06/16/2022 0840   ALT 33 (H) 06/16/2022 0840   ALKPHOS 100 06/16/2022 0840   BILITOT <0.2 06/16/2022 0840   GFRNONAA 105 10/27/2019 1116   GFRAA 122 10/27/2019 1116    Recent Results (from the past 2160 hour(s))  Cortisol-am, blood     Status: None   Collection Time: 06/16/22  8:40 AM  Result Value Ref Range   Cortisol - AM 18.7 6.2 - 19.4 ug/dL  Comprehensive metabolic panel     Status: Abnormal   Collection Time: 06/16/22  8:40 AM  Result Value Ref Range   Glucose 175 (H) 70 - 99 mg/dL   BUN 6 6 - 24 mg/dL   Creatinine, Ser 0.69 0.57 - 1.00 mg/dL   eGFR 105 >59 mL/min/1.73   BUN/Creatinine Ratio 9 9 - 23   Sodium 144 134 - 144 mmol/L   Potassium 4.0 3.5 - 5.2 mmol/L   Chloride 109 (H) 96 - 106 mmol/L   CO2 19 (L) 20 - 29 mmol/L   Calcium 9.2 8.7 - 10.2 mg/dL   Total Protein 7.2 6.0 - 8.5 g/dL   Albumin 4.1 3.8 - 4.9 g/dL   Globulin, Total 3.1 1.5 - 4.5 g/dL   Albumin/Globulin Ratio 1.3 1.2 - 2.2    Bilirubin Total <0.2 0.0 - 1.2 mg/dL   Alkaline Phosphatase 100 44 - 121 IU/L   AST 59 (H) 0 - 40 IU/L   ALT 33 (H) 0 - 32 IU/L  Prolactin     Status: Abnormal   Collection Time: 06/16/22  8:40 AM  Result Value Ref Range   Prolactin 0.2 (L) 3.6 - 25.2 ng/mL  TSH     Status: Abnormal   Collection Time: 06/16/22  8:40 AM  Result Value Ref Range   TSH 0.304 (L) 0.450 - 4.500 uIU/mL  T4, free     Status: None   Collection Time: 06/16/22  8:40 AM  Result Value Ref Range   Free T4 1.25 0.82 - 1.77 ng/dL      Labs from 02/05/2016 showed TSH 1.68, free T4 0.49   Labs from 12/21/2016: TSH 1.7, free T4 0.79 (normal 0.8-1 277), sodium 140, potassium 5.4, leukocytosis at 13, normal lipid panel.  Labs from December 06, 2018: A1c 5.5%, TSH 0.83, prolactin increased 37.6  Assessment & Plan:   1. Hypothyroidism 2.  History of adrenal insufficiency ([place]) 3. Hyperprolactinemia ([place]) 4. Pituitary microadenoma ([place])   - She has long and complicated medical history including what appears to be steroid induced adrenal insufficiency which required up to 7.5 mg of prednisone daily for at least 6 years, until she decided to stop prednisone in February 2018. She denied any interval hospitalizations or adrenal crisis.  -No indication of adrenal insufficiency at this time.  Her blood pressure is marginal at 90/58, close to her routine blood pressure.  - Regarding her history of hyperprolactinemia   -Her previsit labs show increase in her previsit labs show prolactin controlled at  0.2.   She had a chance to  lower cabergoline to 0.25 mg once a week, however patient insists that she feels better on taking it twice a week.  She is advised to continue cabergoline 0.25 mg twice a week with plan to repeat labs in 6 months.     Her hyperprolactinemia  is likely related to history microadenoma (7 mm pituitary adenoma based on MRI in 2012).  Her 2014 pituitary/sella MRI was significant for 7 mm pituitary  microadenoma.  She will not require any surveillance MRI of sella/pituitary this time.  Regarding her hypothyroidism: -Her previsit thyroid function tests are consistent with appropriate replacement.  She is advised to continue  levothyroxine 37.5 mcg daily before breakfast.  - We discussed about the correct intake of her thyroid hormone, on empty stomach at fasting, with water, separated by at least 30 minutes from breakfast and other medications,  and separated by more than 4 hours from calcium, iron, multivitamins, acid reflux medications (PPIs). -Patient is made aware of the fact that thyroid hormone replacement is needed for life, dose to be adjusted by periodic monitoring of thyroid function tests.   Regarding her vitamin D deficiency: she is advised to continue vitamin D 2000 units daily for maintenance.  Her screening bone density is negative for osteopia/osteoporosis.   She  is encouraged to allow more calories in the form of unprocessed carbs, animal and plant-based proteins, fruits and vegetables.  - I advised patient to maintain close follow up with Coolidge Breeze, FNP for primary care needs.   I spent  21  minutes in the care of the patient today including review of labs from Thyroid Function, CMP, and other relevant labs ; imaging/biopsy records (current and previous including abstractions from other facilities); face-to-face time discussing  her lab results and symptoms, medications doses, her options of short and long term treatment based on the latest standards of care / guidelines;   and documenting the encounter.  Juanetta Gosling  participated in the discussions, expressed understanding, and voiced agreement with the above plans.  All questions were answered to her satisfaction. she is encouraged to contact clinic should she have any questions or concerns prior to her return visit.    Follow up plan: Return in about 6 months (around 12/23/2022) for Fasting Labs  in AM B4  8.  Glade Lloyd, MD Phone: [phone]  Fax: [phone]  This note was partially dictated with voice recognition software. Similar sounding words can be transcribed inadequately or may not  be corrected upon review.  06/22/2022, 5:17 PM

## 2022-09-23 ENCOUNTER — Other Ambulatory Visit: Payer: Self-pay | Admitting: Obstetrics & Gynecology

## 2022-09-23 ENCOUNTER — Other Ambulatory Visit: Payer: Self-pay | Admitting: "Endocrinology

## 2022-09-23 DIAGNOSIS — E221 Hyperprolactinemia: Secondary | ICD-10-CM

## 2022-12-06 ENCOUNTER — Other Ambulatory Visit: Payer: Self-pay | Admitting: "Endocrinology

## 2022-12-06 DIAGNOSIS — E221 Hyperprolactinemia: Secondary | ICD-10-CM

## 2022-12-24 ENCOUNTER — Encounter: Payer: Self-pay | Admitting: "Endocrinology

## 2022-12-24 ENCOUNTER — Ambulatory Visit: Payer: BC Managed Care – PPO | Admitting: "Endocrinology

## 2022-12-24 VITALS — BP 84/56 | HR 72 | Ht 62.0 in | Wt 86.6 lb

## 2022-12-24 DIAGNOSIS — D352 Benign neoplasm of pituitary gland: Secondary | ICD-10-CM | POA: Diagnosis not present

## 2022-12-24 DIAGNOSIS — E221 Hyperprolactinemia: Secondary | ICD-10-CM | POA: Diagnosis not present

## 2022-12-24 DIAGNOSIS — E038 Other specified hypothyroidism: Secondary | ICD-10-CM

## 2022-12-24 DIAGNOSIS — E559 Vitamin D deficiency, unspecified: Secondary | ICD-10-CM

## 2022-12-24 DIAGNOSIS — E2740 Unspecified adrenocortical insufficiency: Secondary | ICD-10-CM

## 2022-12-24 MED ORDER — PREDNISONE 5 MG PO TABS: 5.0000 mg | ORAL_TABLET | Freq: Every day | ORAL | 1 refills | Status: DC

## 2022-12-24 MED ORDER — CABERGOLINE 0.5 MG PO TABS: ORAL_TABLET | ORAL | 2 refills | Status: DC

## 2022-12-24 MED ORDER — LEVOTHYROXINE SODIUM 75 MCG PO TABS: ORAL_TABLET | ORAL | 1 refills | Status: DC

## 2022-12-24 NOTE — Progress Notes (Signed)
12/24/2022    Endocrinology follow-up note   Subjective:    Patient ID: [name], female    DOB: Oct 10, 1970, PCP Wilmon Pali, FNP   Past Medical History:  Diagnosis Date   Bleeding ulcer    Bronchitis    hx of   Carotid artery disease (HCC)    bilateral internal carotid artery occlusion by duplex ultrasound   Cervical cancer (HCC)    cervical   Chronic headaches    Common migraine with intractable migraine 02/22/2017   GERD (gastroesophageal reflux disease)    Hyperlipidemia    Hypothyroidism    Pituitary tumor    PUD (peptic ulcer disease)    NSAIDS    Stenosis of right carotid artery    Stroke (HCC)    mini-stroke; memory deficits, slight   TIA (transient ischemic attack)    Past Surgical History:  Procedure Laterality Date   ABDOMINAL HYSTERECTOMY     BIOPSY  02/23/2017   Procedure: BIOPSY;  Surgeon: West Bali, MD;  Location: [facility];  Service: Endoscopy;;  duodenal bx's   BLADDER SUSPENSION     ESOPHAGOGASTRODUODENOSCOPY  02/2010   Dr. Jonette Eva. Multiple duodenal ulcers, gastric erosions (bx negative for H.Pylori)   ESOPHAGOGASTRODUODENOSCOPY N/A 06/19/2013   SLF: 1. Lois Huxley /dyspepsia due to uncontrolled GERD, & NSAID gastritis/duodenitis.   ESOPHAGOGASTRODUODENOSCOPY (EGD) WITH PROPOFOL N/A 02/23/2017   Procedure: ESOPHAGOGASTRODUODENOSCOPY (EGD) WITH PROPOFOL;  Surgeon: West Bali, MD;  Location: [facility];  Service: Endoscopy;  Laterality: N/A;  8:15am   Multiple cyst removal     coccyx, left ring finger, pelvic area & throat/vocal cord and pilonidal cyst   RADIOLOGY WITH ANESTHESIA  03/23/2012   Procedure: RADIOLOGY WITH ANESTHESIA;  Surgeon: Oneal Grout, MD;  Location: [facility];  Service: Radiology;  Laterality: N/A;   TOTAL VAGINAL HYSTERECTOMY     Social History   Socioeconomic History   Marital status: Married    Spouse name: Todd   Number of children: 3   Years of education: 12   Highest education level: Not on  file  Occupational History   Occupation: unemployed    Associate Professor: UNEMPLOYED  Tobacco Use   Smoking status: Every Day    Current packs/day: 1.00    Average packs/day: 1 pack/day for 25.0 years (25.0 ttl pk-yrs)    Types: Cigarettes   Smokeless tobacco: Never  Vaping Use   Vaping status: Never Used  Substance and Sexual Activity   Alcohol use: Yes    Comment: once a week   Drug use: No   Sexual activity: Yes    Partners: Male    Birth control/protection: Surgical    Comment: spouse  Other Topics Concern   Not on file  Social History Narrative   Lives with spouse and kids   Caffeine use:    Social Determinants of Health   Financial Resource Strain: Not on file  Food Insecurity: Not on file  Transportation Needs: Not on file  Physical Activity: Not on file  Stress: Not on file  Social Connections: Unknown (08/25/2021)   [facility]   Social Network    Social Network: Not on file   Outpatient Encounter Medications as of 12/24/2022  Medication Sig   predniSONE (DELTASONE) 5 MG tablet Take 1 tablet (5 mg total) by mouth daily with breakfast.   aspirin EC 81 MG tablet Take 81 mg by mouth daily.   cabergoline (DOSTINEX) 0.5 MG tablet  TAKE 1/2 TABLET BY MOUTH twice WEEKLY.   Cholecalciferol (VITAMIN D) 50 MCG (2000 UT) CAPS Take 2,000 Units by mouth daily with breakfast.   clopidogrel (PLAVIX) 75 MG tablet Take 1 tablet (75 mg total) by mouth daily.   estradiol (ESTRACE) 2 MG tablet TAKE 1 TABLET BY MOUTH ONCE DAILY.   Icosapent Ethyl (VASCEPA) 1 G CAPS Take 2 g by mouth 2 (two) times daily.    indomethacin (INDOCIN) 50 MG capsule Take 1 capsule by mouth as needed.   lansoprazole (PREVACID) 30 MG capsule TAKE 1 CAPSULE IN THE MORNING (1 HOUR BEFORE BREAKFAST)   levothyroxine (SYNTHROID) 75 MCG tablet TAKE 1/2 TABLET BY MOUTH EVERY MORNING BEFORE BREAKFAST   Multiple Vitamin (MULTIVITAMIN) tablet Take 1 tablet by mouth daily.   naproxen sodium  (ALEVE) 220 MG tablet Take 220 mg by mouth daily as needed (pain).   rosuvastatin (CRESTOR) 20 MG tablet Take 20 mg by mouth daily.   topiramate (TOPAMAX) 50 MG tablet Take 75 mg by mouth at bedtime.   [DISCONTINUED] cabergoline (DOSTINEX) 0.5 MG tablet TAKE 1/2 TABLET BY MOUTH TWICE WEEKLY   [DISCONTINUED] levothyroxine (SYNTHROID) 75 MCG tablet TAKE 1/2 TABLET BY MOUTH EVERY MORNING BEFORE BREAKFAST   No facility-administered encounter medications on file as of 12/24/2022.   ALLERGIES: Allergies  Allergen Reactions   Pantoprazole Sodium Itching and Rash   Penicillins Itching and Rash    Has patient had a PCN reaction causing immediate rash, facial/tongue/throat swelling, SOB or lightheadedness with hypotension: Yes Has patient had a PCN reaction causing severe rash involving mucus membranes or skin necrosis: No Has patient had a PCN reaction that required hospitalization: No Has patient had a PCN reaction occurring within the last 10 years: No If all of the above answers are "NO", then may proceed with Cephalosporin use.    Sulfa Antibiotics Itching and Rash   Codeine Nausea And Vomiting   Demerol [Meperidine]     Red streaks on arm after coadministration of phenergan and demerol at time of EGD in 2012. Tolerated phenergan with subsequent EGD.    VACCINATION STATUS:  There is no immunization history on file for this patient.  HPI 52 -year-old female with medical history as above.   She is being seen in follow up for hypothyroidism, hyperprolactinemia/history of microadenoma of the pituitary gland,  and history of adrenal insufficiency. -She remains on low-dose cabergoline, 0.25 mg p.o. twice weekly.  -Her previsit labs show controlled prolactin and thyroid function test consistent with appropriate replacement.  She is currently on 37.5 mcg of levothyroxine.    -She has no new complaints today, however returns with continued weight loss to a BMI of 15.84.  She remains on Topamax  due to migraine headaches.  She states she is twice a day and does not think she is under eating. -She denies craving for salt.    She denies nausea, vomiting.  Denies palpitations, tremors, hot flashes. -She remains off of steroids, she denies any recent hospitalization for adrenal crisis.  He has steady weight since last visit. - She has history of leukocytosis/ thrombocytosis, bilateral carotid artery stenosis complicated by cerebrovascular accident.   Review of Systems  Limited as above. Objective:    BP (!) 84/56   Pulse 72   Ht 5\' 2"  (1.575 m)   Wt 86 lb 9.6 oz (39.3 kg)   BMI 15.84 kg/m   Wt Readings from Last 3 Encounters:  12/24/22 86 lb 9.6 oz (39.3 kg)  06/22/22 92  lb 4.8 oz (41.9 kg)  12/22/21 88 lb 3.2 oz (40 kg)    Physical Exam CMP     Component Value Date/Time   NA 143 12/21/2022 0856   NA 144 06/16/2022 0840   K 5.6 (H) 12/21/2022 0856   CL 111 (H) 12/21/2022 0856   CO2 22 12/21/2022 0856   GLUCOSE 106 (H) 12/21/2022 0856   BUN 6 (L) 12/21/2022 0856   BUN 6 06/16/2022 0840   CREATININE 0.68 12/21/2022 0856   CALCIUM 9.6 12/21/2022 0856   PROT 6.9 12/21/2022 0856   PROT 7.2 06/16/2022 0840   ALBUMIN 4.1 06/16/2022 0840   AST 26 12/21/2022 0856   ALT 14 12/21/2022 0856   ALKPHOS 100 06/16/2022 0840   BILITOT 0.3 12/21/2022 0856   BILITOT <0.2 06/16/2022 0840   GFRNONAA 105 10/27/2019 1116   GFRAA 122 10/27/2019 1116    Recent Results (from the past 2160 hour(s))  Prolactin     Status: Abnormal   Collection Time: 12/21/22  8:56 AM  Result Value Ref Range   Prolactin <1.0 (L) ng/mL    Comment:             Reference Range  Females         Non-pregnant        3.0-30.0         Pregnant           10.0-209.0         Postmenopausal      2.0-20.0 . . .   Comprehensive metabolic panel     Status: Abnormal   Collection Time: 12/21/22  8:56 AM  Result Value Ref Range   Glucose, Bld 106 (H) 65 - 99 mg/dL    Comment: .            Fasting reference  interval . For someone without known diabetes, a glucose value between 100 and 125 mg/dL is consistent with prediabetes and should be confirmed with a follow-up test. .    BUN 6 (L) 7 - 25 mg/dL   Creat 4.09 8.11 - 9.14 mg/dL   BUN/Creatinine Ratio 9 6 - 22 (calc)   Sodium 143 135 - 146 mmol/L   Potassium 5.6 (H) 3.5 - 5.3 mmol/L   Chloride 111 (H) 98 - 110 mmol/L    Comment: Verified by repeat analysis. .    CO2 22 20 - 32 mmol/L   Calcium 9.6 8.6 - 10.4 mg/dL   Total Protein 6.9 6.1 - 8.1 g/dL   Albumin 4.0 3.6 - 5.1 g/dL   Globulin 2.9 1.9 - 3.7 g/dL (calc)   AG Ratio 1.4 1.0 - 2.5 (calc)   Total Bilirubin 0.3 0.2 - 1.2 mg/dL   Alkaline phosphatase (APISO) 90 37 - 153 U/L   AST 26 10 - 35 U/L   ALT 14 6 - 29 U/L  T4, free     Status: None   Collection Time: 12/21/22  8:56 AM  Result Value Ref Range   Free T4 1.1 0.8 - 1.8 ng/dL  TSH     Status: None   Collection Time: 12/21/22  8:56 AM  Result Value Ref Range   TSH 1.83 mIU/L    Comment:           Reference Range .           > or = 20 Years  0.40-4.50 .                Pregnancy  Ranges           First trimester    0.26-2.66           Second trimester   0.55-2.73           Third trimester    0.43-2.91   Lipid panel     Status: Abnormal   Collection Time: 12/21/22  8:56 AM  Result Value Ref Range   Cholesterol 144 <200 mg/dL   HDL 71 > OR = 50 mg/dL   Triglycerides 161 (H) <150 mg/dL   LDL Cholesterol (Calc) 48 mg/dL (calc)    Comment: Reference range: <100 . Desirable range <100 mg/dL for primary prevention;   <70 mg/dL for patients with CHD or diabetic patients  with > or = 2 CHD risk factors. Marland Kitchen LDL-C is now calculated using the Martin-Hopkins  calculation, which is a validated novel method providing  better accuracy than the Friedewald equation in the  estimation of LDL-C.  Horald Pollen et al. Lenox Ahr. 0960;454(09): 2061-2068  (http://education.[facility].com/faq/FAQ164)    Total CHOL/HDL Ratio 2.0  <5.0 (calc)   Non-HDL Cholesterol (Calc) 73 <811 mg/dL (calc)    Comment: For patients with diabetes plus 1 major ASCVD risk  factor, treating to a non-HDL-C goal of <100 mg/dL  (LDL-C of <91 mg/dL) is considered a therapeutic  option.       Labs from 02/05/2016 showed TSH 1.68, free T4 0.49   Labs from 12/21/2016: TSH 1.7, free T4 0.79 (normal 0.8-1 277), sodium 140, potassium 5.4, leukocytosis at 13, normal lipid panel.  Labs from December 06, 2018: A1c 5.5%, TSH 0.83, prolactin increased 37.6  Assessment & Plan:   1. Hypothyroidism 2.  History of adrenal insufficiency (HCC) 3. Hyperprolactinemia (HCC) 4. 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 She denied any interval hospitalizations or adrenal crisis.  -No indication of adrenal insufficiency at this time.  Her recent a.m. cortisol was optimal, however patient presents with continued unintended weight loss and her blood pressure is marginal at 84/56.  This is closer to her baseline blood pressure.  However this patient may benefit from a low-dose glucocorticoid support.  She hesitates but accepts my recommendation of prednisone 5 mg p.o. daily at breakfast.   - Regarding her history of hyperprolactinemia   -Her previsit labs show controlled prolactin, wishes to stay on low-dose cabergoline 0.25 mg once a week.   -She could have came off of cabergoline, however patient insists that she feels better on taking it twice a week.   Her hyperprolactinemia  is likely related to history microadenoma (7 mm pituitary adenoma based on MRI in 2012).  Her 2014 pituitary/sella MRI was significant for 7 mm pituitary microadenoma.  She will not require any surveillance MRI of sella/pituitary this time.  Regarding her  hypothyroidism: -Her previsit thyroid function tests are consistent with appropriate replacement.  She is advised to continue  levothyroxine 37.5 mcg daily before breakfast.  - We discussed about the correct intake of her thyroid hormone, on empty stomach at fasting, with water, separated by at least 30 minutes from breakfast and other medications,  and separated by more than 4 hours from calcium, iron, multivitamins, acid reflux medications (PPIs). -Patient is made aware of the fact that thyroid hormone replacement is needed for  life, dose to be adjusted by periodic monitoring of thyroid function tests.   Regarding her vitamin D deficiency: she is advised to continue vitamin D 2000 units daily for maintenance.  Her screening bone density is negative for osteopia/osteoporosis.   -She is at risk of protein energy malnutrition.  She is encouraged to allow herself more carbs and protein was from animal and plant sources to support her weight.   - I advised patient to maintain close follow up with Wilmon Pali, FNP for primary care needs.   I spent  25  minutes in the care of the patient today including review of labs from Thyroid Function, CMP, and other relevant labs ; imaging/biopsy records (current and previous including abstractions from other facilities); face-to-face time discussing  her lab results and symptoms, medications doses, her options of short and long term treatment based on the latest standards of care / guidelines;   and documenting the encounter.  Miquel Dunn  participated in the discussions, expressed understanding, and voiced agreement with the above plans.  All questions were answered to her satisfaction. she is encouraged to contact clinic should she have any questions or concerns prior to her return visit.   Follow up plan: Return in about 6 months (around 06/23/2023) for F/U with Pre-visit Labs.  Marquis Lunch, MD Phone: [phone]  Fax: [phone]  This note was  partially dictated with voice recognition software. Similar sounding words can be transcribed inadequately or may not  be corrected upon review.  12/24/2022, 7:31 PM

## 2023-06-17 ENCOUNTER — Other Ambulatory Visit: Payer: Self-pay | Admitting: "Endocrinology

## 2023-06-18 LAB — PROLACTIN: Prolactin: <1 ng/mL — ABNORMAL LOW

## 2023-06-18 LAB — T4, FREE: Free T4: 1.1 ng/dL (ref 0.8–1.8)

## 2023-06-18 LAB — TSH: TSH: 0.4 m[IU]/L

## 2023-06-23 ENCOUNTER — Ambulatory Visit: Payer: BC Managed Care – PPO | Admitting: "Endocrinology

## 2023-06-23 ENCOUNTER — Encounter: Payer: Self-pay | Admitting: "Endocrinology

## 2023-06-23 VITALS — BP 98/56 | HR 88 | Ht 62.0 in | Wt 89.8 lb

## 2023-06-23 DIAGNOSIS — E559 Vitamin D deficiency, unspecified: Secondary | ICD-10-CM | POA: Diagnosis not present

## 2023-06-23 DIAGNOSIS — E038 Other specified hypothyroidism: Secondary | ICD-10-CM

## 2023-06-23 DIAGNOSIS — E221 Hyperprolactinemia: Secondary | ICD-10-CM | POA: Diagnosis not present

## 2023-06-23 MED ORDER — LEVOTHYROXINE SODIUM 75 MCG PO TABS: ORAL_TABLET | ORAL | 1 refills | Status: DC

## 2023-06-23 MED ORDER — CABERGOLINE 0.5 MG PO TABS: ORAL_TABLET | ORAL | 2 refills | Status: AC

## 2023-06-23 NOTE — Progress Notes (Signed)
 06/23/2023    Endocrinology follow-up note   Subjective:    Patient ID: [name], female    DOB: 04-16-1970, PCP Wilmon Pali, FNP   Past Medical History:  Diagnosis Date   Bleeding ulcer    Bronchitis    hx of   Carotid artery disease (HCC)    bilateral internal carotid artery occlusion by duplex ultrasound   Cervical cancer (HCC)    cervical   Chronic headaches    Common migraine with intractable migraine 02/22/2017   GERD (gastroesophageal reflux disease)    Hyperlipidemia    Hypothyroidism    Pituitary tumor    PUD (peptic ulcer disease)    NSAIDS    Stenosis of right carotid artery    Stroke (HCC)    mini-stroke; memory deficits, slight   TIA (transient ischemic attack)    Past Surgical History:  Procedure Laterality Date   ABDOMINAL HYSTERECTOMY     BIOPSY  02/23/2017   Procedure: BIOPSY;  Surgeon: West Bali, MD;  Location: [facility];  Service: Endoscopy;;  duodenal bx's   BLADDER SUSPENSION     ESOPHAGOGASTRODUODENOSCOPY  02/2010   Dr. Jonette Eva. Multiple duodenal ulcers, gastric erosions (bx negative for H.Pylori)   ESOPHAGOGASTRODUODENOSCOPY N/A 06/19/2013   SLF: 1. Lois Huxley /dyspepsia due to uncontrolled GERD, & NSAID gastritis/duodenitis.   ESOPHAGOGASTRODUODENOSCOPY (EGD) WITH PROPOFOL N/A 02/23/2017   Procedure: ESOPHAGOGASTRODUODENOSCOPY (EGD) WITH PROPOFOL;  Surgeon: West Bali, MD;  Location: [facility];  Service: Endoscopy;  Laterality: N/A;  8:15am   Multiple cyst removal     coccyx, left ring finger, pelvic area & throat/vocal cord and pilonidal cyst   RADIOLOGY WITH ANESTHESIA  03/23/2012   Procedure: RADIOLOGY WITH ANESTHESIA;  Surgeon: Oneal Grout, MD;  Location: [facility];  Service: Radiology;  Laterality: N/A;   TOTAL VAGINAL HYSTERECTOMY     Social History   Socioeconomic History   Marital status: Married    Spouse name: Todd   Number of children: 3   Years of education: 12   Highest education level: Not on  file  Occupational History   Occupation: unemployed    Associate Professor: UNEMPLOYED  Tobacco Use   Smoking status: Every Day    Current packs/day: 1.00    Average packs/day: 1 pack/day for 25.0 years (25.0 ttl pk-yrs)    Types: Cigarettes   Smokeless tobacco: Never  Vaping Use   Vaping status: Never Used  Substance and Sexual Activity   Alcohol use: Yes    Comment: once a week   Drug use: No   Sexual activity: Yes    Partners: Male    Birth control/protection: Surgical    Comment: spouse  Other Topics Concern   Not on file  Social History Narrative   Lives with spouse and kids   Caffeine use:    Social Drivers of Corporate investment banker Strain: Not on file  Food Insecurity: Not on file  Transportation Needs: Not on file  Physical Activity: Not on file  Stress: Not on file  Social Connections: Unknown (08/25/2021)   [facility] from [facility], [facility]   Social Network    Social Network: Not on file   Outpatient Encounter Medications as of 06/23/2023  Medication Sig   aspirin EC 81 MG tablet Take 81 mg by mouth daily.   cabergoline (DOSTINEX) 0.5 MG tablet TAKE 1/2 TABLET BY MOUTH twice WEEKLY.   Cholecalciferol (VITAMIN D) 50 MCG (2000 UT) CAPS Take  2,000 Units by mouth daily with breakfast.   clopidogrel (PLAVIX) 75 MG tablet Take 1 tablet (75 mg total) by mouth daily.   estradiol (ESTRACE) 2 MG tablet TAKE 1 TABLET BY MOUTH ONCE DAILY.   Icosapent Ethyl (VASCEPA) 1 G CAPS Take 2 g by mouth 2 (two) times daily.    indomethacin (INDOCIN) 50 MG capsule Take 1 capsule by mouth as needed.   lansoprazole (PREVACID) 30 MG capsule TAKE 1 CAPSULE IN THE MORNING (1 HOUR BEFORE BREAKFAST)   levothyroxine (SYNTHROID) 75 MCG tablet TAKE 1/2 TABLET BY MOUTH EVERY MORNING BEFORE BREAKFAST   mirtazapine (REMERON) 7.5 MG tablet Take 7.5 mg by mouth at bedtime.   Multiple Vitamin (MULTIVITAMIN) tablet Take 1 tablet by mouth daily.   naproxen sodium (ALEVE) 220 MG tablet Take 220 mg  by mouth daily as needed (pain).   rosuvastatin (CRESTOR) 20 MG tablet Take 20 mg by mouth daily.   topiramate (TOPAMAX) 50 MG tablet Take 75 mg by mouth at bedtime.   [DISCONTINUED] cabergoline (DOSTINEX) 0.5 MG tablet TAKE 1/2 TABLET BY MOUTH twice WEEKLY.   [DISCONTINUED] levothyroxine (SYNTHROID) 75 MCG tablet TAKE 1/2 TABLET BY MOUTH EVERY MORNING BEFORE BREAKFAST   [DISCONTINUED] predniSONE (DELTASONE) 5 MG tablet Take 1 tablet (5 mg total) by mouth daily with breakfast. (Patient not taking: Reported on 06/23/2023)   No facility-administered encounter medications on file as of 06/23/2023.   ALLERGIES: Allergies  Allergen Reactions   Pantoprazole Sodium Itching and Rash   Penicillins Itching and Rash    Has patient had a PCN reaction causing immediate rash, facial/tongue/throat swelling, SOB or lightheadedness with hypotension: Yes Has patient had a PCN reaction causing severe rash involving mucus membranes or skin necrosis: No Has patient had a PCN reaction that required hospitalization: No Has patient had a PCN reaction occurring within the last 10 years: No If all of the above answers are "NO", then may proceed with Cephalosporin use.    Sulfa Antibiotics Itching and Rash   Codeine Nausea And Vomiting   Demerol [Meperidine]     Red streaks on arm after coadministration of phenergan and demerol at time of EGD in 2012. Tolerated phenergan with subsequent EGD.    VACCINATION STATUS:  There is no immunization history on file for this patient.  HPI 53 -year-old female with medical history as above.   She is being seen in follow up for hypothyroidism, hyperprolactinemia/history of microadenoma of the pituitary gland,  and history of adrenal insufficiency. -She remains on cabergoline 0.25 mg p.o. twice a week. -Her previsit labs show controlled prolactin and thyroid function test consistent with appropriate replacement.  She is currently on 37.5 mcg of levothyroxine.    -She has no  new complaints today. -She returns with some weight gain, a good development for her.  Her BMI is stable at 16.42.   She remains on Topamax due to migraine headaches.  She describes a better appetite . -She denies craving for salt.    She denies nausea, vomiting.  Denies palpitations, tremors, hot flashes. -She remains off of steroids, she denies any recent hospitalization for adrenal crisis.  He has steady weight since last visit. - She has history of leukocytosis/ thrombocytosis, bilateral carotid artery stenosis complicated by cerebrovascular accident.   Review of Systems  Limited as above. Objective:    BP (!) 98/56   Pulse 88   Ht 5\' 2"  (1.575 m)   Wt 89 lb 12.8 oz (40.7 kg)   BMI 16.42 kg/m  Wt Readings from Last 3 Encounters:  06/23/23 89 lb 12.8 oz (40.7 kg)  12/24/22 86 lb 9.6 oz (39.3 kg)  06/22/22 92 lb 4.8 oz (41.9 kg)    Physical Exam CMP     Component Value Date/Time   NA 143 12/21/2022 0856   NA 144 06/16/2022 0840   K 5.6 (H) 12/21/2022 0856   CL 111 (H) 12/21/2022 0856   CO2 22 12/21/2022 0856   GLUCOSE 106 (H) 12/21/2022 0856   BUN 6 (L) 12/21/2022 0856   BUN 6 06/16/2022 0840   CREATININE 0.68 12/21/2022 0856   CALCIUM 9.6 12/21/2022 0856   PROT 6.9 12/21/2022 0856   PROT 7.2 06/16/2022 0840   ALBUMIN 4.1 06/16/2022 0840   AST 26 12/21/2022 0856   ALT 14 12/21/2022 0856   ALKPHOS 100 06/16/2022 0840   BILITOT 0.3 12/21/2022 0856   BILITOT <0.2 06/16/2022 0840   GFRNONAA 105 10/27/2019 1116   GFRAA 122 10/27/2019 1116    Recent Results (from the past 2160 hours)  TSH     Status: None   Collection Time: 06/17/23  8:51 AM  Result Value Ref Range   TSH 0.40 mIU/L    Comment:           Reference Range .           > or = 20 Years  0.40-4.50 .                Pregnancy Ranges           First trimester    0.26-2.66           Second trimester   0.55-2.73           Third trimester    0.43-2.91   T4, free     Status: None   Collection Time:  06/17/23  8:51 AM  Result Value Ref Range   Free T4 1.1 0.8 - 1.8 ng/dL  Prolactin     Status: Abnormal   Collection Time: 06/17/23  8:51 AM  Result Value Ref Range   Prolactin <1.0 (L) ng/mL    Comment:             Reference Range  Females         Non-pregnant        3.0-30.0         Pregnant           10.0-209.0         Postmenopausal      2.0-20.0 . . .       Labs from 02/05/2016 showed TSH 1.68, free T4 0.49   Labs from 12/21/2016: TSH 1.7, free T4 0.79 (normal 0.8-1 277), sodium 140, potassium 5.4, leukocytosis at 13, normal lipid panel.  Labs from December 06, 2018: A1c 5.5%, TSH 0.83, prolactin increased 37.6  Assessment & Plan:   1. Hypothyroidism 2.  History of adrenal insufficiency (HCC) 3. Hyperprolactinemia (HCC) 4. 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 -  Due to marginal blood pressure and persistently low BMI and that underweight range, she was given low-dose prednisone to support her adrenal function.  However, she decided not to fill this prescription for fear of side effects.  - Regarding her history of hyperprolactinemia   -  Her previsit labs show controlled prolactin, wishes to stay on low-dose cabergoline 0.25 mg once a week.   -She could have came off of cabergoline, however patient insists that she feels better on taking it twice a week.   Her hyperprolactinemia  is likely related to history microadenoma (7 mm pituitary adenoma based on MRI in 2012).  Her 2014 pituitary/sella MRI was significant for 7 mm pituitary microadenoma.  She will not require any surveillance MRI of sella/pituitary this time.  Regarding her hypothyroidism: -Her previsit thyroid function tests are consistent with appropriate replacement.  She is  advised to continue  levothyroxine 37.5 mcg daily before breakfast.  - We discussed about the correct intake of her thyroid hormone, on empty stomach at fasting, with water, separated by at least 30 minutes from breakfast and other medications,  and separated by more than 4 hours from calcium, iron, multivitamins, acid reflux medications (PPIs). -Patient is made aware of the fact that thyroid hormone replacement is needed for life, dose to be adjusted by periodic monitoring of thyroid function tests.   Regarding her vitamin D deficiency: she is advised to continue vitamin D 2000 units daily for maintenance.  Her screening bone density is negative for osteopenia/osteoporosis.   -She is at risk of protein energy malnutrition.  She is encouraged to allow herself more carbs and protein both from animal and plant sources  to support her weight.   - I advised patient to maintain close follow up with Wilmon Pali, FNP for primary care needs.   I spent  22  minutes in the care of the patient today including review of labs from Thyroid Function, CMP, and other relevant labs ; imaging/biopsy records (current and previous including abstractions from other facilities); face-to-face time discussing  her lab results and symptoms, medications doses, her options of short and long term treatment based on the latest standards of care / guidelines;   and documenting the encounter.  Miquel Dunn  participated in the discussions, expressed understanding, and voiced agreement with the above plans.  All questions were answered to her satisfaction. she is encouraged to contact clinic should she have any questions or concerns prior to her return visit.   Follow up plan: Return in about 6 months (around 12/24/2023) for F/U with Pre-visit Labs.  Marquis Lunch, MD Phone: [phone]  Fax: [phone]  This note was partially dictated with voice recognition software. Similar sounding words can be transcribed inadequately  or may not  be corrected upon review.  06/23/2023, 4:56 PM

## 2023-06-28 ENCOUNTER — Other Ambulatory Visit: Payer: Self-pay | Admitting: "Endocrinology

## 2023-06-28 DIAGNOSIS — E038 Other specified hypothyroidism: Secondary | ICD-10-CM

## 2023-09-30 ENCOUNTER — Other Ambulatory Visit: Payer: Self-pay | Admitting: Obstetrics & Gynecology

## 2023-11-12 HISTORY — PX: ESOPHAGOGASTRODUODENOSCOPY ENDOSCOPY: SHX5814

## 2023-12-15 ENCOUNTER — Other Ambulatory Visit: Payer: Self-pay | Admitting: "Endocrinology

## 2023-12-15 DIAGNOSIS — E038 Other specified hypothyroidism: Secondary | ICD-10-CM

## 2023-12-28 ENCOUNTER — Ambulatory Visit: Admitting: "Endocrinology

## 2023-12-30 ENCOUNTER — Telehealth: Payer: Self-pay | Admitting: "Endocrinology

## 2023-12-30 ENCOUNTER — Other Ambulatory Visit: Payer: Self-pay | Admitting: *Deleted

## 2023-12-30 DIAGNOSIS — E038 Other specified hypothyroidism: Secondary | ICD-10-CM

## 2023-12-30 DIAGNOSIS — E2740 Unspecified adrenocortical insufficiency: Secondary | ICD-10-CM

## 2023-12-30 DIAGNOSIS — E559 Vitamin D deficiency, unspecified: Secondary | ICD-10-CM

## 2023-12-30 DIAGNOSIS — D352 Benign neoplasm of pituitary gland: Secondary | ICD-10-CM

## 2023-12-30 DIAGNOSIS — E221 Hyperprolactinemia: Secondary | ICD-10-CM

## 2023-12-30 NOTE — Telephone Encounter (Signed)
 Labs have updated.

## 2023-12-30 NOTE — Telephone Encounter (Signed)
 Lab updated.

## 2023-12-30 NOTE — Telephone Encounter (Signed)
 Pt needs labs updated

## 2024-01-08 LAB — COMPLETE METABOLIC PANEL WITHOUT GFR
AG Ratio: 1.3 (calc) (ref 1.0–2.5)
ALT: 12 U/L (ref 6–29)
AST: 21 U/L (ref 10–35)
Albumin: 3.9 g/dL (ref 3.6–5.1)
Alkaline phosphatase (APISO): 88 U/L (ref 37–153)
BUN/Creatinine Ratio: 7 (calc) (ref 6–22)
BUN: 4 mg/dL — ABNORMAL LOW (ref 7–25)
CO2: 25 mmol/L (ref 20–32)
Calcium: 8.9 mg/dL (ref 8.6–10.4)
Chloride: 109 mmol/L (ref 98–110)
Creat: 0.59 mg/dL (ref 0.50–1.03)
Globulin: 2.9 g/dL (ref 1.9–3.7)
Glucose, Bld: 105 mg/dL — ABNORMAL HIGH (ref 65–99)
Potassium: 3.5 mmol/L (ref 3.5–5.3)
Sodium: 142 mmol/L (ref 135–146)
Total Bilirubin: 0.4 mg/dL (ref 0.2–1.2)
Total Protein: 6.8 g/dL (ref 6.1–8.1)

## 2024-01-08 LAB — TSH: TSH: 1.26 m[IU]/L

## 2024-01-08 LAB — PROLACTIN: Prolactin: <1 ng/mL — ABNORMAL LOW

## 2024-01-08 LAB — T4, FREE: Free T4: 1.1 ng/dL (ref 0.8–1.8)

## 2024-01-12 ENCOUNTER — Ambulatory Visit: Admitting: "Endocrinology

## 2024-01-12 ENCOUNTER — Encounter: Payer: Self-pay | Admitting: "Endocrinology

## 2024-01-12 VITALS — BP 112/56 | HR 68 | Ht 62.0 in | Wt 85.4 lb

## 2024-01-12 DIAGNOSIS — E559 Vitamin D deficiency, unspecified: Secondary | ICD-10-CM | POA: Diagnosis not present

## 2024-01-12 DIAGNOSIS — E221 Hyperprolactinemia: Secondary | ICD-10-CM | POA: Diagnosis not present

## 2024-01-12 DIAGNOSIS — F17200 Nicotine dependence, unspecified, uncomplicated: Secondary | ICD-10-CM | POA: Diagnosis not present

## 2024-01-12 DIAGNOSIS — E038 Other specified hypothyroidism: Secondary | ICD-10-CM

## 2024-01-12 NOTE — Progress Notes (Signed)
 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   Cervical cancer (HCC)    cervical   Chronic headaches    Common migraine with intractable migraine 02/22/2017   GERD (gastroesophageal reflux disease)    Hyperlipidemia    Hypothyroidism    Pituitary tumor    PUD (peptic ulcer disease)    NSAIDS    Stenosis of right carotid artery    Stroke (HCC)    mini-stroke; memory deficits, slight   TIA (transient ischemic attack)    Past Surgical History:  Procedure Laterality Date   ABDOMINAL HYSTERECTOMY     BIOPSY  02/23/2017   Procedure: BIOPSY;  Surgeon: Harvey Margo LITTIE, MD;  Location: [facility];  Service: Endoscopy;;  duodenal bx's   BLADDER SUSPENSION     ESOPHAGOGASTRODUODENOSCOPY  02/11/2010   Dr. Margo Harvey. Multiple duodenal ulcers, gastric erosions (bx negative for H.Pylori)   ESOPHAGOGASTRODUODENOSCOPY N/A 06/19/2013   SLF: 1. Nause /dyspepsia due to uncontrolled GERD, & NSAID gastritis/duodenitis.   ESOPHAGOGASTRODUODENOSCOPY (EGD) WITH PROPOFOL  N/A 02/23/2017   Procedure: ESOPHAGOGASTRODUODENOSCOPY (EGD) WITH PROPOFOL ;  Surgeon: Harvey Margo LITTIE, MD;  Location: [facility];  Service: Endoscopy;  Laterality: N/A;  8:15am   ESOPHAGOGASTRODUODENOSCOPY ENDOSCOPY  11/2023   Multiple cyst removal     coccyx, left ring finger, pelvic area & throat/vocal cord and pilonidal cyst   RADIOLOGY WITH ANESTHESIA  03/23/2012   Procedure: RADIOLOGY WITH ANESTHESIA;  Surgeon: Thyra MARLA Nash, MD;  Location: [facility];  Service: Radiology;  Laterality: N/A;   TOTAL VAGINAL HYSTERECTOMY     Social History   Socioeconomic History   Marital status: Married    Spouse name: Todd   Number of children: 3   Years of  education: 12   Highest education level: Not on file  Occupational History   Occupation: unemployed    Associate Professor: UNEMPLOYED  Tobacco Use   Smoking status: Every Day    Current packs/day: 1.00    Average packs/day: 1 pack/day for 25.0 years (25.0 ttl pk-yrs)    Types: Cigarettes   Smokeless tobacco: Never  Vaping Use   Vaping status: Never Used  Substance and Sexual Activity   Alcohol use: Yes    Comment: once a week   Drug use: No   Sexual activity: Yes    Partners: Male    Birth control/protection: Surgical    Comment: spouse  Other Topics Concern   Not on file  Social History Narrative   Lives with spouse and kids   Caffeine use:    Social Drivers of Corporate investment banker Strain: Not on file  Food Insecurity: Not on file  Transportation Needs: Not on file  Physical Activity: Not on file  Stress: Not on file  Social Connections: Unknown (08/25/2021)   Received from [facility]   Social Network    Social Network: Not on file   Outpatient Encounter Medications as of 01/12/2024  Medication Sig   DULoxetine (CYMBALTA) 20 MG capsule Take 20 mg by mouth daily.   aspirin  EC 81 MG tablet Take 81 mg by mouth daily.   cabergoline  (DOSTINEX ) 0.5 MG tablet TAKE 1/2  TABLET BY MOUTH twice WEEKLY.   Cholecalciferol (VITAMIN D ) 50 MCG (2000 UT) CAPS Take 2,000 Units by mouth daily with breakfast.   clopidogrel  (PLAVIX ) 75 MG tablet Take 1 tablet (75 mg total) by mouth daily.   estradiol  (ESTRACE ) 2 MG tablet TAKE ONE TABLET BY MOUTH ONCE DAILY   Icosapent  Ethyl (VASCEPA ) 1 G CAPS Take 2 g by mouth 2 (two) times daily.    indomethacin (INDOCIN) 50 MG capsule Take 1 capsule by mouth as needed.   lansoprazole  (PREVACID ) 30 MG capsule TAKE 1 CAPSULE IN THE MORNING (1 HOUR BEFORE BREAKFAST)   levothyroxine  (SYNTHROID ) 75 MCG tablet TAKE 1/2 TABLET BY MOUTH EVERY MORNING BEFORE BREAKFAST   mirtazapine (REMERON) 7.5 MG tablet Take 7.5 mg by mouth at bedtime.   Multiple Vitamin  (MULTIVITAMIN) tablet Take 1 tablet by mouth daily.   naproxen sodium (ALEVE) 220 MG tablet Take 220 mg by mouth daily as needed (pain).   rosuvastatin  (CRESTOR ) 20 MG tablet Take 20 mg by mouth daily.   topiramate  (TOPAMAX ) 50 MG tablet Take 75 mg by mouth at bedtime.   No facility-administered encounter medications on file as of 01/12/2024.   ALLERGIES: Allergies  Allergen Reactions   Pantoprazole Sodium Itching and Rash   Penicillins Itching and Rash    Has patient had a PCN reaction causing immediate rash, facial/tongue/throat swelling, SOB or lightheadedness with hypotension: Yes Has patient had a PCN reaction causing severe rash involving mucus membranes or skin necrosis: No Has patient had a PCN reaction that required hospitalization: No Has patient had a PCN reaction occurring within the last 10 years: No If all of the above answers are NO, then may proceed with Cephalosporin use.    Sulfa Antibiotics Itching and Rash   Codeine Nausea And Vomiting   Demerol  [Meperidine ]     Red streaks on arm after coadministration of phenergan  and demerol  at time of EGD in 2012. Tolerated phenergan  with subsequent EGD.    VACCINATION STATUS:  There is no immunization history on file for this patient.  HPI 53 -year-old female with medical history as above.   She is being seen in follow up for hypothyroidism, hyperprolactinemia/history of microadenoma of the pituitary gland,  and history of adrenal insufficiency. -She remains on cabergoline  0.25 mg p.o. once a week please control prolactin levels. -Her previsit labs show controlled prolactin and thyroid  function test consistent with appropriate replacement.  She is currently on 37.5 mcg of levothyroxine .    -She has no new complaints today. -She returns with some weight loss dropping her BMI to 15.6 to kilograms per meter squared.   She remains on Topamax  due to migraine headaches.  She describes a better appetite, but admittedly behind in  her proteins. -She denies craving for salt.    She denies nausea, vomiting.  Denies palpitations, tremors, hot flashes. -She remains off of steroids, she denies any recent hospitalization for adrenal crisis.  He has steady weight since last visit. - She has history of leukocytosis/ thrombocytosis, bilateral carotid artery stenosis complicated by cerebrovascular accident.   Review of Systems  Limited as above. Objective:    BP (!) 112/56   Pulse 68   Ht 5' 2 (1.575 m)   Wt 85 lb 6.4 oz (38.7 kg)   BMI 15.62 kg/m   Wt Readings from Last 3 Encounters:  01/12/24 85 lb 6.4 oz (38.7 kg)  06/23/23 89 lb 12.8 oz (40.7 kg)  12/24/22 86 lb 9.6 oz (39.3 kg)  Physical Exam CMP     Component Value Date/Time   NA 142 01/07/2024 0824   NA 144 06/16/2022 0840   K 3.5 01/07/2024 0824   CL 109 01/07/2024 0824   CO2 25 01/07/2024 0824   GLUCOSE 105 (H) 01/07/2024 0824   BUN 4 (L) 01/07/2024 0824   BUN 6 06/16/2022 0840   CREATININE 0.59 01/07/2024 0824   CALCIUM  8.9 01/07/2024 0824   PROT 6.8 01/07/2024 0824   PROT 7.2 06/16/2022 0840   ALBUMIN 4.1 06/16/2022 0840   AST 21 01/07/2024 0824   ALT 12 01/07/2024 0824   ALKPHOS 100 06/16/2022 0840   BILITOT 0.4 01/07/2024 0824   BILITOT <0.2 06/16/2022 0840   GFRNONAA 105 10/27/2019 1116   GFRAA 122 10/27/2019 1116    Recent Results (from the past 2160 hours)  Prolactin     Status: Abnormal   Collection Time: 01/07/24  8:24 AM  Result Value Ref Range   Prolactin <1.0 (L) ng/mL    Comment:             Reference Range  Females         Non-pregnant        3.0-30.0         Pregnant           10.0-209.0         Postmenopausal      2.0-20.0 . . .   TSH     Status: None   Collection Time: 01/07/24  8:24 AM  Result Value Ref Range   TSH 1.26 mIU/L    Comment:           Reference Range .           > or = 20 Years  0.40-4.50 .                Pregnancy Ranges           First trimester    0.26-2.66           Second trimester    0.55-2.73           Third trimester    0.43-2.91   T4, free     Status: None   Collection Time: 01/07/24  8:24 AM  Result Value Ref Range   Free T4 1.1 0.8 - 1.8 ng/dL  COMPLETE METABOLIC PANEL WITHOUT GFR     Status: Abnormal   Collection Time: 01/07/24  8:24 AM  Result Value Ref Range   Glucose, Bld 105 (H) 65 - 99 mg/dL    Comment: .            Fasting reference interval . For someone without known diabetes, a glucose value between 100 and 125 mg/dL is consistent with prediabetes and should be confirmed with a follow-up test. .    BUN 4 (L) 7 - 25 mg/dL   Creat 9.40 9.49 - 8.96 mg/dL   BUN/Creatinine Ratio 7 6 - 22 (calc)   Sodium 142 135 - 146 mmol/L   Potassium 3.5 3.5 - 5.3 mmol/L   Chloride 109 98 - 110 mmol/L   CO2 25 20 - 32 mmol/L   Calcium  8.9 8.6 - 10.4 mg/dL   Total Protein 6.8 6.1 - 8.1 g/dL   Albumin 3.9 3.6 - 5.1 g/dL   Globulin 2.9 1.9 - 3.7 g/dL (calc)   AG Ratio 1.3 1.0 - 2.5 (calc)   Total Bilirubin 0.4 0.2 - 1.2 mg/dL   Alkaline phosphatase (APISO)  88 37 - 153 U/L   AST 21 10 - 35 U/L   ALT 12 6 - 29 U/L      Labs from 02/05/2016 showed TSH 1.68, free T4 0.49   Labs from 12/21/2016: TSH 1.7, free T4 0.79 (normal 0.8-1 277), sodium 140, potassium 5.4, leukocytosis at 13, normal lipid panel.  Labs from December 06, 2018: A1c 5.5%, TSH 0.83, prolactin increased 37.6  Assessment & Plan:   1. Hypothyroidism 2. Hyperprolactinemia (HCC) 3. 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 -  Due to marginal blood pressure and persistently low BMI and that underweight range, she was given low-dose prednisone  to support her adrenal function.  However, she decided not to fill this prescription for fear of side effects.  A series of a.m.  cortisol levels were normal including most recently in March 2024 at 18.7.  - Regarding her history of hyperprolactinemia   -Her previsit labs show controlled prolactin, wishes to stay on low-dose cabergoline  0.25 mg once a week.   -She could have came off of cabergoline , however patient insists that she feels better on taking it twice a week.   Her hyperprolactinemia  is likely related to history microadenoma (7 mm pituitary adenoma based on MRI in 2012).  Her 2014 pituitary/sella MRI was significant for 7 mm pituitary microadenoma.  She will not require any surveillance MRI of sella/pituitary this time.  Regarding her hypothyroidism: -Her previsit thyroid  function tests are consistent with appropriate replacement.  She is advised to continue  levothyroxine  37.5 mcg daily before breakfast.   - We discussed about the correct intake of her thyroid  hormone, on empty stomach at fasting, with water , separated by at least 30 minutes from breakfast and other medications,  and separated by more than 4 hours from calcium , iron, multivitamins, acid reflux medications (PPIs). -Patient is made aware of the fact that thyroid  hormone replacement is needed for life, dose to be adjusted by periodic monitoring of thyroid  function tests.   Regarding her vitamin D  deficiency: she is advised to continue vitamin D  2000 units daily for maintenance.  Her screening bone density is negative for osteopenia/osteoporosis.  She will have repeat bone density after her next visit. -She is at risk of protein energy malnutrition.  She is encouraged to allow herself more carbs and protein both from animal and plant sources  to support her weight.  She is counseled to consider smoking cessation.  - I advised patient to maintain close follow up with Gerome Tillman CROME, FNP for primary care needs.  I spent  25  minutes in the care of the patient today including review of labs from Thyroid  Function, CMP, and other relevant labs ;  imaging/biopsy records (current and previous including abstractions from other facilities); face-to-face time discussing  her lab results and symptoms, medications doses, her options of short and long term treatment based on the latest standards of care / guidelines;   and documenting the encounter.  [name]  participated in the discussions, expressed understanding, and voiced agreement with the above plans.  All questions were answered to her satisfaction. she is encouraged to contact clinic should she have any questions or concerns prior to her return visit.   Follow up plan: Return in about 6 months (around  07/12/2024) for F/U with Pre-visit Labs.  Ranny Earl, MD Phone: [phone]  Fax: [phone]  This note was partially dictated with voice recognition software. Similar sounding words can be transcribed inadequately or may not  be corrected upon review.  01/12/2024, 4:35 PM

## 2024-07-13 ENCOUNTER — Ambulatory Visit: Admitting: "Endocrinology
